# Patient Record
Sex: Female | Born: 1950 | Hispanic: No | Smoking: Former smoker
Health system: Southern US, Community
[De-identification: ages and names within clinical notes are randomized; demographics above are authoritative.]

## PROBLEM LIST (undated history)

## (undated) DIAGNOSIS — E669 Obesity, unspecified: Secondary | ICD-10-CM

## (undated) DIAGNOSIS — G250 Essential tremor: Secondary | ICD-10-CM

## (undated) DIAGNOSIS — L649 Androgenic alopecia, unspecified: Secondary | ICD-10-CM

## (undated) DIAGNOSIS — J309 Allergic rhinitis, unspecified: Secondary | ICD-10-CM

## (undated) HISTORY — DX: Androgenic alopecia, unspecified: L64.9

## (undated) HISTORY — DX: Obesity, unspecified: E66.9

## (undated) HISTORY — DX: Allergic rhinitis, unspecified: J30.9

---

## 1998-08-09 ENCOUNTER — Other Ambulatory Visit: Admission: RE | Admit: 1998-08-09 | Discharge: 1998-08-09 | Payer: Self-pay | Admitting: Obstetrics and Gynecology

## 2006-02-28 ENCOUNTER — Encounter: Payer: Self-pay | Admitting: Internal Medicine

## 2007-11-21 ENCOUNTER — Encounter: Payer: Self-pay | Admitting: Internal Medicine

## 2007-11-21 LAB — CONVERTED CEMR LAB
Albumin: 4.5 g/dL
BUN: 19 mg/dL
Eosinophils Relative: 2.2 %
Glucose, Bld: 98 mg/dL
HCT: 40.8 %
Hemoglobin: 13.7 g/dL
Monocytes Relative: 5 %
Potassium: 4.6 meq/L
RDW: 15.1 %
Sodium: 139 meq/L
Total Protein: 7.1 g/dL

## 2008-11-12 HISTORY — PX: OTHER SURGICAL HISTORY: SHX169

## 2008-12-03 ENCOUNTER — Encounter: Payer: Self-pay | Admitting: Internal Medicine

## 2008-12-10 ENCOUNTER — Ambulatory Visit (HOSPITAL_BASED_OUTPATIENT_CLINIC_OR_DEPARTMENT_OTHER): Admission: RE | Admit: 2008-12-10 | Discharge: 2008-12-10 | Payer: Self-pay | Admitting: Orthopedic Surgery

## 2008-12-10 ENCOUNTER — Encounter (INDEPENDENT_AMBULATORY_CARE_PROVIDER_SITE_OTHER): Payer: Self-pay | Admitting: Orthopedic Surgery

## 2009-12-28 ENCOUNTER — Encounter: Payer: Self-pay | Admitting: Internal Medicine

## 2009-12-28 LAB — CONVERTED CEMR LAB
BUN: 19 mg/dL
Chloride: 102 meq/L
Creatinine, Ser: 0.8 mg/dL
Potassium: 4.5 meq/L

## 2010-06-14 ENCOUNTER — Encounter: Payer: Self-pay | Admitting: Internal Medicine

## 2010-09-13 ENCOUNTER — Encounter: Payer: Self-pay | Admitting: Internal Medicine

## 2010-09-13 LAB — CONVERTED CEMR LAB
Cholesterol: 289 mg/dL
LDL Cholesterol: 149 mg/dL

## 2010-10-23 ENCOUNTER — Ambulatory Visit: Payer: Self-pay | Admitting: Internal Medicine

## 2010-10-23 DIAGNOSIS — J309 Allergic rhinitis, unspecified: Secondary | ICD-10-CM

## 2010-10-23 DIAGNOSIS — Z9189 Other specified personal risk factors, not elsewhere classified: Secondary | ICD-10-CM | POA: Insufficient documentation

## 2010-10-26 ENCOUNTER — Encounter: Payer: Self-pay | Admitting: Internal Medicine

## 2010-11-02 ENCOUNTER — Telehealth: Payer: Self-pay | Admitting: Internal Medicine

## 2010-12-14 NOTE — Assessment & Plan Note (Signed)
Summary: NEW [employer] PT  PKG  #  STC   Vital Signs:  Patient profile:   60 year old female Height:      67 inches (170.18 cm) Weight:      190.12 pounds (86.42 kg) BMI:     29.88 O2 Sat:      95 % on Room air Temp:     98.4 degrees F (36.89 degrees C) oral Pulse rate:   64 / minute BP sitting:   110 / 72  (left arm) Cuff size:   regular  Vitals Entered By: Orlan Leavens RMA (October 23, 2010 1:56 PM)  O2 Flow:  Room air CC: New patient Is Patient Diabetic? No Pain Assessment Patient in pain? no        Primary Care Provider:  Newt Lukes MD  CC:  New patient.  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- rx'd rogaine then propecia- continues to take propecia as rogaine too messy -   overweight - follows low fat diet for same and exercises 3-4x/wk (aerobic as well as weight training) - compliance complicated by travel for employment and "enjoyment" of food  Preventive Screening-Counseling & Management  Alcohol-Tobacco     Alcohol drinks/day: <1     Alcohol type: wine     Alcohol Counseling: not indicated; use of alcohol is not excessive or problematic     Smoking Status: quit     Year Quit: 1980s     Tobacco Counseling: to remain off tobacco products  Caffeine-Diet-Exercise     Does Patient Exercise: yes     Exercise Counseling: not indicated; exercise is adequate     Depression Counseling: not indicated; screening negative for depression  Safety-Violence-Falls     Seat Belt Counseling: not indicated; patient wears seat belts     Helmet Counseling: not indicated; patient wears helmet when riding bicycle/motocycle     Firearm  Counseling: not applicable     Violence Counseling: not applicable     Fall Risk Counseling: not indicated; no significant falls noted  Current Medications (verified): 1)  Omega-3 350 Mg Caps (Omega-3 Fatty Acids) .... Take 1 By Mouth Once Daily 2)  B Complex 100  Tabs (B Complex Vitamins) .... Take 1 By Mouth Once Daily 3)  Biotin 300 Mcg Tabs (Biotin) .... Take 1 By Mouth Once Daily 4)  Propecia 1 Mg Tabs (Finasteride) .... 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  I have reviewed all other systems and they were negative.   Physical Exam  General:  overweight-appearing.  alert, well-developed, well-nourished, and cooperative to examination.    Head:  Normocephalic and atraumatic without obvious abnormalities. No apparent alopecia or balding. Eyes:  vision grossly intact; pupils equal, round and reactive to light.  conjunctiva and lids normal.    Ears:  normal pinnae bilaterally, without erythema, swelling, or tenderness to palpation. TMs clear, without effusion, or cerumen impaction. Hearing grossly normal bilaterally  Mouth:  teeth and gums in good repair; mucous membranes moist, without lesions or ulcers. oropharynx clear without exudate, no erythema.  Neck:  supple, full ROM, no masses, no  thyromegaly; no thyroid nodules or tenderness. no JVD or carotid bruits.   Lungs:  normal respiratory effort, no intercostal retractions or use of accessory muscles; normal breath sounds bilaterally - no crackles and no wheezes.    Heart:  normal rate, regular rhythm, no murmur, and no rub. BLE without edema.  Neurologic:  alert & oriented X3 and cranial nerves II-XII symetrically intact.  strength normal in all extremities, sensation intact to light touch, and gait normal. speech fluent without dysarthria or aphasia; follows commands with good comprehension.  Skin:  no rashes, vesicles, ulcers, or erythema. No nodules or irregularity to palpation.  Psych:  Oriented X3, memory intact for recent and remote, normally interactive, good eye contact, not anxious appearing, not depressed appearing, and not agitated.      Impression & Recommendations:  Problem # 1:  ALLERGIC RHINITIS (ICD-477.9)  Her updated medication list for this problem includes:    Claritin 10 Mg Tabs (Loratadine) .Marland Kitchen... 1 by mouth once daily  Problem # 2:  SPECIAL SCREENING FOR MALIGNANT NEOPLASMS COLON (ICD-V76.51)  Orders: Gastroenterology Referral (GI)  Problem # 3:  UNSPECIFIED BREAST SCREENING (ICD-V76.10)  Orders: Misc. Referral (Misc. Ref)  Problem # 4:  SPECIAL SCREENING FOR OSTEOPOROSIS (ICD-V82.81)  Orders: Radiology Referral (Radiology)  Complete Medication List: 1)  Omega-3 350 Mg Caps (Omega-3 fatty acids) .... Take 1 by mouth once daily 2)  B Complex 100 Tabs (B complex vitamins) .... Take 1 by mouth once daily 3)  Biotin 300 Mcg Tabs (Biotin) .... Take 1 by mouth once daily 4)  Propecia 1 Mg Tabs (Finasteride) .... Take 1 by mouth once daily 5)  Claritin 10 Mg Tabs (Loratadine) .Marland Kitchen.. 1 by mouth once daily 6)  Ambien 10 Mg Tabs (Zolpidem tartrate) .Marland Kitchen.. 1 by mouth at bedtime as needed  Patient Instructions: 1)  it was good to see you today. 2)  past history and medications reviewed today - no changes  suggested at this time 3)  will send for prior [facility] as discussed - 4)  we'll make referral for mammo, dexa bone density and colonoscopy. Our office will contact you regarding these appointment once made.  5)  Please schedule a follow-up appointment annually for physcial and labs, call sooner if problems.    Orders Added: 1)  New Patient Level II [99202] 2)  Gastroenterology Referral [GI] 3)  Misc. Referral [Misc. Ref] 4)  Radiology Referral [Radiology]

## 2010-12-14 NOTE — Letter (Signed)
[facility]   Imported By: Sherian Rein 11/17/2010 14:52:16  _____________________________________________________________________  External Attachment:    Type:   Image     Comment:   External Document

## 2010-12-14 NOTE — Letter (Signed)
[facility]: [facility]   Imported By: Sherian Rein 11/17/2010 14:54:00  _____________________________________________________________________  External Attachment:    Type:   Image     Comment:   External Document

## 2010-12-14 NOTE — Progress Notes (Signed)
Summary: Diet pill?  Phone Note Call from Patient Call back at [facility] Phone [phone]   Caller: Patient [phone] Summary of Call: Pt called stating she discussed with VAL at OV the possibility of starting on a diet pill but has forgotten the name. Pt called to infrom MD that she is interested in using pill and is requesting Rx [facility] Initial call taken by: Margaret Pyle, CMA,  November 02, 2010 10:41 AM  Follow-up for Phone Call        we discussed phentermine - can pick up 30d rx (unable to call in or fax due to controlled subst status) - take as directed for appetitie suppression, stop med and call if problems or SE - if pt wishes to continue sameafter 30d supply gone, will need OV for weigh in and med review before any refills will be provided - thanks Follow-up by: Newt Lukes MD,  November 02, 2010 11:13 AM  Additional Follow-up for Phone Call Additional follow up Details #1::        pt advised Rx in cabinet for pt pick up Additional Follow-up by: Margaret Pyle, CMA,  November 02, 2010 11:23 AM    New/Updated Medications: PHENTERMINE HCL 37.5 MG CAPS (PHENTERMINE HCL) 1 by mouth once daily Prescriptions: PHENTERMINE HCL 37.5 MG CAPS (PHENTERMINE HCL) 1 by mouth once daily  #30 x 0   Entered and Authorized by:   Newt Lukes MD   Signed by:   Newt Lukes MD on 11/02/2010   Method used:   Print then Give to Patient   RxID:   [id number]

## 2010-12-14 NOTE — Progress Notes (Signed)
Summary: Rx refill req  Phone Note Refill Request Message from:  Patient on November 02, 2010 11:27 AM  Refills Requested: Medication #1:  AMBIEN 10 MG TABS 1 by mouth at bedtime as needed   Dosage confirmed as above?Dosage Confirmed   Supply Requested: 3 months   Notes: [facility] [address]  Method Requested: Electronic Initial call taken by: Margaret Pyle, CMA,  November 02, 2010 11:28 AM  Follow-up for Phone Call        ok to send #30 with 3 refills - thanks Follow-up by: Newt Lukes MD,  November 02, 2010 11:56 AM    Prescriptions: AMBIEN 10 MG TABS (ZOLPIDEM TARTRATE) 1 by mouth at bedtime as needed  #30 x 3   Entered and Authorized by:   Margaret Pyle, CMA   Signed by:   Margaret Pyle, CMA on 11/02/2010   Method used:   Telephoned to ...       [facility]  [phone]* (retail)       [address] [place], [place]  [address]       Ph: [phone] or [phone]       Fax: [phone]   RxID:   [id number] AMBIEN 10 MG TABS (ZOLPIDEM TARTRATE) 1 by mouth at bedtime as needed  #30 x 3   Entered by:   Margaret Pyle, CMA   Authorized by:   Newt Lukes MD   Signed by:   Margaret Pyle, CMA on 11/02/2010   Method used:   Printed then faxed to ...       [facility]  [phone]* (retail)       [address] [place], [place]  [address]       Ph: [phone] or [phone]       Fax: [phone]   RxID:   [id number]

## 2011-01-01 ENCOUNTER — Ambulatory Visit: Payer: Self-pay | Admitting: Internal Medicine

## 2011-01-05 ENCOUNTER — Encounter (INDEPENDENT_AMBULATORY_CARE_PROVIDER_SITE_OTHER): Payer: Self-pay | Admitting: *Deleted

## 2011-01-05 ENCOUNTER — Ambulatory Visit (INDEPENDENT_AMBULATORY_CARE_PROVIDER_SITE_OTHER): Payer: Managed Care, Other (non HMO) | Admitting: Internal Medicine

## 2011-01-05 ENCOUNTER — Encounter: Payer: Self-pay | Admitting: Internal Medicine

## 2011-01-05 DIAGNOSIS — E669 Obesity, unspecified: Secondary | ICD-10-CM

## 2011-01-05 DIAGNOSIS — E663 Overweight: Secondary | ICD-10-CM | POA: Insufficient documentation

## 2011-01-05 DIAGNOSIS — J309 Allergic rhinitis, unspecified: Secondary | ICD-10-CM

## 2011-01-09 ENCOUNTER — Encounter: Payer: Self-pay | Admitting: Internal Medicine

## 2011-01-09 NOTE — Assessment & Plan Note (Signed)
Summary: FU ON WEIGHT /NWS #   Vital Signs:  Patient profile:   60 year old female Weight:      181.0 pounds O2 Sat:      96 % on Room air Temp:     98.2 degrees F oral Pulse rate:   74 / minute BP sitting:   110 / 84  (left arm)  Vitals Entered By: Orlan Leavens RMA (January 05, 2011 9:33 AM)  O2 Flow:  Room air CC: follow-up visit Is Patient Diabetic? No Pain Assessment Patient in pain? no        Primary Care Provider:  Newt Lukes MD  CC:  follow-up visit.  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- rx'd rogaine then propecia- continues to take propecia as rogaine too messy -   overweight - follows low fat diet for same and exercises 3-4x/wk (aerobic as well as weight training) - compliance complicated by travel for employment and "enjoyment" of food  Clinical Review Panels:  Lipid Management   Cholesterol:  291 (11/21/2007)   LDL (bad choesterol):  140 (11/21/2007)   HDL (good cholesterol):  142 (11/21/2007)   Triglycerides:  43 (11/21/2007)  CBC   WBC:  5.3 (11/21/2007)   RBC:  4.59 (11/21/2007)   Hgb:  13.7 (11/21/2007)   Hct:  40.8 (11/21/2007)   Platelets:  343 (11/21/2007)   MCV  88.9 (11/21/2007)   RDW  15.1 (11/21/2007)   PMN:  64 (11/21/2007)   Monos:  5.0 (11/21/2007)   Eosinophils:  2.2 (11/21/2007)   Basophil:  0.4 (11/21/2007)  Complete Metabolic Panel   Glucose:  94 (12/28/2009)   Sodium:  138 (12/28/2009)   Potassium:  4.5 (12/28/2009)   Chloride:  102 (12/28/2009)  CO2:  29 (12/28/2009)   BUN:  19 (12/28/2009)   Creatinine:  0.8 (12/28/2009)   Albumin:  4.5 (11/21/2007)   Total Protein:  7.1 (11/21/2007)   Calcium:  10.1 (12/28/2009)   Total Bili:  0.9 (11/21/2007)   Alk Phos:  51 (11/21/2007)   SGPT (ALT):  26 (11/21/2007)   SGOT (AST):  20 (11/21/2007)   Current Medications (verified): 1)  Omega-3 350 Mg Caps (Omega-3 Fatty Acids) .... Take 1 By Mouth Once Daily 2)  B Complex 100  Tabs (B Complex Vitamins) .... Take 1 By Mouth Once Daily 3)  Biotin 300 Mcg Tabs (Biotin) .... Take 1 By Mouth Once Daily 4)  Claritin 10 Mg Tabs (Loratadine) .Marland Kitchen.. 1 By Mouth Once Daily 5)  Ambien 10 Mg Tabs (Zolpidem Tartrate) .Marland Kitchen.. 1 By Mouth At Bedtime As Needed 6)  Phentermine Hcl 37.5 Mg Caps (Phentermine Hcl) .Marland Kitchen.. 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    Physical Exam  General:  overweight-appearing.  alert, well-developed, well-nourished, and cooperative to examination.    Lungs:  normal respiratory effort, no intercostal retractions or use of accessory muscles; normal breath sounds bilaterally - no crackles and no wheezes.    Heart:  normal rate, regular rhythm, no murmur, and no rub. BLE without edema.  Psych:  Oriented X3, memory intact for recent and remote, normally interactive, good eye contact, not anxious appearing, not depressed appearing, and not agitated.      Impression & Recommendations:  Problem # 1:  OBESITY (ICD-278.00)  Ht: 67 (10/23/2010)   Wt: 181.0 (01/05/2011)   BMI: 29.88 (10/23/2010)  started phentermine 11/03/11 - down 9# from start (190#) tol well - cont same - refill reviewed need for continued diet/exercise lifestyle change to sustain change max 3 mo rx  reviewed - pt understands and agrees Time spent with patient 25 minutes, more than 50% of this time was spent counseling patient on weight loss, med tx and travel ideas to encourage compliance  Problem # 2:  ALLERGIC RHINITIS (ICD-477.9) rec nasal sailne as needed as well for dry nose symptoms with weather and heated air Her updated medication list for this problem includes:    Claritin 10 Mg Tabs (Loratadine) .Marland Kitchen... 1 by mouth once daily  Complete Medication List: 1)  Omega-3 350 Mg Caps (Omega-3 fatty acids) .... Take 1 by mouth once daily 2)  B Complex 100 Tabs (B complex vitamins) .... Take 1 by mouth once daily 3)  Biotin 300 Mcg Tabs (Biotin) .... Take 1 by mouth once daily 4)  Claritin 10 Mg Tabs (Loratadine) .Marland Kitchen.. 1 by mouth once daily 5)  Ambien 10 Mg Tabs (Zolpidem tartrate) .Marland Kitchen.. 1 by mouth at bedtime as needed 6)  Phentermine Hcl 37.5 Mg Caps (Phentermine hcl) .Marland Kitchen.. 1 by mouth once daily  Patient Instructions: 1)  it was good to see you today. 2)  continue medication as discussed and keep up the good work - refill provided today - call if problems 3)  use nasal saline spray as needed for dryness 4)  will send to [facility] for records 5)  Please schedule a follow-up appointment in 1-2 months to review weightloss and med treatment, sooner if problems.  Prescriptions: PHENTERMINE HCL 37.5 MG CAPS (PHENTERMINE HCL) 1 by mouth once daily  #30 x 0   Entered and Authorized by:   Newt Lukes MD   Signed by:   Newt Lukes MD on 01/05/2011   Method used:   Print then Give to Patient   RxID:   [id number]    Orders Added: 1)  Est. Patient Level IV [30865]

## 2011-01-09 NOTE — Letter (Signed)
Summary: Referral - not able to see patient  [facility]  [address] [place], [place] [address]   Phone: [phone]  Fax: [phone]    January 05, 2011    Vikki Ports A. Felicity Coyer, M.D. [address] [place], [place] [address]    Re:   Heather Rivera DOB:  [birth year] MRN:   [id number]    Dear Dr. Felicity Coyer:  Thank you for your kind referral of the above patient.  We have attempted to schedule the recommended procedure Screening Colonoscopy but have not been able to schedule because:   X  The patient was not available by phone and/or has not returned our calls.  ___ The patient declined to schedule the procedure at this time.  We appreciate the referral and hope that we will have the opportunity to treat this patient in the future.    Sincerely,    [facility] [phone]

## 2011-02-22 ENCOUNTER — Encounter: Payer: Self-pay | Admitting: Internal Medicine

## 2011-02-26 LAB — POCT HEMOGLOBIN-HEMACUE: Hemoglobin: 12.9 g/dL (ref 12.0–15.0)

## 2011-02-27 ENCOUNTER — Encounter: Payer: Self-pay | Admitting: Internal Medicine

## 2011-02-27 ENCOUNTER — Ambulatory Visit (INDEPENDENT_AMBULATORY_CARE_PROVIDER_SITE_OTHER): Payer: Managed Care, Other (non HMO) | Admitting: Internal Medicine

## 2011-02-27 VITALS — BP 112/82 | HR 82 | Temp 97.5°F | Ht 69.0 in | Wt 175.0 lb

## 2011-02-27 DIAGNOSIS — E669 Obesity, unspecified: Secondary | ICD-10-CM

## 2011-02-27 MED ORDER — PHENTERMINE HCL 37.5 MG PO TABS: 37.5000 mg | ORAL_TABLET | Freq: Every day | ORAL | Status: DC

## 2011-02-27 NOTE — Assessment & Plan Note (Signed)
started phentermine 11/03/11 - down 15# from start (190#) tol well - cont same - refill reviewed need for continued diet/exercise lifestyle change to sustain change max 3 mo rx reviewed - pt understands and agrees Time spent with patient 25 minutes, more than 50% of this time was spent counseling patient on weight loss, med tx and travel ideas to encourage compliance

## 2011-02-27 NOTE — Progress Notes (Signed)
  Subjective:    Patient ID: Heather Rivera, female    DOB: 01-02-1951, 60 y.o.   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 OBESITY   . Female pattern baldness   . ALLERGIC RHINITIS     Review of Systems  Respiratory: Negative for shortness of breath.   Cardiovascular: Negative for chest pain.  Neurological: Negative for numbness.       Objective:   Physical Exam  Constitutional: She appears well-developed and well-nourished. No distress.  Cardiovascular: Normal rate, regular rhythm and normal heart sounds.   No murmur heard. Pulmonary/Chest: Effort normal and breath sounds normal. No respiratory distress. She has no wheezes.   BP 112/82  Pulse 82  Temp(Src) 97.5 F (36.4 C) (Oral)  Ht 5\' 9"  (1.753 m)  Wt 175 lb (79.379 kg)  BMI 25.84 kg/m2  SpO2 99% Wt Readings from Last 3 Encounters:  02/27/11 175 lb (79.379 kg)  01/05/11 181 lb (82.101 kg)  10/23/10 190 lb 1.9 oz (86.237 kg)        Assessment & Plan:  See problem list. Medications and labs reviewed today.

## 2011-02-27 NOTE — Patient Instructions (Signed)
It was good to see you today. refill on medication today - call if problems - keep up the good work Please schedule followup in 6-12 months, call sooner if problems. Schedule your PAP/pelvic - let us know if you need referral

## 2011-03-01 ENCOUNTER — Encounter: Payer: Self-pay | Admitting: Internal Medicine

## 2011-03-27 NOTE — Op Note (Signed)
Heather Rivera, Heather Rivera               ACCOUNT NO.:  [id number]   MEDICAL RECORD NO.:  [id number]          PATIENT TYPE:  AMB   LOCATION:  [facility]                          FACILITY:  [facility]   PHYSICIAN:  Cindee Salt, M.D.       DATE OF BIRTH:  Mar 01, 1951   DATE OF PROCEDURE:  12/10/2008  DATE OF DISCHARGE:                               OPERATIVE REPORT   PREOPERATIVE DIAGNOSIS:  Foreign body granuloma right index finger.   POSTOPERATIVE DIAGNOSIS:  Foreign body granuloma right index finger.   OPERATION:  Excision foreign body granuloma right index finger.   SURGEON:  Cindee Salt, MD   ANESTHESIA:  Forearm based IV regional with supplementation.   ANESTHESIOLOGIST:  Burna Forts, MD   HISTORY:  The patient is a 60 year old female with a history of a  swollen area of her index finger following a laceration with breakage of  a light bulb which she was attempting to place at Christmas.  X-rays  reveal a retained foreign body piece of glass.  She is desirous of  having this excised.  Pre, peri and postoperative course have been  discussed along with risks and complications.  She is aware that there  is no guarantee with surgery, possibility of infection, recurrence  injury to arteries, nerves, tendons, incomplete relief of symptoms, and  dystrophy.  In the preoperative area, the patient is seen.  The  extremity marked by both the patient and surgeon.  Antibiotic given.   PROCEDURE:  The patient is brought to the operating room where forearm  based IV regional anesthesia was carried out without difficulty.  Under  the direction of Dr. Jacklynn Bue, she was prepped using DuraPrep, supine  position, right arm free.  A time-out was taken confirming the patient  and procedure.  Following this, an oblique incision was made directly  over the area of the mass and carried down through subcutaneous tissue.  A piece of glass was immediately encountered.  This had paint on  portions of it.  This was  removed without difficulty.  A foreign body  granuloma had formed around the base of it with blunt and sharp  dissection and this was dissected free  identifying the neurovascular  structure which was protected.  It was external to the flexor tendons.  This was removed in toto and sent to pathology.  The wound was copiously  irrigated with saline.  The lateral portions of the wound were closed  with interrupted 4-0 Vicryl sutures.  A metacarpal block was given with  0.25% Marcaine without epinephrine.  A sterile compressive dressing and  splint to the finger applied.  The patient tolerated the procedure well.  On deflation of the tourniquet, the finger immediately pinked.  She was  taken to the recovery room for observation in satisfactory condition.  She will be discharged home to return to the [facility]  in 1 week on Vicodin.           ______________________________  Cindee Salt, M.D.    GK/MEDQ  D:  12/10/2008  T:  12/11/2008  Job:  [id number] 

## 2011-09-24 ENCOUNTER — Telehealth: Payer: Self-pay | Admitting: *Deleted

## 2011-09-24 MED ORDER — ZOLPIDEM TARTRATE 10 MG PO TABS: 10.0000 mg | ORAL_TABLET | Freq: Every evening | ORAL | Status: DC | PRN

## 2011-09-24 NOTE — Telephone Encounter (Signed)
Ok refill done 

## 2011-09-24 NOTE — Telephone Encounter (Signed)
Zolpidem request [last refill 12.22.11 #30x3 last OV 04.17.12]

## 2011-12-10 ENCOUNTER — Other Ambulatory Visit: Payer: Self-pay | Admitting: Endocrinology

## 2011-12-10 ENCOUNTER — Encounter: Payer: Self-pay | Admitting: Internal Medicine

## 2011-12-10 ENCOUNTER — Other Ambulatory Visit: Payer: Self-pay | Admitting: Internal Medicine

## 2011-12-10 ENCOUNTER — Ambulatory Visit (INDEPENDENT_AMBULATORY_CARE_PROVIDER_SITE_OTHER): Payer: Managed Care, Other (non HMO) | Admitting: Internal Medicine

## 2011-12-10 VITALS — BP 104/72 | HR 80 | Temp 97.8°F | Resp 20 | Wt 180.5 lb

## 2011-12-10 DIAGNOSIS — J209 Acute bronchitis, unspecified: Secondary | ICD-10-CM

## 2011-12-10 MED ORDER — MOXIFLOXACIN HCL 400 MG PO TABS: 400.0000 mg | ORAL_TABLET | Freq: Every day | ORAL | Status: DC

## 2011-12-10 MED ORDER — PSEUDOEPHEDRINE-CODEINE-GG 30-10-100 MG/5ML PO SOLN: 10.0000 mL | Freq: Four times a day (QID) | ORAL | Status: AC | PRN

## 2011-12-10 MED ORDER — BENZONATATE 200 MG PO CAPS
200.0000 mg | ORAL_CAPSULE | Freq: Three times a day (TID) | ORAL | Status: DC | PRN
Start: 2011-12-10 — End: 2011-12-10

## 2011-12-10 MED ORDER — LEVOFLOXACIN 500 MG PO TABS: 500.0000 mg | ORAL_TABLET | Freq: Every day | ORAL | Status: AC

## 2011-12-10 NOTE — Patient Instructions (Signed)

## 2011-12-10 NOTE — Telephone Encounter (Signed)
Tessalon as needed - erx done - if fever or worsening symptoms - needs OV to determine if antibiotics necessary - thanks

## 2011-12-10 NOTE — Progress Notes (Signed)
Subjective:    Patient ID: Heather Rivera, female    DOB: Jun 02, 1951, 61 y.o.   MRN: [id number]  URI  This is a new problem. Episode onset: for 2 weeks. The problem has been gradually worsening. There has been no fever. Associated symptoms include congestion, coughing, rhinorrhea and sinus pain. Pertinent negatives include no abdominal pain, chest pain, diarrhea, dysuria, ear pain, headaches, joint pain, joint swelling, nausea, neck pain, plugged ear sensation, rash, sneezing, sore throat, swollen glands, vomiting or wheezing. She has tried nothing for the symptoms.      Review of Systems  Constitutional: Negative for fever, chills, diaphoresis, activity change, appetite change, fatigue and unexpected weight change.  HENT: Positive for nosebleeds, congestion, rhinorrhea, postnasal drip and sinus pressure. Negative for ear pain, sore throat, facial swelling, sneezing, trouble swallowing, neck pain and voice change.   Eyes: Negative.   Respiratory: Positive for cough. Negative for chest tightness, shortness of breath, wheezing and stridor.   Cardiovascular: Negative for chest pain, palpitations and leg swelling.  Gastrointestinal: Negative for nausea, vomiting, abdominal pain, diarrhea, constipation and anal bleeding.  Genitourinary: Negative for dysuria, urgency, frequency, hematuria, flank pain, decreased urine volume, enuresis, difficulty urinating and dyspareunia.  Musculoskeletal: Negative for myalgias, back pain, joint pain, joint swelling, arthralgias and gait problem.  Skin: Negative for color change, pallor, rash and wound.  Neurological: Negative for dizziness, tremors, seizures, syncope, facial asymmetry, speech difficulty, weakness, light-headedness, numbness and headaches.  Hematological: Negative for adenopathy. Does not bruise/bleed easily.  Psychiatric/Behavioral: Negative.        Objective:   Physical Exam  Vitals reviewed. Constitutional: She is oriented to person, place,  and time. She appears well-developed and well-nourished. No distress.  HENT:  Head: Normocephalic and atraumatic. No trismus in the jaw.  Right Ear: Hearing, tympanic membrane, external ear and ear canal normal.  Left Ear: Hearing, tympanic membrane, external ear and ear canal normal.  Nose: Mucosal edema and rhinorrhea present. No nose lacerations, sinus tenderness, nasal deformity, septal deviation or nasal septal hematoma. No epistaxis.  No foreign bodies. Right sinus exhibits maxillary sinus tenderness. Right sinus exhibits no frontal sinus tenderness. Left sinus exhibits maxillary sinus tenderness. Left sinus exhibits no frontal sinus tenderness.  Mouth/Throat: Uvula is midline, oropharynx is clear and moist and mucous membranes are normal. Mucous membranes are not pale, not dry and not cyanotic. No uvula swelling. No oropharyngeal exudate, posterior oropharyngeal edema, posterior oropharyngeal erythema or tonsillar abscesses.  Eyes: Conjunctivae are normal. Right eye exhibits no discharge. Left eye exhibits no discharge. No scleral icterus.  Neck: Normal range of motion. Neck supple. No JVD present. No tracheal deviation present. No thyromegaly present.  Cardiovascular: Normal rate, regular rhythm, normal heart sounds and intact distal pulses.  Exam reveals no gallop and no friction rub.   No murmur heard. Pulmonary/Chest: Effort normal and breath sounds normal. No stridor. No respiratory distress. She has no wheezes. She has no rales. She exhibits no tenderness.  Abdominal: Soft. Bowel sounds are normal. She exhibits no distension and no mass. There is no tenderness. There is no rebound and no guarding.  Musculoskeletal: Normal range of motion. She exhibits no edema and no tenderness.  Lymphadenopathy:    She has no cervical adenopathy.  Neurological: She is oriented to person, place, and time.  Skin: Skin is warm and dry. No rash noted. She is not diaphoretic. No erythema. No pallor.    Psychiatric: She has a normal mood and affect. Her behavior is normal. Judgment and  thought content normal.          Assessment & Plan:

## 2011-12-10 NOTE — Telephone Encounter (Signed)
Notified Heather Rivera with md response. Heather Rivera states she has made appt to see Dr. Yetta Barre this pm since she is leaving going out of town tomorrow...12/10/11@3 :23pm/LMB

## 2011-12-10 NOTE — Telephone Encounter (Signed)
The pt called and stated she has had a cough for two weeks.  She is hoping something can be called into the pharmacy for cough symptoms.    Thanks!

## 2011-12-10 NOTE — Assessment & Plan Note (Signed)
Start avelox for the infection and a symptom reliever

## 2012-01-04 ENCOUNTER — Ambulatory Visit: Payer: Managed Care, Other (non HMO) | Admitting: Internal Medicine

## 2012-01-04 DIAGNOSIS — Z0289 Encounter for other administrative examinations: Secondary | ICD-10-CM

## 2012-07-28 ENCOUNTER — Other Ambulatory Visit: Payer: Self-pay | Admitting: General Practice

## 2012-07-28 MED ORDER — ZOLPIDEM TARTRATE 10 MG PO TABS: 10.0000 mg | ORAL_TABLET | Freq: Every evening | ORAL | Status: DC | PRN

## 2012-07-28 NOTE — Telephone Encounter (Signed)
Received refill request from [facility]  request refill for ZOLPIDEM TARTRATE 10mg  . Rx last written 09-24-11 and pt last seen 12/10/11  . Please advise. Thanks

## 2012-07-28 NOTE — Telephone Encounter (Signed)
ok 

## 2012-07-29 NOTE — Telephone Encounter (Signed)
Rx faxed to pharmacy  

## 2012-09-23 LAB — LIPID PANEL: Cholesterol: 277 mg/dL — AB (ref 0–200)

## 2013-04-14 ENCOUNTER — Telehealth: Payer: Self-pay | Admitting: *Deleted

## 2013-04-14 DIAGNOSIS — Z0000 Encounter for general adult medical examination without abnormal findings: Secondary | ICD-10-CM

## 2013-04-14 NOTE — Telephone Encounter (Signed)
Received staff msg pt made cpx entering cpx labs...lmb 

## 2013-04-14 NOTE — Telephone Encounter (Signed)
Message copied by Deatra James on Tue Apr 14, 2013  1:28 PM ------      Message from: Livingston Diones      Created: Tue Apr 14, 2013  9:25 AM       Pt has a cpe 06/08/13. Please put in lab work a week prior.  ------

## 2013-06-08 ENCOUNTER — Encounter: Payer: Self-pay | Admitting: Internal Medicine

## 2013-06-08 ENCOUNTER — Ambulatory Visit (INDEPENDENT_AMBULATORY_CARE_PROVIDER_SITE_OTHER): Payer: Managed Care, Other (non HMO) | Admitting: Internal Medicine

## 2013-06-08 ENCOUNTER — Other Ambulatory Visit (INDEPENDENT_AMBULATORY_CARE_PROVIDER_SITE_OTHER): Payer: Managed Care, Other (non HMO)

## 2013-06-08 VITALS — BP 140/90 | HR 62 | Temp 97.2°F | Ht 69.0 in | Wt 182.0 lb

## 2013-06-08 DIAGNOSIS — E669 Obesity, unspecified: Secondary | ICD-10-CM

## 2013-06-08 DIAGNOSIS — F418 Other specified anxiety disorders: Secondary | ICD-10-CM

## 2013-06-08 DIAGNOSIS — Z1211 Encounter for screening for malignant neoplasm of colon: Secondary | ICD-10-CM

## 2013-06-08 DIAGNOSIS — Z1239 Encounter for other screening for malignant neoplasm of breast: Secondary | ICD-10-CM

## 2013-06-08 DIAGNOSIS — Z0000 Encounter for general adult medical examination without abnormal findings: Secondary | ICD-10-CM

## 2013-06-08 DIAGNOSIS — F411 Generalized anxiety disorder: Secondary | ICD-10-CM

## 2013-06-08 LAB — CBC WITH DIFFERENTIAL/PLATELET
Basophils Absolute: 0 10*3/uL (ref 0.0–0.1)
Eosinophils Absolute: 0.1 10*3/uL (ref 0.0–0.7)
Lymphocytes Relative: 27.4 % (ref 12.0–46.0)
MCHC: 33.8 g/dL (ref 30.0–36.0)
MCV: 87.9 fl (ref 78.0–100.0)
Monocytes Absolute: 0.5 10*3/uL (ref 0.1–1.0)
Neutro Abs: 4.1 10*3/uL (ref 1.4–7.7)
Neutrophils Relative %: 63 % (ref 43.0–77.0)
RDW: 13.5 % (ref 11.5–14.6)

## 2013-06-08 LAB — BASIC METABOLIC PANEL
CO2: 30 mEq/L (ref 19–32)
Calcium: 9.9 mg/dL (ref 8.4–10.5)
Chloride: 100 mEq/L (ref 96–112)
Creatinine, Ser: 0.8 mg/dL (ref 0.4–1.2)
Glucose, Bld: 95 mg/dL (ref 70–99)

## 2013-06-08 LAB — TSH: TSH: 2.74 u[IU]/mL (ref 0.35–5.50)

## 2013-06-08 LAB — HEPATIC FUNCTION PANEL
AST: 21 U/L (ref 0–37)
Alkaline Phosphatase: 46 U/L (ref 39–117)
Bilirubin, Direct: 0.1 mg/dL (ref 0.0–0.3)
Total Bilirubin: 0.9 mg/dL (ref 0.3–1.2)

## 2013-06-08 MED ORDER — PHENTERMINE HCL 37.5 MG PO TBDP: 37.5000 mg | ORAL_TABLET | Freq: Every day | ORAL | Status: DC

## 2013-06-08 NOTE — Progress Notes (Signed)
Subjective:    Patient ID: Heather Rivera, female    DOB: 02-21-1951, 62 y.o.   MRN: [id number]  HPI patient is here today for annual physical. Patient feels well overall.  Concerned about weight gain and ?resume med for appetite suppressant? Working on diet/exercise  Also increase anxiety related to employment uncertainty, discord with relationship with eldest dtr and death of mom in past 12months. infreq use of meds for sleep - declines need for other meds or counseling at this time  Past Medical History  Diagnosis Date  . OBESITY   . Female pattern baldness   . ALLERGIC RHINITIS    Family History  Problem Relation Age of Onset  . Alcohol abuse Mother   . Arthritis Other     grandparent   History  Substance Use Topics  . Smoking status: Former Smoker    Quit date: 11/12/1978  . Smokeless tobacco: Not on file     Comment: Divorced, single and lives alone. 2 grown kids. 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 Alcohol Use: No     Review of Systems Constitutional: Negative for fever or weight change.  Respiratory: Negative for cough and shortness of breath.   Cardiovascular: Negative for chest pain or palpitations.  Gastrointestinal: Negative for abdominal pain, no bowel changes.  Musculoskeletal: Negative for gait problem or joint swelling.  Skin: Negative for rash.  Neurological: Negative for dizziness or headache.  No other specific complaints in a complete review of systems (except as listed in HPI above).     Objective:   Physical Exam BP 140/90  Pulse 62  Temp(Src) 97.2 F (36.2 C) (Oral)  Ht 5\' 9"  (1.753 m)  Wt 182 lb (82.555 kg)  BMI 26.86 kg/m2  SpO2 97% Wt Readings from Last 3 Encounters:  06/08/13 182 lb (82.555 kg)  12/10/11 180 lb 8 oz (81.874 kg)  02/27/11 175 lb (79.379 kg)   Constitutional: She is overweight, but appears well-developed and well-nourished. No distress.  HENT: Head: Normocephalic and atraumatic. Ears: B  TMs Heather, no erythema or effusion; Nose: Nose normal. Mouth/Throat: Oropharynx is clear and moist. No oropharyngeal exudate.  Eyes: Conjunctivae and EOM are normal. Pupils are equal, round, and reactive to light. No scleral icterus.  Neck: Normal range of motion. Neck supple. No JVD present. No thyromegaly present.  Cardiovascular: Normal rate, regular rhythm and normal heart sounds.  No murmur heard. No BLE edema. Pulmonary/Chest: Effort normal and breath sounds normal. No respiratory distress. She has no wheezes.  Abdominal: Soft. Bowel sounds are normal. She exhibits no distension. There is no tenderness. no masses Musculoskeletal: Normal range of motion, no joint effusions. No gross deformities Neurological: She is alert and oriented to person, place, and time. No cranial nerve deficit. Coordination, balance, strength, speech and gait are normal.  Skin: Skin is warm and dry. No rash noted. No erythema.  Psychiatric: She has an anxious/dysphoric and emotional mood/affect. Her behavior is normal. Judgment and thought content normal.   Lab Results  Component Value Date   WBC 5.3 11/21/2007   HGB 12.9 12/10/2008   HCT 40.8 11/21/2007   PLT 343 11/21/2007   GLUCOSE 100 09/13/2010   CHOL 277* 09/23/2012   HDL 130* 09/23/2012   LDLCALC 149 09/13/2010   ALT 26 11/21/2007   AST 20 11/21/2007   NA 138 12/28/2009   K 4.5 12/28/2009   CL 102 12/28/2009   CREATININE 0.8 12/28/2009   BUN 19 12/28/2009   CO2  29 12/28/2009   ECG: normal sinus at 66 beats per minutes. Low voltage in precordial leads but no acute ischemic change, no arrhythmia    Assessment & Plan:   CPX/v70.0 0- Patient has been counseled on age-appropriate routine health concerns for screening and prevention. These are reviewed and up-to-date. Immunizations are up-to-date or declined. Labs and ECG reviewed.  Situational depression/anxiety - exacerbated by social and family/employment stressors and death of mom in past year. Support offered at  length. Previously in counseling, ongoing "talk therapy" and biofeedback, declines need for meds to control symptoms at this time. Pt agrees to call if worse or unimproved, and/or stop appetite suppressant if symptoms worse on stimulant therapy   Also See problem list. Medications and labs reviewed today.

## 2013-06-08 NOTE — Patient Instructions (Addendum)
It was good to see you today. We have reviewed your prior records including labs and tests today Health Maintenance reviewed - refer for screening mammography and for colonoscopy with Dr. Loreta Ave -all other recommended immunizations and age-appropriate screenings are up-to-date. Test(s) ordered today. Your results will be released to MyChart (or called to you) after review, usually within 72hours after test completion. If any changes need to be made, you will be notified at that same time. Medications reviewed and updated, okay to resume phentermine as needed to help with weight loss goals -no other changes recommended at this time.  Monitor your blood pressure and call if remains greater than 140/90 If referral needed for counseling or increasing anxiety symptoms, stop phentermine and call for further direction as needed followup in 3 months for review and blood pressure recheck, call sooner if problemsHealth Maintenance, Females A healthy lifestyle and preventative care can promote health and wellness.  Maintain regular health, dental, and eye exams.  Eat a healthy diet. Foods like vegetables, fruits, whole grains, low-fat dairy products, and lean protein foods contain the nutrients you need without too many calories. Decrease your intake of foods high in solid fats, added sugars, and salt. Get information about a proper diet from your caregiver, if necessary.  Regular physical exercise is one of the most important things you can do for your health. Most adults should get at least 150 minutes of moderate-intensity exercise (any activity that increases your heart rate and causes you to sweat) each week. In addition, most adults need muscle-strengthening exercises on 2 or more days a week.   Maintain a healthy weight. The body mass index (BMI) is a screening tool to identify possible weight problems. It provides an estimate of body fat based on height and weight. Your caregiver can help determine your  BMI, and can help you achieve or maintain a healthy weight. For adults 20 years and older:  A BMI below 18.5 is considered underweight.  A BMI of 18.5 to 24.9 is normal.  A BMI of 25 to 29.9 is considered overweight.  A BMI of 30 and above is considered obese.  Maintain normal blood lipids and cholesterol by exercising and minimizing your intake of saturated fat. Eat a balanced diet with plenty of fruits and vegetables. Blood tests for lipids and cholesterol should begin at age 57 and be repeated every 5 years. If your lipid or cholesterol levels are high, you are over 50, or you are a high risk for heart disease, you may need your cholesterol levels checked more frequently.Ongoing high lipid and cholesterol levels should be treated with medicines if diet and exercise are not effective.  If you smoke, find out from your caregiver how to quit. If you do not use tobacco, do not start.  If you are pregnant, do not drink alcohol. If you are breastfeeding, be very cautious about drinking alcohol. If you are not pregnant and choose to drink alcohol, do not exceed 1 drink per day. One drink is considered to be 12 ounces (355 mL) of beer, 5 ounces (148 mL) of wine, or 1.5 ounces (44 mL) of liquor.  Avoid use of street drugs. Do not share needles with anyone. Ask for help if you need support or instructions about stopping the use of drugs.  High blood pressure causes heart disease and increases the risk of stroke. Blood pressure should be checked at least every 1 to 2 years. Ongoing high blood pressure should be treated with medicines, if  weight loss and exercise are not effective.  If you are 73 to 64 years old, ask your caregiver if you should take aspirin to prevent strokes.  Diabetes screening involves taking a blood sample to check your fasting blood sugar level. This should be done once every 3 years, after age 75, if you are within normal weight and without risk factors for diabetes. Testing  should be considered at a younger age or be carried out more frequently if you are overweight and have at least 1 risk factor for diabetes.  Breast cancer screening is essential preventative care for women. You should practice "breast self-awareness." This means understanding the normal appearance and feel of your breasts and may include breast self-examination. Any changes detected, no matter how small, should be reported to a caregiver. Women in their 18s and 30s should have a clinical breast exam (CBE) by a caregiver as part of a regular health exam every 1 to 3 years. After age 44, women should have a CBE every year. Starting at age 22, women should consider having a mammogram (breast X-ray) every year. Women who have a family history of breast cancer should talk to their caregiver about genetic screening. Women at a high risk of breast cancer should talk to their caregiver about having an MRI and a mammogram every year.  The Pap test is a screening test for cervical cancer. Women should have a Pap test starting at age 106. Between ages 33 and 62, Pap tests should be repeated every 2 years. Beginning at age 38, you should have a Pap test every 3 years as long as the past 3 Pap tests have been normal. If you had a hysterectomy for a problem that was not cancer or a condition that could lead to cancer, then you no longer need Pap tests. If you are between ages 29 and 61, and you have had normal Pap tests going back 10 years, you no longer need Pap tests. If you have had past treatment for cervical cancer or a condition that could lead to cancer, you need Pap tests and screening for cancer for at least 20 years after your treatment. If Pap tests have been discontinued, risk factors (such as a new sexual partner) need to be reassessed to determine if screening should be resumed. Some women have medical problems that increase the chance of getting cervical cancer. In these cases, your caregiver may recommend more  frequent screening and Pap tests.  The human papillomavirus (HPV) test is an additional test that may be used for cervical cancer screening. The HPV test looks for the virus that can cause the cell changes on the cervix. The cells collected during the Pap test can be tested for HPV. The HPV test could be used to screen women aged 59 years and older, and should be used in women of any age who have unclear Pap test results. After the age of 27, women should have HPV testing at the same frequency as a Pap test.  Colorectal cancer can be detected and often prevented. Most routine colorectal cancer screening begins at the age of 49 and continues through age 46. However, your caregiver may recommend screening at an earlier age if you have risk factors for colon cancer. On a yearly basis, your caregiver may provide home test kits to check for hidden blood in the stool. Use of a small camera at the end of a tube, to directly examine the colon (sigmoidoscopy or colonoscopy), can detect the  earliest forms of colorectal cancer. Talk to your caregiver about this at age 18, when routine screening begins. Direct examination of the colon should be repeated every 5 to 10 years through age 6, unless early forms of pre-cancerous polyps or small growths are found.  Hepatitis C blood testing is recommended for all people born from [birth year] through 1967 and any individual with known risks for hepatitis C.  Practice safe sex. Use condoms and avoid high-risk sexual practices to reduce the spread of sexually transmitted infections (STIs). Sexually active women aged 16 and younger should be checked for Chlamydia, which is a common sexually transmitted infection. Older women with new or multiple partners should also be tested for Chlamydia. Testing for other STIs is recommended if you are sexually active and at increased risk.  Osteoporosis is a disease in which the bones lose minerals and strength with aging. This can result in  serious bone fractures. The risk of osteoporosis can be identified using a bone density scan. Women ages 48 and over and women at risk for fractures or osteoporosis should discuss screening with their caregivers. Ask your caregiver whether you should be taking a calcium supplement or vitamin D to reduce the rate of osteoporosis.  Menopause can be associated with physical symptoms and risks. Hormone replacement therapy is available to decrease symptoms and risks. You should talk to your caregiver about whether hormone replacement therapy is right for you.  Use sunscreen with a sun protection factor (SPF) of 30 or greater. Apply sunscreen liberally and repeatedly throughout the day. You should seek shade when your shadow is shorter than you. Protect yourself by wearing long sleeves, pants, a wide-brimmed hat, and sunglasses year round, whenever you are outdoors.  Notify your caregiver of new moles or changes in moles, especially if there is a change in shape or color. Also notify your caregiver if a mole is larger than the size of a pencil eraser.  Stay current with your immunizations. Document Released: 05/14/2011 Document Revised: 01/21/2012 Document Reviewed: 05/14/2011 [facility] Patient Information 2014 [place], [place]. Exercise to Lose Weight Exercise and a healthy diet may help you lose weight. Your doctor may suggest specific exercises. EXERCISE IDEAS AND TIPS  Choose low-cost things you enjoy doing, such as walking, bicycling, or exercising to workout videos.  Take stairs instead of the elevator.  Walk during your lunch break.  Park your car further away from work or school.  Go to a gym or an exercise class.  Start with 5 to 10 minutes of exercise each day. Build up to 30 minutes of exercise 4 to 6 days a week.  Wear shoes with good support and comfortable clothes.  Stretch before and after working out.  Work out until you breathe harder and your heart beats faster.  Drink  extra water when you exercise.  Do not do so much that you hurt yourself, feel dizzy, or get very short of breath. Exercises that burn about 150 calories:  Running 1  miles in 15 minutes.  Playing volleyball for 45 to 60 minutes.  Washing and waxing a car for 45 to 60 minutes.  Playing touch football for 45 minutes.  Walking 1  miles in 35 minutes.  Pushing a stroller 1  miles in 30 minutes.  Playing basketball for 30 minutes.  Raking leaves for 30 minutes.  Bicycling 5 miles in 30 minutes.  Walking 2 miles in 30 minutes.  Dancing for 30 minutes.  Shoveling snow for 15 minutes.  Swimming  laps for 20 minutes.  Walking up stairs for 15 minutes.  Bicycling 4 miles in 15 minutes.  Gardening for 30 to 45 minutes.  Jumping rope for 15 minutes.  Washing windows or floors for 45 to 60 minutes. Document Released: 12/01/2010 Document Revised: 01/21/2012 Document Reviewed: 12/01/2010 [facility] Patient Information 2014 [place], [place].

## 2013-06-08 NOTE — Assessment & Plan Note (Signed)
started phentermine 11/03/11 - used intermittently for 3 months - down 15# from start (190#) reviewed need for continued diet/exercise lifestyle change to sustain change Ok to resume same, max 3 mo rx reviewed - pt understands and agrees

## 2013-07-20 ENCOUNTER — Other Ambulatory Visit: Payer: Self-pay | Admitting: Internal Medicine

## 2013-07-20 NOTE — Telephone Encounter (Signed)
Notified pt rx ready for pick-up.../lmb 

## 2013-09-28 ENCOUNTER — Other Ambulatory Visit: Payer: Self-pay | Admitting: Internal Medicine

## 2013-09-28 NOTE — Telephone Encounter (Signed)
Ok to refill? Last OV 7.28.14 Last filled 7.28.14

## 2013-09-28 NOTE — Telephone Encounter (Signed)
Done hardcopy to robin  

## 2013-09-29 ENCOUNTER — Ambulatory Visit (INDEPENDENT_AMBULATORY_CARE_PROVIDER_SITE_OTHER): Payer: Managed Care, Other (non HMO) | Admitting: Family

## 2013-09-29 ENCOUNTER — Encounter: Payer: Self-pay | Admitting: Family

## 2013-09-29 VITALS — BP 120/80 | HR 81 | Temp 98.3°F | Wt 174.0 lb

## 2013-09-29 DIAGNOSIS — J069 Acute upper respiratory infection, unspecified: Secondary | ICD-10-CM

## 2013-09-29 DIAGNOSIS — J029 Acute pharyngitis, unspecified: Secondary | ICD-10-CM

## 2013-09-29 MED ORDER — METHYLPREDNISOLONE 4 MG PO KIT: PACK | ORAL | Status: AC

## 2013-09-29 NOTE — Progress Notes (Signed)
Subjective:    Patient ID: Heather Rivera, female    DOB: 07/27/1951, 62 y.o.   MRN: [id number]  HPI 62 year old white female, patient of Dr. Felicity Coyer is in today with complaints of sore throat x1 day. She has taken and he'll do help some. Accompanying symptoms include sneezing, congestion, fatigue. Reports illness and her daughter and grandchild.   Review of Systems  Constitutional: Negative.   HENT: Positive for postnasal drip, rhinorrhea, sneezing and sore throat.   Respiratory: Negative.   Cardiovascular: Negative.   Gastrointestinal: Negative.   Skin: Negative.   Allergic/Immunologic: Negative.   Hematological: Negative.  Negative for adenopathy.  Psychiatric/Behavioral: Negative.    Past Medical History  Diagnosis Date  . OBESITY   . Female pattern baldness   . ALLERGIC RHINITIS     History   Social History  . Marital Status: Divorced    Spouse Name: N/A    Number of Children: N/A  . Years of Education: N/A   Occupational History  . Not on file.   Social History Main Topics  . Smoking status: Former Smoker    Quit date: 11/12/1978  . Smokeless tobacco: Not on file     Comment: Divorced, single and lives alone. 2 grown kids. 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 Alcohol Use: No  . Drug Use: No  . Sexual Activity:    Other Topics Concern  . Not on file   Social History Narrative  . No narrative on file    Past Surgical History  Procedure Laterality Date  . Cesarean section  1982  . Glass removed from finger  2010    Family History  Problem Relation Age of Onset  . Alcohol abuse Mother   . Arthritis Mother     grandparent    No Known Allergies  Current Outpatient Prescriptions on File Prior to Visit  Medication Sig Dispense Refill  . b complex vitamins capsule Take 1 capsule by mouth daily.        . Biotin 300 MCG TABS Take by mouth daily.        Marland Kitchen BIOTIN PO Take by mouth. 10,000 daily      . Omega-3 350 MG CAPS  Take by mouth daily.        . phentermine (ADIPEX-P) 37.5 MG tablet TAKE 1 TABLET BY MOUTH EVERY DAY  30 tablet  0  . Phentermine HCl 37.5 MG TBDP Take 37.5 mg by mouth daily.  30 tablet  0  . zolpidem (AMBIEN) 10 MG tablet Take 1 tablet (10 mg total) by mouth at bedtime as needed.  30 tablet  3   No current facility-administered medications on file prior to visit.    BP 120/80  Pulse 81  Temp(Src) 98.3 F (36.8 C) (Oral)  Wt 174 lb (78.926 kg)chart    Objective:   Physical Exam  Constitutional: She is oriented to person, place, and time. She appears well-developed and well-nourished.  HENT:  Right Ear: External ear normal.  Left Ear: External ear normal.  Moderate erythema noted to the pharynx. No exudate.  Neck: Normal range of motion. Neck supple.  Cardiovascular: Normal rate, regular rhythm and normal heart sounds.   Pulmonary/Chest: Effort normal and breath sounds normal.  Lymphadenopathy:    She has no cervical adenopathy.  Neurological: She is alert and oriented to person, place, and time.  Skin: Skin is warm and dry.  Psychiatric: She has a normal mood and affect.  Assessment & Plan:  Assessment: 1. Pharyngitis 2. Upper respiratory infection  Plan: For symptom relief we'll prescribe a Medrol pack as directed. Rest. Drink plenty of fluids. Patient going off the symptoms worsen or persist. Recheck as scheduled, and as needed.

## 2013-09-29 NOTE — Patient Instructions (Signed)

## 2013-09-29 NOTE — Telephone Encounter (Signed)
Faxed hardcopy to [facility] [place] 

## 2013-10-06 ENCOUNTER — Other Ambulatory Visit: Payer: Self-pay | Admitting: Internal Medicine

## 2013-10-07 ENCOUNTER — Telehealth: Payer: Self-pay

## 2013-10-07 NOTE — Telephone Encounter (Signed)
Ambien script was sent to Pharmacy on 10/07/2013 at 11:00 am /met  To # [phone]

## 2014-03-08 ENCOUNTER — Other Ambulatory Visit: Payer: Self-pay | Admitting: Internal Medicine

## 2014-06-11 ENCOUNTER — Other Ambulatory Visit: Payer: Self-pay | Admitting: Physician Assistant

## 2015-01-31 ENCOUNTER — Encounter: Payer: Managed Care, Other (non HMO) | Admitting: Internal Medicine

## 2015-02-09 ENCOUNTER — Other Ambulatory Visit (INDEPENDENT_AMBULATORY_CARE_PROVIDER_SITE_OTHER): Payer: Managed Care, Other (non HMO)

## 2015-02-09 ENCOUNTER — Encounter: Payer: Self-pay | Admitting: Internal Medicine

## 2015-02-09 ENCOUNTER — Ambulatory Visit (INDEPENDENT_AMBULATORY_CARE_PROVIDER_SITE_OTHER): Payer: Managed Care, Other (non HMO) | Admitting: Internal Medicine

## 2015-02-09 VITALS — BP 110/76 | HR 64 | Temp 98.1°F | Resp 12 | Ht 66.0 in | Wt 180.4 lb

## 2015-02-09 DIAGNOSIS — Z0000 Encounter for general adult medical examination without abnormal findings: Secondary | ICD-10-CM

## 2015-02-09 DIAGNOSIS — Z23 Encounter for immunization: Secondary | ICD-10-CM | POA: Diagnosis not present

## 2015-02-09 DIAGNOSIS — J302 Other seasonal allergic rhinitis: Secondary | ICD-10-CM

## 2015-02-09 DIAGNOSIS — E663 Overweight: Secondary | ICD-10-CM

## 2015-02-09 LAB — TSH: TSH: 2.12 u[IU]/mL (ref 0.35–4.50)

## 2015-02-09 MED ORDER — ZOLPIDEM TARTRATE 5 MG PO TABS: 5.0000 mg | ORAL_TABLET | Freq: Every evening | ORAL | Status: DC | PRN

## 2015-02-09 MED ORDER — FLUTICASONE PROPIONATE 50 MCG/ACT NA SUSP: 2.0000 | Freq: Every day | NASAL | Status: DC

## 2015-02-09 MED ORDER — PHENTERMINE HCL 37.5 MG PO TABS: 37.5000 mg | ORAL_TABLET | Freq: Every day | ORAL | Status: DC

## 2015-02-09 NOTE — Patient Instructions (Signed)
We have sent in the flonase for the cough which is a nose spray. Use 2 puffs in each nostril once a day. It may take about 3-5 days to work to stop the drainage. You can use it while the allergies are the worst. It is also safe to use year round if you have multiple allergy seasons.   We have given you the sleeping ambien as well as the weight loss medicine.   We will check on the thyroid test today and call you back about the results.   Think about getting the colon cancer screening this year now that you are traveling less.   Health Maintenance Adopting a healthy lifestyle and getting preventive care can go a long way to promote health and wellness. Talk with your health care provider about what schedule of regular examinations is right for you. This is a good chance for you to check in with your provider about disease prevention and staying healthy. In between checkups, there are plenty of things you can do on your own. Experts have done a lot of research about which lifestyle changes and preventive measures are most likely to keep you healthy. Ask your health care provider for more information. WEIGHT AND DIET  Eat a healthy diet  Be sure to include plenty of vegetables, fruits, low-fat dairy products, and lean protein.  Do not eat a lot of foods high in solid fats, added sugars, or salt.  Get regular exercise. This is one of the most important things you can do for your health.  Most adults should exercise for at least 150 minutes each week. The exercise should increase your heart rate and make you sweat (moderate-intensity exercise).  Most adults should also do strengthening exercises at least twice a week. This is in addition to the moderate-intensity exercise.  Maintain a healthy weight  Body mass index (BMI) is a measurement that can be used to identify possible weight problems. It estimates body fat based on height and weight. Your health care provider can help determine your BMI  and help you achieve or maintain a healthy weight.  For females 74 years of age and older:   A BMI below 18.5 is considered underweight.  A BMI of 18.5 to 24.9 is normal.  A BMI of 25 to 29.9 is considered overweight.  A BMI of 30 and above is considered obese.  Watch levels of cholesterol and blood lipids  You should start having your blood tested for lipids and cholesterol at 66 years of age, then have this test every 5 years.  You may need to have your cholesterol levels checked more often if:  Your lipid or cholesterol levels are high.  You are older than 66 years of age.  You are at high risk for heart disease.  CANCER SCREENING   Lung Cancer  Lung cancer screening is recommended for adults 70-2 years old who are at high risk for lung cancer because of a history of smoking.  A yearly low-dose CT scan of the lungs is recommended for people who:  Currently smoke.  Have quit within the past 15 years.  Have at least a 30-pack-year history of smoking. A pack year is smoking an average of one pack of cigarettes a day for 1 year.  Yearly screening should continue until it has been 15 years since you quit.  Yearly screening should stop if you develop a health problem that would prevent you from having lung cancer treatment.  Breast Cancer  Practice breast self-awareness. This means understanding how your breasts normally appear and feel.  It also means doing regular breast self-exams. Let your health care provider know about any changes, no matter how small.  If you are in your 20s or 30s, you should have a clinical breast exam (CBE) by a health care provider every 1-3 years as part of a regular health exam.  If you are 48 or older, have a CBE every year. Also consider having a breast X-ray (mammogram) every year.  If you have a family history of breast cancer, talk to your health care provider about genetic screening.  If you are at high risk for breast cancer,  talk to your health care provider about having an MRI and a mammogram every year.  Breast cancer gene (BRCA) assessment is recommended for women who have family members with BRCA-related cancers. BRCA-related cancers include:  Breast.  Ovarian.  Tubal.  Peritoneal cancers.  Results of the assessment will determine the need for genetic counseling and BRCA1 and BRCA2 testing. Cervical Cancer Routine pelvic examinations to screen for cervical cancer are no longer recommended for nonpregnant women who are considered low risk for cancer of the pelvic organs (ovaries, uterus, and vagina) and who do not have symptoms. A pelvic examination may be necessary if you have symptoms including those associated with pelvic infections. Ask your health care provider if a screening pelvic exam is right for you.   The Pap test is the screening test for cervical cancer for women who are considered at risk.  If you had a hysterectomy for a problem that was not cancer or a condition that could lead to cancer, then you no longer need Pap tests.  If you are older than 65 years, and you have had normal Pap tests for the past 10 years, you no longer need to have Pap tests.  If you have had past treatment for cervical cancer or a condition that could lead to cancer, you need Pap tests and screening for cancer for at least 20 years after your treatment.  If you no longer get a Pap test, assess your risk factors if they change (such as having a new sexual partner). This can affect whether you should start being screened again.  Some women have medical problems that increase their chance of getting cervical cancer. If this is the case for you, your health care provider may recommend more frequent screening and Pap tests.  The human papillomavirus (HPV) test is another test that may be used for cervical cancer screening. The HPV test looks for the virus that can cause cell changes in the cervix. The cells collected  during the Pap test can be tested for HPV.  The HPV test can be used to screen women 22 years of age and older. Getting tested for HPV can extend the interval between normal Pap tests from three to five years.  An HPV test also should be used to screen women of any age who have unclear Pap test results.  After 66 years of age, women should have HPV testing as often as Pap tests.  Colorectal Cancer  This type of cancer can be detected and often prevented.  Routine colorectal cancer screening usually begins at 66 years of age and continues through 66 years of age.  Your health care provider may recommend screening at an earlier age if you have risk factors for colon cancer.  Your health care provider may also recommend using home test kits  to check for hidden blood in the stool.  A small camera at the end of a tube can be used to examine your colon directly (sigmoidoscopy or colonoscopy). This is done to check for the earliest forms of colorectal cancer.  Routine screening usually begins at age 40.  Direct examination of the colon should be repeated every 5-10 years through 66 years of age. However, you may need to be screened more often if early forms of precancerous polyps or small growths are found. Skin Cancer  Check your skin from head to toe regularly.  Tell your health care provider about any new moles or changes in moles, especially if there is a change in a mole's shape or color.  Also tell your health care provider if you have a mole that is larger than the size of a pencil eraser.  Always use sunscreen. Apply sunscreen liberally and repeatedly throughout the day.  Protect yourself by wearing long sleeves, pants, a wide-brimmed hat, and sunglasses whenever you are outside. HEART DISEASE, DIABETES, AND HIGH BLOOD PRESSURE   Have your blood pressure checked at least every 1-2 years. High blood pressure causes heart disease and increases the risk of stroke.  If you are  between 73 years and 43 years old, ask your health care provider if you should take aspirin to prevent strokes.  Have regular diabetes screenings. This involves taking a blood sample to check your fasting blood sugar level.  If you are at a normal weight and have a low risk for diabetes, have this test once every three years after 66 years of age.  If you are overweight and have a high risk for diabetes, consider being tested at a younger age or more often. PREVENTING INFECTION  Hepatitis B  If you have a higher risk for hepatitis B, you should be screened for this virus. You are considered at high risk for hepatitis B if:  You were born in a country where hepatitis B is common. Ask your health care provider which countries are considered high risk.  Your parents were born in a high-risk country, and you have not been immunized against hepatitis B (hepatitis B vaccine).  You have HIV or AIDS.  You use needles to inject street drugs.  You live with someone who has hepatitis B.  You have had sex with someone who has hepatitis B.  You get hemodialysis treatment.  You take certain medicines for conditions, including cancer, organ transplantation, and autoimmune conditions. Hepatitis C  Blood testing is recommended for:  Everyone born from [birth year] through 1967.  Anyone with known risk factors for hepatitis C. Sexually transmitted infections (STIs)  You should be screened for sexually transmitted infections (STIs) including gonorrhea and chlamydia if:  You are sexually active and are younger than 66 years of age.  You are older than 66 years of age and your health care provider tells you that you are at risk for this type of infection.  Your sexual activity has changed since you were last screened and you are at an increased risk for chlamydia or gonorrhea. Ask your health care provider if you are at risk.  If you do not have HIV, but are at risk, it may be recommended that you  take a prescription medicine daily to prevent HIV infection. This is called pre-exposure prophylaxis (PrEP). You are considered at risk if:  You are sexually active and do not regularly use condoms or know the HIV status of your partner(s).  You  take drugs by injection.  You are sexually active with a partner who has HIV. Talk with your health care provider about whether you are at high risk of being infected with HIV. If you choose to begin PrEP, you should first be tested for HIV. You should then be tested every 3 months for as long as you are taking PrEP.  PREGNANCY   If you are premenopausal and you may become pregnant, ask your health care provider about preconception counseling.  If you may become pregnant, take 400 to 800 micrograms (mcg) of folic acid every day.  If you want to prevent pregnancy, talk to your health care provider about birth control (contraception). OSTEOPOROSIS AND MENOPAUSE   Osteoporosis is a disease in which the bones lose minerals and strength with aging. This can result in serious bone fractures. Your risk for osteoporosis can be identified using a bone density scan.  If you are 74 years of age or older, or if you are at risk for osteoporosis and fractures, ask your health care provider if you should be screened.  Ask your health care provider whether you should take a calcium or vitamin D supplement to lower your risk for osteoporosis.  Menopause may have certain physical symptoms and risks.  Hormone replacement therapy may reduce some of these symptoms and risks. Talk to your health care provider about whether hormone replacement therapy is right for you.  HOME CARE INSTRUCTIONS   Schedule regular health, dental, and eye exams.  Stay current with your immunizations.   Do not use any tobacco products including cigarettes, chewing tobacco, or electronic cigarettes.  If you are pregnant, do not drink alcohol.  If you are breastfeeding, limit how  much and how often you drink alcohol.  Limit alcohol intake to no more than 1 drink per day for nonpregnant women. One drink equals 12 ounces of beer, 5 ounces of wine, or 1 ounces of hard liquor.  Do not use street drugs.  Do not share needles.  Ask your health care provider for help if you need support or information about quitting drugs.  Tell your health care provider if you often feel depressed.  Tell your health care provider if you have ever been abused or do not feel safe at home. Document Released: 05/14/2011 Document Revised: 03/15/2014 Document Reviewed: 09/30/2013 [facility] Patient Information 2015 [place], [place]. This information is not intended to replace advice given to you by your health care provider. Make sure you discuss any questions you have with your health care provider.

## 2015-02-09 NOTE — Progress Notes (Signed)
   Subjective:    Patient ID: Heather Rivera, female    DOB: [birth year], 64 y.o.   MRN: [id number]  HPI The patient is a 65 YO female who is coming in for a physical. She denies new problems since last visit. She is still having some post nasal drip at this time which is somewhat bothersome. She would also like refills on her weight medication. She will use it for about 1 month to jumpstart her weight loss as she is starting a diet now. She would like a refill of ambien as well to take with her when she travels.   PMH, [facility], social history reviewed and updated during today's visit.   Review of Systems  Constitutional: Negative.   HENT: Negative.   Eyes: Negative.   Respiratory: Negative.   Cardiovascular: Negative.   Gastrointestinal: Negative.   Musculoskeletal: Negative.   Skin: Negative.   Neurological: Negative.   Psychiatric/Behavioral: Negative.       Objective:   Physical Exam  Constitutional: She is oriented to person, place, and time. She appears well-developed and well-nourished.  Overweight  HENT:  Head: Normocephalic and atraumatic.  Eyes: EOM are normal.  Neck: Normal range of motion.  Cardiovascular: Normal rate and regular rhythm.   Pulmonary/Chest: Effort normal and breath sounds normal. No respiratory distress. She has no wheezes.  Abdominal: Soft. Bowel sounds are normal. She exhibits no distension. There is no tenderness.  Neurological: She is alert and oriented to person, place, and time.  Skin: Skin is warm and dry.   Filed Vitals:   02/09/15 1005  BP: 110/76  Pulse: 64  Temp: 98.1 F (36.7 C)  TempSrc: Oral  Resp: 12  Height: 5\' 6"  (1.676 m)  Weight: 180 lb 6.4 oz (81.829 kg)  SpO2: 98%      Assessment & Plan:  Given prevnar 13 at visit

## 2015-02-09 NOTE — Progress Notes (Signed)
Pre visit review using our clinic review tool, if applicable. No additional management support is needed unless otherwise documented below in the visit note. 

## 2015-02-10 ENCOUNTER — Encounter: Payer: Self-pay | Admitting: Internal Medicine

## 2015-02-10 DIAGNOSIS — Z0000 Encounter for general adult medical examination without abnormal findings: Secondary | ICD-10-CM | POA: Insufficient documentation

## 2015-02-10 NOTE — Assessment & Plan Note (Signed)
Will add flonase for post nasal drip. She has tried OTC medications with only partial relief in the past.

## 2015-02-10 NOTE — Assessment & Plan Note (Addendum)
Patient has recently started a diet and wishes to jump start with phentermine to encourage her with some weight loss. Will fill for 3 months. No cardiac risk. Reviewed and signed labs done at her work from a couple months ago.

## 2015-02-10 NOTE — Assessment & Plan Note (Addendum)
Now that patient is traveling less for her job she will work on getting her colon cancer screening. She took the prevnar today. Shingles series completed, tetanus up to date. Has had recent mammogram and bone density (will get records). Recent labs done at her work and reviewed. BP okay. She is working to increase her exercise.

## 2015-02-11 ENCOUNTER — Telehealth: Payer: Self-pay

## 2015-02-11 NOTE — Telephone Encounter (Signed)
-----   Message from Olga Millers, MD sent at 02/11/2015 11:37 AM EDT ----- Please call and let her know that the thyroid is doing well.

## 2015-02-11 NOTE — Telephone Encounter (Signed)
Advised pt of dr Jeraldine Loots findings

## 2015-05-27 ENCOUNTER — Other Ambulatory Visit: Payer: Self-pay | Admitting: Internal Medicine

## 2015-05-31 ENCOUNTER — Other Ambulatory Visit: Payer: Self-pay | Admitting: Internal Medicine

## 2015-06-06 ENCOUNTER — Telehealth: Payer: Self-pay

## 2015-06-06 MED ORDER — PHENTERMINE HCL 37.5 MG PO TABS: 37.5000 mg | ORAL_TABLET | Freq: Every day | ORAL | Status: DC

## 2015-06-06 NOTE — Telephone Encounter (Signed)
rx request for phentermine 37.5 mg tab.

## 2015-06-06 NOTE — Telephone Encounter (Signed)
Needs visit   Denied

## 2015-06-09 NOTE — Telephone Encounter (Signed)
Shana is calling patient to schedule an office visit. 

## 2015-06-09 NOTE — Telephone Encounter (Signed)
Will schedule follow up visit for medication review

## 2015-08-03 ENCOUNTER — Other Ambulatory Visit: Payer: Self-pay | Admitting: Internal Medicine

## 2015-08-03 NOTE — Telephone Encounter (Signed)
Will deny, needs visit. I only agreed to fill for 3 months then she needs visit. Has not been back since March.

## 2015-08-03 NOTE — Telephone Encounter (Signed)
Spoke with patient and explained to her that she needs to schedule an office visit.

## 2015-08-09 ENCOUNTER — Encounter: Payer: Self-pay | Admitting: Internal Medicine

## 2015-08-09 ENCOUNTER — Ambulatory Visit (INDEPENDENT_AMBULATORY_CARE_PROVIDER_SITE_OTHER): Payer: Managed Care, Other (non HMO) | Admitting: Internal Medicine

## 2015-08-09 VITALS — BP 122/82 | HR 72 | Temp 98.4°F | Resp 12 | Ht 66.5 in | Wt 173.0 lb

## 2015-08-09 DIAGNOSIS — E663 Overweight: Secondary | ICD-10-CM | POA: Diagnosis not present

## 2015-08-09 MED ORDER — PHENTERMINE HCL 37.5 MG PO TABS: 37.5000 mg | ORAL_TABLET | Freq: Every day | ORAL | Status: DC

## 2015-08-09 NOTE — Progress Notes (Signed)
Pre visit review using our clinic review tool, if applicable. No additional management support is needed unless otherwise documented below in the visit note. 

## 2015-08-09 NOTE — Progress Notes (Signed)
   Subjective:    Patient ID: Heather Rivera, female    DOB: 07/06/1951, 64 y.o.   MRN: [id number]  HPI The patient is a 64 YO female coming in requesting refill of the phentermine she has been taking. She started taking it around April and has done fairly well without symptoms. She has not really increased her exercise (4-5 times a week when home) or changed her diet other than having less appetite. She has been laid off at her job the end of the year and is under a lot of stress trying to find another job.   Review of Systems  Constitutional: Negative.   HENT: Negative.   Eyes: Negative.   Respiratory: Negative.   Cardiovascular: Negative.   Gastrointestinal: Negative.   Musculoskeletal: Negative.   Skin: Negative.   Neurological: Negative.   Psychiatric/Behavioral: Negative.       Objective:   Physical Exam  Constitutional: She is oriented to person, place, and time. She appears well-developed and well-nourished.  Overweight  HENT:  Head: Normocephalic and atraumatic.  Eyes: EOM are normal.  Neck: Normal range of motion.  Cardiovascular: Normal rate and regular rhythm.   Pulmonary/Chest: Effort normal and breath sounds normal. No respiratory distress. She has no wheezes.  Abdominal: Soft. Bowel sounds are normal. She exhibits no distension. There is no tenderness.  Neurological: She is alert and oriented to person, place, and time.  Skin: Skin is warm and dry.   Filed Vitals:   08/09/15 0816  BP: 122/82  Pulse: 72  Temp: 98.4 F (36.9 C)  TempSrc: Oral  Resp: 12  Height: 5' 6.5" (1.689 m)  Weight: 173 lb (78.472 kg)  SpO2: 96%      Assessment & Plan:  Flu shot given at visit.

## 2015-08-09 NOTE — Assessment & Plan Note (Signed)
One month of phentermine given to her at visit and no more. Will remove from her medication list so it is not refilled accidentally. She is exercising well, dealing with some new stressors in her life.

## 2015-08-09 NOTE — Patient Instructions (Signed)
We have refilled the phentermine and this will be the last prescription of it since you have now been on it for about 6 months.   Probiotics are fine to take.   Benadryl gel is one of the better forms for mosquito bites, use a repellant with DEET in it. Be sure to wear protective clothing and if applicable netting around your bed at nighttime.

## 2015-09-14 ENCOUNTER — Encounter: Payer: Self-pay | Admitting: Podiatry

## 2015-09-14 ENCOUNTER — Ambulatory Visit (INDEPENDENT_AMBULATORY_CARE_PROVIDER_SITE_OTHER): Payer: Managed Care, Other (non HMO) | Admitting: Podiatry

## 2015-09-14 DIAGNOSIS — M2040 Other hammer toe(s) (acquired), unspecified foot: Secondary | ICD-10-CM

## 2015-09-14 NOTE — Progress Notes (Signed)
   Subjective:    Patient ID: Heather Rivera, female    DOB: Apr 11, 1951, 64 y.o.   MRN: [id number]  HPIThis patient presents to the office with two complaints on her left foot.  She has painful corn fourth toe left foot due to her hammer toes.  She has developed painful corn.  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  The patient is here with left foot 4th toe pain, possibly a corn on the top of the toe.  Review of Systems  All other systems reviewed and are negative.      Objective:   Physical Exam GENERAL APPEARANCE: Alert, conversant. Appropriately groomed. No acute distress.  VASCULAR: Pedal pulses palpable at  [facility] and PT bilateral.  Capillary refill time is immediate to all digits,  Normal temperature gradient.  Digital hair growth is present bilateral  NEUROLOGIC: sensation is normal to 5.07 monofilament at 5/5 sites bilateral.  Light touch is intact bilateral, Muscle strength normal.  MUSCULOSKELETAL: acceptable muscle strength, tone and stability bilateral.  Intrinsic muscluature intact bilateral.  Rectus appearance of foot and digits noted bilateral. Corn at DIPJ fourth toe left foot.  PLantar fibroma left plantar fascia.  DERMATOLOGIC: skin color, texture, and turgor are within normal limits.  No preulcerative lesions or ulcers  are seen, no interdigital maceration noted.  No open lesions present.  Digital nails are asymptomatic. No drainage noted.         Assessment & Plan:  Hammer toe left foot  Plantar fibroma left foot.  ROV>  Debrided her corn.  Discussed future treatment of plantar fibroma.

## 2015-09-19 ENCOUNTER — Ambulatory Visit (INDEPENDENT_AMBULATORY_CARE_PROVIDER_SITE_OTHER): Payer: Managed Care, Other (non HMO) | Admitting: Internal Medicine

## 2015-09-19 ENCOUNTER — Encounter: Payer: Self-pay | Admitting: Internal Medicine

## 2015-09-19 VITALS — BP 128/80 | HR 68 | Temp 98.2°F | Resp 20 | Ht 66.0 in | Wt 170.0 lb

## 2015-09-19 DIAGNOSIS — R21 Rash and other nonspecific skin eruption: Secondary | ICD-10-CM

## 2015-09-19 MED ORDER — TRIAMCINOLONE ACETONIDE 0.1 % EX CREA: 1.0000 "application " | TOPICAL_CREAM | Freq: Two times a day (BID) | CUTANEOUS | Status: DC

## 2015-09-19 MED ORDER — VALACYCLOVIR HCL 1 G PO TABS: 1000.0000 mg | ORAL_TABLET | Freq: Two times a day (BID) | ORAL | Status: DC

## 2015-09-19 NOTE — Progress Notes (Signed)
Pre visit review using our clinic review tool, if applicable. No additional management support is needed unless otherwise documented below in the visit note. 

## 2015-09-19 NOTE — Patient Instructions (Signed)
We have sent in valtrex which is the anti-viral medicine. Take 1 pill twice a day for 10 days.  We have also sent in a cream called triamcinolone that you can use twice a day for 1 week.

## 2015-09-20 DIAGNOSIS — R21 Rash and other nonspecific skin eruption: Secondary | ICD-10-CM | POA: Insufficient documentation

## 2015-09-20 NOTE — Progress Notes (Signed)
   Subjective:    Patient ID: Heather Rivera, female    DOB: 06-Sep-1951, 64 y.o.   MRN: [id number]  HPI The patient is a 64 YO female coming in for rash behind her ear. It started hurting around the time the rash broke out. Hurting a little and mildly itchy. No new exposure of change of shampoo or hair product. Has had shingles in the past and this feels vaguely familiar. No fevers or chills. Not otherwise sick and no new symptoms. No change in hearing or vision. No numbness on her face.   Review of Systems  Constitutional: Negative for fever, chills, activity change, appetite change, fatigue and unexpected weight change.  HENT: Positive for ear pain. Negative for ear discharge, rhinorrhea, sinus pressure and sore throat.   Eyes: Negative.   Respiratory: Negative.   Cardiovascular: Negative.   Gastrointestinal: Negative.   Skin: Positive for rash.      Objective:   Physical Exam  Constitutional: She appears well-developed and well-nourished.  HENT:  Head: Normocephalic and atraumatic.  Right Ear: External ear normal.  Left Ear: External ear normal.  Nose: Nose normal.  Mouth/Throat: Oropharynx is clear and moist.  Rash behind the right ear, sensitive to the touch, small blistering within the rash  Eyes: Conjunctivae and EOM are normal. Pupils are equal, round, and reactive to light.  Neck: Normal range of motion.  Cardiovascular: Normal rate and regular rhythm.   Pulmonary/Chest: Effort normal and breath sounds normal.  Abdominal: Soft.  Skin: Rash noted.   Filed Vitals:   09/19/15 1407  BP: 128/80  Pulse: 68  Temp: 98.2 F (36.8 C)  TempSrc: Oral  Resp: 20  Height: 5\' 6"  (1.676 m)  Weight: 170 lb (77.111 kg)  SpO2: 96%      Assessment & Plan:

## 2015-09-20 NOTE — Assessment & Plan Note (Signed)
Concern that this represents an outbreak of shingles. Rx for valtrex as well as triamcinolone cream. If no resolution in 1-2 weeks she will call her dermatologist.

## 2016-01-13 ENCOUNTER — Telehealth: Payer: Self-pay

## 2016-01-13 MED ORDER — ZOLPIDEM TARTRATE 5 MG PO TABS: 5.0000 mg | ORAL_TABLET | Freq: Every evening | ORAL | Status: DC | PRN

## 2016-01-13 NOTE — Addendum Note (Signed)
Addended by: Pricilla Holm A on: 01/13/2016 12:58 PM   Modules accepted: Orders

## 2016-01-13 NOTE — Telephone Encounter (Signed)
Printed and signed.  

## 2016-01-13 NOTE — Telephone Encounter (Signed)
Rx faxed back to [facility].../lmb 

## 2016-01-13 NOTE — Telephone Encounter (Signed)
Recd faxed rx refill request for ambien from [facility]----please advise, thanks

## 2016-09-06 ENCOUNTER — Encounter: Payer: Self-pay | Admitting: Geriatric Medicine

## 2016-11-27 ENCOUNTER — Other Ambulatory Visit (INDEPENDENT_AMBULATORY_CARE_PROVIDER_SITE_OTHER): Payer: Medicare Other

## 2016-11-27 ENCOUNTER — Encounter: Payer: Self-pay | Admitting: Internal Medicine

## 2016-11-27 ENCOUNTER — Ambulatory Visit (INDEPENDENT_AMBULATORY_CARE_PROVIDER_SITE_OTHER): Payer: Medicare Other | Admitting: Internal Medicine

## 2016-11-27 VITALS — BP 120/70 | HR 67 | Temp 97.5°F | Resp 14 | Ht 66.0 in | Wt 182.0 lb

## 2016-11-27 DIAGNOSIS — Z0000 Encounter for general adult medical examination without abnormal findings: Secondary | ICD-10-CM

## 2016-11-27 DIAGNOSIS — J3089 Other allergic rhinitis: Secondary | ICD-10-CM

## 2016-11-27 LAB — COMPREHENSIVE METABOLIC PANEL
ALBUMIN: 4.1 g/dL (ref 3.5–5.2)
ALK PHOS: 39 U/L (ref 39–117)
ALT: 25 U/L (ref 0–35)
AST: 18 U/L (ref 0–37)
BILIRUBIN TOTAL: 0.7 mg/dL (ref 0.2–1.2)
BUN: 19 mg/dL (ref 6–23)
CO2: 29 mEq/L (ref 19–32)
Calcium: 9.6 mg/dL (ref 8.4–10.5)
Chloride: 101 mEq/L (ref 96–112)
Creatinine, Ser: 0.81 mg/dL (ref 0.40–1.20)
GFR: 74.82 mL/min (ref >60.00)
GLUCOSE: 117 mg/dL — AB (ref 70–99)
POTASSIUM: 4.1 meq/L (ref 3.5–5.1)
Sodium: 136 mEq/L (ref 135–145)
TOTAL PROTEIN: 6.8 g/dL (ref 6.0–8.3)

## 2016-11-27 LAB — CBC
HEMATOCRIT: 40.1 % (ref 36.0–46.0)
Hemoglobin: 13.7 g/dL (ref 12.0–15.0)
MCHC: 34.3 g/dL (ref 30.0–36.0)
MCV: 86.9 fl (ref 78.0–100.0)
Platelets: 345 10*3/uL (ref 150.0–400.0)
RBC: 4.61 Mil/uL (ref 3.87–5.11)
RDW: 14.4 % (ref 11.5–15.5)
WBC: 6.8 10*3/uL (ref 4.0–10.5)

## 2016-11-27 LAB — HEMOGLOBIN A1C: HEMOGLOBIN A1C: 5.8 % (ref 4.6–6.5)

## 2016-11-27 LAB — LIPID PANEL
CHOLESTEROL: 295 mg/dL — AB (ref 0–200)
HDL: 122.2 mg/dL (ref >39.00)
LDL Cholesterol: 163 mg/dL — ABNORMAL HIGH (ref 0–99)
NONHDL: 172.41
Total CHOL/HDL Ratio: 2
Triglycerides: 49 mg/dL (ref 0.0–149.0)
VLDL: 9.8 mg/dL (ref 0.0–40.0)

## 2016-11-27 MED ORDER — PHENTERMINE HCL 37.5 MG PO TABS: 37.5000 mg | ORAL_TABLET | Freq: Every day | ORAL | 2 refills | Status: DC

## 2016-11-27 NOTE — Assessment & Plan Note (Signed)
Using flonase with good success most of the time.

## 2016-11-27 NOTE — Progress Notes (Signed)
Pre visit review using our clinic review tool, if applicable. No additional management support is needed unless otherwise documented below in the visit note. 

## 2016-11-27 NOTE — Progress Notes (Signed)
   Subjective:    Patient ID: Heather Rivera, female    DOB: 1951-10-27, 66 y.o.   MRN: [id number]  HPI Here for welcome to medicare wellness, no new complaints. Please see A/P for status and treatment of chronic medical problems. Lost her job about 1 year ago and has been struggling to find work since that time.   Diet: average, more snacking Physical activity: active Depression/mood screen: negative Hearing: intact to whispered voice Visual acuity: grossly normal, performs annual eye exam  ADLs: capable Fall risk: none Home safety: good Cognitive evaluation: intact to orientation, naming, recall and repetition EOL planning: adv directives discussed, in place  I have personally reviewed and have noted 1. The patient's medical and social history - reviewed today no changes 2. Their use of alcohol, tobacco or illicit drugs 3. Their current medications and supplements 4. The patient's functional ability including ADL's, fall risks, home safety risks and hearing or visual impairment. 5. Diet and physical activities 6. Evidence for depression or mood disorders 7. Care team reviewed and updated (available in snapshot)  Review of Systems  Constitutional: Negative.   HENT: Negative.   Eyes: Negative.   Respiratory: Negative for cough, chest tightness and shortness of breath.   Cardiovascular: Negative for chest pain, palpitations and leg swelling.  Gastrointestinal: Negative for abdominal distention, abdominal pain, constipation, diarrhea, nausea and vomiting.  Musculoskeletal: Negative.   Skin: Negative.   Neurological: Negative.   Psychiatric/Behavioral: Positive for dysphoric mood. Negative for agitation, behavioral problems, decreased concentration, self-injury and suicidal ideas. The patient is not nervous/anxious.       Objective:   Physical Exam  Constitutional: She is oriented to person, place, and time. She appears well-developed and well-nourished.  HENT:  Head:  Normocephalic and atraumatic.  Eyes: EOM are normal.  Neck: Normal range of motion.  Cardiovascular: Normal rate and regular rhythm.   Pulmonary/Chest: Effort normal and breath sounds normal. No respiratory distress. She has no wheezes. She has no rales.  Abdominal: Soft. Bowel sounds are normal. She exhibits no distension. There is no tenderness. There is no rebound.  Musculoskeletal: She exhibits no edema.  Neurological: She is alert and oriented to person, place, and time. Coordination normal.  Skin: Skin is warm and dry.  Psychiatric: She has a normal mood and affect.   Vitals:   11/27/16 0902  BP: 120/70  Pulse: 67  Resp: 14  Temp: 97.5 F (36.4 C)  TempSrc: Oral  SpO2: 99%  Weight: 182 lb (82.6 kg)  Height: 5\' 6"  (1.676 m)      Assessment & Plan:

## 2016-11-27 NOTE — Assessment & Plan Note (Signed)
Checking labs, EKG up to date. Declines colonoscopy or mammogram or DEXA. She is up to date on pneumonia series, flu shot, tetanus. Counseled on sun safety and mole surveillance. Counseled on dangers of distracted driving. Given 10 year screening recommendations.

## 2016-11-27 NOTE — Patient Instructions (Signed)
We have given you the phentermine and will check the labs today.   Health Maintenance, Female Introduction Adopting a healthy lifestyle and getting preventive care can go a long way to promote health and wellness. Talk with your health care provider about what schedule of regular examinations is right for you. This is a good chance for you to check in with your provider about disease prevention and staying healthy. In between checkups, there are plenty of things you can do on your own. Experts have done a lot of research about which lifestyle changes and preventive measures are most likely to keep you healthy. Ask your health care provider for more information. Weight and diet Eat a healthy diet  Be sure to include plenty of vegetables, fruits, low-fat dairy products, and lean protein.  Do not eat a lot of foods high in solid fats, added sugars, or salt.  Get regular exercise. This is one of the most important things you can do for your health.  Most adults should exercise for at least 150 minutes each week. The exercise should increase your heart rate and make you sweat (moderate-intensity exercise).  Most adults should also do strengthening exercises at least twice a week. This is in addition to the moderate-intensity exercise. Maintain a healthy weight  Body mass index (BMI) is a measurement that can be used to identify possible weight problems. It estimates body fat based on height and weight. Your health care provider can help determine your BMI and help you achieve or maintain a healthy weight.  For females 64 years of age and older:  A BMI below 18.5 is considered underweight.  A BMI of 18.5 to 24.9 is normal.  A BMI of 25 to 29.9 is considered overweight.  A BMI of 30 and above is considered obese. Watch levels of cholesterol and blood lipids  You should start having your blood tested for lipids and cholesterol at 68 years of age, then have this test every 5 years.  You may  need to have your cholesterol levels checked more often if:  Your lipid or cholesterol levels are high.  You are older than 68 years of age.  You are at high risk for heart disease. Cancer screening Lung Cancer  Lung cancer screening is recommended for adults 56-15 years old who are at high risk for lung cancer because of a history of smoking.  A yearly low-dose CT scan of the lungs is recommended for people who:  Currently smoke.  Have quit within the past 15 years.  Have at least a 30-pack-year history of smoking. A pack year is smoking an average of one pack of cigarettes a day for 1 year.  Yearly screening should continue until it has been 15 years since you quit.  Yearly screening should stop if you develop a health problem that would prevent you from having lung cancer treatment. Breast Cancer  Practice breast self-awareness. This means understanding how your breasts normally appear and feel.  It also means doing regular breast self-exams. Let your health care provider know about any changes, no matter how small.  If you are in your 20s or 30s, you should have a clinical breast exam (CBE) by a health care provider every 1-3 years as part of a regular health exam.  If you are 49 or older, have a CBE every year. Also consider having a breast X-ray (mammogram) every year.  If you have a family history of breast cancer, talk to your health care  provider about genetic screening.  If you are at high risk for breast cancer, talk to your health care provider about having an MRI and a mammogram every year.  Breast cancer gene (BRCA) assessment is recommended for women who have family members with BRCA-related cancers. BRCA-related cancers include:  Breast.  Ovarian.  Tubal.  Peritoneal cancers.  Results of the assessment will determine the need for genetic counseling and BRCA1 and BRCA2 testing. Cervical Cancer  Your health care provider may recommend that you be  screened regularly for cancer of the pelvic organs (ovaries, uterus, and vagina). This screening involves a pelvic examination, including checking for microscopic changes to the surface of your cervix (Pap test). You may be encouraged to have this screening done every 3 years, beginning at age 37.  For women ages 68-65, health care providers may recommend pelvic exams and Pap testing every 3 years, or they may recommend the Pap and pelvic exam, combined with testing for human papilloma virus (HPV), every 5 years. Some types of HPV increase your risk of cervical cancer. Testing for HPV may also be done on women of any age with unclear Pap test results.  Other health care providers may not recommend any screening for nonpregnant women who are considered low risk for pelvic cancer and who do not have symptoms. Ask your health care provider if a screening pelvic exam is right for you.  If you have had past treatment for cervical cancer or a condition that could lead to cancer, you need Pap tests and screening for cancer for at least 20 years after your treatment. If Pap tests have been discontinued, your risk factors (such as having a new sexual partner) need to be reassessed to determine if screening should resume. Some women have medical problems that increase the chance of getting cervical cancer. In these cases, your health care provider may recommend more frequent screening and Pap tests. Colorectal Cancer  This type of cancer can be detected and often prevented.  Routine colorectal cancer screening usually begins at 68 years of age and continues through 68 years of age.  Your health care provider may recommend screening at an earlier age if you have risk factors for colon cancer.  Your health care provider may also recommend using home test kits to check for hidden blood in the stool.  A small camera at the end of a tube can be used to examine your colon directly (sigmoidoscopy or colonoscopy).  This is done to check for the earliest forms of colorectal cancer.  Routine screening usually begins at age 32.  Direct examination of the colon should be repeated every 5-10 years through 68 years of age. However, you may need to be screened more often if early forms of precancerous polyps or small growths are found. Skin Cancer  Check your skin from head to toe regularly.  Tell your health care provider about any new moles or changes in moles, especially if there is a change in a mole's shape or color.  Also tell your health care provider if you have a mole that is larger than the size of a pencil eraser.  Always use sunscreen. Apply sunscreen liberally and repeatedly throughout the day.  Protect yourself by wearing long sleeves, pants, a wide-brimmed hat, and sunglasses whenever you are outside. Heart disease, diabetes, and high blood pressure  High blood pressure causes heart disease and increases the risk of stroke. High blood pressure is more likely to develop in:  People who  have blood pressure in the high end of the normal range (130-139/85-89 mm Hg).  People who are overweight or obese.  People who are African American.  If you are 70-58 years of age, have your blood pressure checked every 3-5 years. If you are 37 years of age or older, have your blood pressure checked every year. You should have your blood pressure measured twice-once when you are at a hospital or clinic, and once when you are not at a hospital or clinic. Record the average of the two measurements. To check your blood pressure when you are not at a hospital or clinic, you can use:  An automated blood pressure machine at a pharmacy.  A home blood pressure monitor.  If you are between 22 years and 62 years old, ask your health care provider if you should take aspirin to prevent strokes.  Have regular diabetes screenings. This involves taking a blood sample to check your fasting blood sugar level.  If you  are at a normal weight and have a low risk for diabetes, have this test once every three years after 68 years of age.  If you are overweight and have a high risk for diabetes, consider being tested at a younger age or more often. Preventing infection Hepatitis B  If you have a higher risk for hepatitis B, you should be screened for this virus. You are considered at high risk for hepatitis B if:  You were born in a country where hepatitis B is common. Ask your health care provider which countries are considered high risk.  Your parents were born in a high-risk country, and you have not been immunized against hepatitis B (hepatitis B vaccine).  You have HIV or AIDS.  You use needles to inject street drugs.  You live with someone who has hepatitis B.  You have had sex with someone who has hepatitis B.  You get hemodialysis treatment.  You take certain medicines for conditions, including cancer, organ transplantation, and autoimmune conditions. Hepatitis C  Blood testing is recommended for:  Everyone born from [birth year] through 1967.  Anyone with known risk factors for hepatitis C. Sexually transmitted infections (STIs)  You should be screened for sexually transmitted infections (STIs) including gonorrhea and chlamydia if:  You are sexually active and are younger than 68 years of age.  You are older than 68 years of age and your health care provider tells you that you are at risk for this type of infection.  Your sexual activity has changed since you were last screened and you are at an increased risk for chlamydia or gonorrhea. Ask your health care provider if you are at risk.  If you do not have HIV, but are at risk, it may be recommended that you take a prescription medicine daily to prevent HIV infection. This is called pre-exposure prophylaxis (PrEP). You are considered at risk if:  You are sexually active and do not regularly use condoms or know the HIV status of your  partner(s).  You take drugs by injection.  You are sexually active with a partner who has HIV. Talk with your health care provider about whether you are at high risk of being infected with HIV. If you choose to begin PrEP, you should first be tested for HIV. You should then be tested every 3 months for as long as you are taking PrEP. Pregnancy  If you are premenopausal and you may become pregnant, ask your health care provider about preconception counseling.  If you may become pregnant, take 400 to 800 micrograms (mcg) of folic acid every day.  If you want to prevent pregnancy, talk to your health care provider about birth control (contraception). Osteoporosis and menopause  Osteoporosis is a disease in which the bones lose minerals and strength with aging. This can result in serious bone fractures. Your risk for osteoporosis can be identified using a bone density scan.  If you are 8 years of age or older, or if you are at risk for osteoporosis and fractures, ask your health care provider if you should be screened.  Ask your health care provider whether you should take a calcium or vitamin D supplement to lower your risk for osteoporosis.  Menopause may have certain physical symptoms and risks.  Hormone replacement therapy may reduce some of these symptoms and risks. Talk to your health care provider about whether hormone replacement therapy is right for you. Follow these instructions at home:  Schedule regular health, dental, and eye exams.  Stay current with your immunizations.  Do not use any tobacco products including cigarettes, chewing tobacco, or electronic cigarettes.  If you are pregnant, do not drink alcohol.  If you are breastfeeding, limit how much and how often you drink alcohol.  Limit alcohol intake to no more than 1 drink per day for nonpregnant women. One drink equals 12 ounces of beer, 5 ounces of wine, or 1 ounces of hard liquor.  Do not use street  drugs.  Do not share needles.  Ask your health care provider for help if you need support or information about quitting drugs.  Tell your health care provider if you often feel depressed.  Tell your health care provider if you have ever been abused or do not feel safe at home. This information is not intended to replace advice given to you by your health care provider. Make sure you discuss any questions you have with your health care provider. Document Released: 05/14/2011 Document Revised: 04/05/2016 Document Reviewed: 08/02/2015  2017 [employer]

## 2016-12-24 ENCOUNTER — Other Ambulatory Visit: Payer: Self-pay | Admitting: Internal Medicine

## 2017-02-13 ENCOUNTER — Ambulatory Visit (INDEPENDENT_AMBULATORY_CARE_PROVIDER_SITE_OTHER): Payer: Medicare Other | Admitting: Internal Medicine

## 2017-02-13 ENCOUNTER — Encounter: Payer: Self-pay | Admitting: Internal Medicine

## 2017-02-13 VITALS — BP 130/90 | HR 83 | Temp 99.0°F | Wt 176.0 lb

## 2017-02-13 DIAGNOSIS — J069 Acute upper respiratory infection, unspecified: Secondary | ICD-10-CM | POA: Diagnosis not present

## 2017-02-13 MED ORDER — AZITHROMYCIN 250 MG PO TABS: ORAL_TABLET | ORAL | 1 refills | Status: DC

## 2017-02-13 MED ORDER — HYDROCODONE-HOMATROPINE 5-1.5 MG/5ML PO SYRP: 5.0000 mL | ORAL_SOLUTION | Freq: Four times a day (QID) | ORAL | 0 refills | Status: AC | PRN

## 2017-02-13 NOTE — Progress Notes (Signed)
Pre visit review using our clinic review tool, if applicable. No additional management support is needed unless otherwise documented below in the visit note. 

## 2017-02-13 NOTE — Progress Notes (Signed)
   Subjective:    Patient ID: Heather Rivera, female    DOB: 10/31/1951, 66 y.o.   MRN: [id number]  HPI   Here with 2-3 days acute onset fever, facial pain, pressure, headache, general weakness and malaise, and greenish d/c, with mild ST and cough, but pt denies chest pain, wheezing, increased sob or doe, orthopnea, PND, increased LE swelling, palpitations, dizziness or syncope. Past Medical History:  Diagnosis Date  . ALLERGIC RHINITIS   . Female pattern baldness   . OBESITY    Past Surgical History:  Procedure Laterality Date  . [place]  . glass removed from finger  2010    reports that she quit smoking about 38 years ago. She has never used smokeless tobacco. She reports that she does not drink alcohol or use drugs. family history includes Alcohol abuse in her mother; Arthritis in her mother. No Known Allergies Current Outpatient Prescriptions on File Prior to Visit  Medication Sig Dispense Refill  . b complex vitamins capsule Take 1 capsule by mouth daily.      . fluticasone (FLONASE) 50 MCG/ACT nasal spray Place 2 sprays into both nostrils daily. 16 g 6  . phentermine (ADIPEX-P) 37.5 MG tablet Take 1 tablet (37.5 mg total) by mouth daily before breakfast. 30 tablet 2  . valACYclovir (VALTREX) 1000 MG tablet Take 1 tablet (1,000 mg total) by mouth 2 (two) times daily. 20 tablet 0  . zolpidem (AMBIEN) 5 MG tablet TAKE 1 TABLET BY MOUTH AT BEDTIME AS NEEDED 30 tablet 0   No current facility-administered medications on file prior to visit.    Review of Systems All otherwise neg per pt     Objective:   Physical Exam BP 130/90   Pulse 83   Temp 99 F (37.2 C) (Oral)   Wt 176 lb (79.8 kg)   SpO2 97%   BMI 28.41 kg/m  VS noted,  Constitutional: Pt appears in NAD HENT: Head: NCAT.  Right Ear: External ear normal.  Left Ear: External ear normal.  Eyes: . Pupils are equal, round, and reactive to light. Conjunctivae and EOM are normal Nose: without d/c or  deformity Bilat tm's with mild erythema.  Max sinus areas mild tender.  Pharynx with mild erythema, no exudate Neck: Neck supple. Gross normal ROM Cardiovascular: Normal rate and regular rhythm.   Pulmonary/Chest: Effort normal and breath sounds without rales or wheezing.  Neurological: Pt is alert. At baseline orientation, motor grossly intact Skin: Skin is warm. No rashes, other new lesions, no LE edema Psychiatric: Pt behavior is normal without agitation  No other exam findings    Assessment & Plan:

## 2017-02-13 NOTE — Patient Instructions (Signed)
Please take all new medication as prescribed - the antibiotic, and cough medicine if needed  Please continue all other medications as before, and refills have been done if requested.  Please have the pharmacy call with any other refills you may need.  Please keep your appointments with your specialists as you may have planned     

## 2017-02-14 ENCOUNTER — Ambulatory Visit: Payer: Medicare Other | Admitting: Internal Medicine

## 2017-02-17 DIAGNOSIS — J069 Acute upper respiratory infection, unspecified: Secondary | ICD-10-CM | POA: Insufficient documentation

## 2017-02-17 NOTE — Assessment & Plan Note (Signed)
Mild to mod, for antibx course,  to f/u any worsening symptoms or concerns 

## 2017-03-29 DIAGNOSIS — M79671 Pain in right foot: Secondary | ICD-10-CM | POA: Diagnosis not present

## 2017-03-29 DIAGNOSIS — B351 Tinea unguium: Secondary | ICD-10-CM | POA: Diagnosis not present

## 2017-03-29 DIAGNOSIS — G5761 Lesion of plantar nerve, right lower limb: Secondary | ICD-10-CM | POA: Diagnosis not present

## 2017-03-29 DIAGNOSIS — G5762 Lesion of plantar nerve, left lower limb: Secondary | ICD-10-CM | POA: Diagnosis not present

## 2017-03-29 DIAGNOSIS — M79672 Pain in left foot: Secondary | ICD-10-CM | POA: Diagnosis not present

## 2017-04-09 DIAGNOSIS — B351 Tinea unguium: Secondary | ICD-10-CM | POA: Diagnosis not present

## 2017-04-24 DIAGNOSIS — M722 Plantar fascial fibromatosis: Secondary | ICD-10-CM | POA: Diagnosis not present

## 2017-04-24 DIAGNOSIS — M79672 Pain in left foot: Secondary | ICD-10-CM | POA: Diagnosis not present

## 2017-04-24 DIAGNOSIS — M2041 Other hammer toe(s) (acquired), right foot: Secondary | ICD-10-CM | POA: Diagnosis not present

## 2017-04-24 DIAGNOSIS — M2042 Other hammer toe(s) (acquired), left foot: Secondary | ICD-10-CM | POA: Diagnosis not present

## 2017-04-24 DIAGNOSIS — G5761 Lesion of plantar nerve, right lower limb: Secondary | ICD-10-CM | POA: Diagnosis not present

## 2017-05-21 ENCOUNTER — Ambulatory Visit (INDEPENDENT_AMBULATORY_CARE_PROVIDER_SITE_OTHER): Payer: Medicare Other | Admitting: Nurse Practitioner

## 2017-05-21 ENCOUNTER — Ambulatory Visit (INDEPENDENT_AMBULATORY_CARE_PROVIDER_SITE_OTHER)
Admission: RE | Admit: 2017-05-21 | Discharge: 2017-05-21 | Disposition: A | Discharge status: Home / Self Care | Payer: Medicare Other | Source: Ambulatory Visit | Attending: Nurse Practitioner | Admitting: Nurse Practitioner

## 2017-05-21 ENCOUNTER — Encounter: Payer: Self-pay | Admitting: Nurse Practitioner

## 2017-05-21 VITALS — BP 120/80 | HR 63 | Temp 97.6°F | Ht 66.0 in | Wt 181.0 lb

## 2017-05-21 DIAGNOSIS — M25511 Pain in right shoulder: Secondary | ICD-10-CM | POA: Diagnosis not present

## 2017-05-21 DIAGNOSIS — M7541 Impingement syndrome of right shoulder: Secondary | ICD-10-CM

## 2017-05-21 MED ORDER — IBUPROFEN-FAMOTIDINE 800-26.6 MG PO TABS: 1.0000 | ORAL_TABLET | Freq: Three times a day (TID) | ORAL | 0 refills | Status: DC | PRN

## 2017-05-21 MED ORDER — DICLOFENAC SODIUM 2 % TD SOLN: 1.0000 "application " | Freq: Two times a day (BID) | TRANSDERMAL | 0 refills | Status: DC | PRN

## 2017-05-21 NOTE — Patient Instructions (Signed)
Follow up with sports medicine as scheduled  Go to basement for shoulder x-ray.

## 2017-05-21 NOTE — Progress Notes (Signed)
Subjective:  Patient ID: Heather Rivera, female    DOB: March 08, 1951  Age: 66 y.o. MRN: [id number]  CC: Shoulder Pain (right shoulder pain down toward elbow. going for a while)   HPI  Right shoulder pain x 61months: No improvement with stretches and massage. No medication used. Right hand dominant. Daily exercise (yoga) She has limited exercise regimen due to shoulder pain. No paresthesia, no weakness. No swelling Pain with lying on right shoulder Denies any injury. She is not interested in any invasion procedure at this time/  Outpatient Medications Prior to Visit  Medication Sig Dispense Refill  . b complex vitamins capsule Take 1 capsule by mouth daily.      . fluticasone (FLONASE) 50 MCG/ACT nasal spray Place 2 sprays into both nostrils daily. 16 g 6  . valACYclovir (VALTREX) 1000 MG tablet Take 1 tablet (1,000 mg total) by mouth 2 (two) times daily. 20 tablet 0  . zolpidem (AMBIEN) 5 MG tablet TAKE 1 TABLET BY MOUTH AT BEDTIME AS NEEDED (Patient not taking: Reported on 05/21/2017) 30 tablet 0  . azithromycin (ZITHROMAX Z-PAK) 250 MG tablet 2 tab by mouth day 1, then 1 per day (Patient not taking: Reported on 05/21/2017) 6 tablet 1  . phentermine (ADIPEX-P) 37.5 MG tablet Take 1 tablet (37.5 mg total) by mouth daily before breakfast. (Patient not taking: Reported on 05/21/2017) 30 tablet 2   No facility-administered medications prior to visit.     ROS See HPI  Objective:  BP 120/80   Pulse 63   Temp 97.6 F (36.4 C)   Ht 5\' 6"  (1.676 m)   Wt 181 lb (82.1 kg)   SpO2 97%   BMI 29.21 kg/m   BP Readings from Last 3 Encounters:  05/21/17 120/80  02/13/17 130/90  11/27/16 120/70    Wt Readings from Last 3 Encounters:  05/21/17 181 lb (82.1 kg)  02/13/17 176 lb (79.8 kg)  11/27/16 182 lb (82.6 kg)    Physical Exam  Constitutional: No distress.  Neck: Normal range of motion. Neck supple. No thyromegaly present.  Cardiovascular: Normal rate, regular rhythm and normal  heart sounds.   Pulmonary/Chest: Effort normal and breath sounds normal.  Musculoskeletal: She exhibits tenderness. She exhibits no edema.       Right shoulder: She exhibits decreased range of motion, tenderness, deformity, pain and spasm. She exhibits no bony tenderness, no swelling, no effusion, no crepitus, no laceration, normal pulse and normal strength.       Right elbow: Normal.      Right wrist: Normal.       Cervical back: Normal.       Right upper arm: Normal.       Right forearm: Normal.       Right hand: Normal.  Positive right neer impingement test. No muscle atrophy noted  Lymphadenopathy:    She has no cervical adenopathy.  Skin: Skin is warm and dry. No rash noted. No erythema.  Vitals reviewed.   Lab Results  Component Value Date   WBC 6.8 11/27/2016   HGB 13.7 11/27/2016   HCT 40.1 11/27/2016   PLT 345.0 11/27/2016   GLUCOSE 117 (H) 11/27/2016   CHOL 295 (H) 11/27/2016   TRIG 49.0 11/27/2016   HDL 122.20 11/27/2016   LDLCALC 163 (H) 11/27/2016   ALT 25 11/27/2016   AST 18 11/27/2016   NA 136 11/27/2016   K 4.1 11/27/2016   CL 101 11/27/2016   CREATININE 0.81 11/27/2016   BUN  19 11/27/2016   CO2 29 11/27/2016   TSH 2.12 02/09/2015   HGBA1C 5.8 11/27/2016    No results found.  Assessment & Plan:   Jalesia was seen today for shoulder pain.  Diagnoses and all orders for this visit:  Right anterior shoulder pain -     Ambulatory referral to Sports Medicine -     DG Shoulder Right; Future -     Diclofenac Sodium (PENNSAID) 2 % SOLN; Place 1 application onto the skin 2 (two) times daily as needed. -     Ibuprofen-Famotidine (DUEXIS) 800-26.6 MG TABS; Take 1 tablet by mouth 3 (three) times daily between meals as needed.  Rotator cuff impingement syndrome of right shoulder -     Ambulatory referral to Sports Medicine -     DG Shoulder Right; Future -     Diclofenac Sodium (PENNSAID) 2 % SOLN; Place 1 application onto the skin 2 (two) times daily as  needed. -     Ibuprofen-Famotidine (DUEXIS) 800-26.6 MG TABS; Take 1 tablet by mouth 3 (three) times daily between meals as needed.   I have discontinued Ms. Cahalan's phentermine and azithromycin. I am also having her start on Diclofenac Sodium and Ibuprofen-Famotidine. Additionally, I am having her maintain her b complex vitamins, fluticasone, valACYclovir, and zolpidem.  Meds ordered this encounter  Medications  . Diclofenac Sodium (PENNSAID) 2 % SOLN    Sig: Place 1 application onto the skin 2 (two) times daily as needed.    Dispense:  2 g    Refill:  0    Order Specific Question:   Supervising Provider    Answer:   Cassandria Anger [[id number]]  . Ibuprofen-Famotidine (DUEXIS) 800-26.6 MG TABS    Sig: Take 1 tablet by mouth 3 (three) times daily between meals as needed.    Dispense:  9 tablet    Refill:  0    Order Specific Question:   Supervising Provider    Answer:   Cassandria Anger [[id number]]    Follow-up: Return if symptoms worsen or fail to improve.  Wilfred Lacy, NP

## 2017-05-24 ENCOUNTER — Encounter: Payer: Self-pay | Admitting: Sports Medicine

## 2017-05-24 ENCOUNTER — Ambulatory Visit: Payer: Self-pay

## 2017-05-24 ENCOUNTER — Ambulatory Visit (INDEPENDENT_AMBULATORY_CARE_PROVIDER_SITE_OTHER): Payer: Medicare Other | Admitting: Sports Medicine

## 2017-05-24 VITALS — BP 120/82 | HR 72 | Ht 66.0 in | Wt 180.0 lb

## 2017-05-24 DIAGNOSIS — G8929 Other chronic pain: Secondary | ICD-10-CM | POA: Diagnosis not present

## 2017-05-24 DIAGNOSIS — M25511 Pain in right shoulder: Secondary | ICD-10-CM | POA: Diagnosis not present

## 2017-05-24 DIAGNOSIS — M75101 Unspecified rotator cuff tear or rupture of right shoulder, not specified as traumatic: Secondary | ICD-10-CM | POA: Diagnosis not present

## 2017-05-24 MED ORDER — NITROGLYCERIN 0.2 MG/HR TD PT24: MEDICATED_PATCH | TRANSDERMAL | 1 refills | Status: DC

## 2017-05-24 NOTE — Progress Notes (Signed)
OFFICE VISIT NOTE Heather Rivera. Heli Dino, [facility]  Heather Rivera - 66 y.o. female MRN [id number]  Date of birth: 1951-10-06  Visit Date: 05/24/2017  PCP: Hoyt Koch, MD   Referred by: Gayla Doss, CMA acting as scribe for Dr. Paulla Fore.  SUBJECTIVE:   Chief Complaint  Patient presents with  . New Patient (Initial Visit)    right shoulder pain   HPI: As below and per problem based documentation when appropriate.  Pt presents today with complaint of right shoulder pain x 8 months. She had Xray done 05/21/17 which showed the following:  EXAM: RIGHT SHOULDER - 2+ VIEW COMPARISON:  None. FINDINGS: There is no evidence of fracture or dislocation. There is no evidence of arthropathy or other focal bone abnormality. Soft tissues are unremarkable. IMPRESSION: Negative.  No known injury or trauma to the shoulder. She does Pilates and wonders if pain could be related to that. She doesn't recall pulling anything during her exercise.   The pain is described as tightness and throbbing and is rated as 5/10 at its worst but 0/10 at rest. .  Worsened with laying on the right shoulder. She also has more pain when reaching backwards.  Improves when at rest.  Therapies tried include: She was given Duexis but did not try taking it. She was also given Pennsaid and found some relief with this. She has tried Production assistant, radio therapy with little relief. She has tried applying heat to the shoulder and got little relief. Pt reports that pain is the best that it has been in over a month.   Other associated symptoms include: Pain radiates into the right side of the neck and down into the right arm.   Pt denies numbness or tingling, fever, chills, night sweats.     Review of Systems  Constitutional: Negative for chills and fever.  Respiratory: Negative for cough and hemoptysis.   Cardiovascular: Negative  for chest pain and palpitations.  Musculoskeletal: Positive for joint pain and myalgias. Negative for falls.  Neurological: Negative for dizziness, tingling and headaches.  Endo/Heme/Allergies: Does not bruise/bleed easily.    Otherwise per HPI.  HISTORY & PERTINENT PRIOR DATA:  No specialty comments available. She reports that she quit smoking about 38 years ago. She has never used smokeless tobacco.   Recent Labs  11/27/16 0939  HGBA1C 5.8   Medications & Allergies reviewed per EMR Patient Active Problem List   Diagnosis Date Noted  . Rotator cuff syndrome of right shoulder 05/24/2017  . Acute upper respiratory infection 02/17/2017  . Routine general medical examination at a health care facility 02/10/2015  . Overweight (BMI 25.0-29.9) 01/05/2011  . Allergic rhinitis 10/23/2010   Past Medical History:  Diagnosis Date  . ALLERGIC RHINITIS   . Female pattern baldness   . OBESITY    Family History  Problem Relation Age of Onset  . Alcohol abuse Mother   . Arthritis Mother        grandparent   Past Surgical History:  Procedure Laterality Date  . [place]  . glass removed from finger  2010   Social History   Occupational History  . Not on file.   Social History Main Topics  . Smoking status: Former Smoker    Quit date: 11/12/1978  . Smokeless tobacco: Never Used     Comment: Divorced, single and lives alone. 2 grown kids. employed with hotel  sales, [employer] and speaker presentation  . Alcohol use No  . Drug use: No  . Sexual activity: Not on file    OBJECTIVE:  VS:  HT:5\' 6"  (167.6 cm)   WT:180 lb (81.6 kg) (pt reported, refused to weigh today)  BMI:29.1    BP:120/82  HR:72bpm  TEMP: ( )  RESP:97 % EXAM: Findings:  WDWN, NAD, Non-toxic appearing Alert & appropriately interactive Not depressed or anxious appearing No increased work of breathing. Pupils are equal. EOM intact without nystagmus No clubbing or cyanosis of the extremities  appreciated No significant rashes/lesions/ulcerations overlying the examined area. Radial pulses 2+/4. No significant generalized UE edema. Sensation intact to light touch in upper extremities.   Right Shoulder Exam: Normal alignment, Normal Contours No overlying erythema/ecchymosis. No pain or crepitation with axial loading and circumduction TTP mild over anterior shoulder No TTP over: Distal clavicle, proximal humerus, scapula Internal Rotation: Normal External Rotation: Normal Empty can: Normal Hawkins: Only minimal pain, normal range of motion Neers: Minimal pain, 10-15 of limitation with overhead motion Speeds:Normal O'Brien's: Normal  ++++++++++++++++++++++++++++++++++++++++++++++++++++++++++++++++++ LIMITED MSK ULTRASOUND OF Right Shoulder Images were obtained and interpreted by myself, Teresa Coombs, DO  Images have been saved and stored to PACS system. Images obtained on: GE S7 Ultrasound machine  FINDINGS:  Biceps Tendon: Small amount of inferior fluid within the biceps tendon but otherwise intact. Pec Major Insertion: Normal Subscapularis Tendon: Normal Supraspinatus Tendon: Small articular sided interstitial tear without significant retraction.  Only mild hypoechoic change and no significant neovascularity. Infraspinatus/Teres Minor Tendon: Normal AC Joint: N/a JOINT: No significant GH spurring appreciated LABRUM: Not evaluated  IMPRESSION:  Small articular sided tear of the supraspinatus, isolated      Dg Shoulder Right  Result Date: 05/21/2017 CLINICAL DATA:  Right shoulder pain for 8 months. EXAM: RIGHT SHOULDER - 2+ VIEW COMPARISON:  None. FINDINGS: There is no evidence of fracture or dislocation. There is no evidence of arthropathy or other focal bone abnormality. Soft tissues are unremarkable. IMPRESSION: Negative. Electronically Signed   By: Lorriane Shire M.D.   On: 05/21/2017 10:06   ASSESSMENT & PLAN:   Problem List Items Addressed This Visit     Rotator cuff syndrome of right shoulder    Small interstitial articular sided tear of the supraspinatus. Nitroglycerin protocol Rotator cuff strengthening exercises focusing on eccentric phase of supraspinatus strengthening with scapular stabilization. Avoidance of exacerbating activities discussed Follow-up in 6 weeks for repeat ultrasound       Other Visit Diagnoses    Chronic right shoulder pain    -  Primary   Relevant Orders   [place] LIMITED JOINT SPACE STRUCTURES UP RIGHT(NO LINKED CHARGES)      Follow-up: Return in about 6 weeks (around 07/05/2017).   CMA/ATC served as Education administrator during this visit. History, Physical, and Plan performed by medical provider. Documentation and orders reviewed and attested to.      Teresa Coombs, [facility] Physician

## 2017-05-24 NOTE — Assessment & Plan Note (Signed)
Small interstitial articular sided tear of the supraspinatus. Nitroglycerin protocol Rotator cuff strengthening exercises focusing on eccentric phase of supraspinatus strengthening with scapular stabilization. Avoidance of exacerbating activities discussed Follow-up in 6 weeks for repeat ultrasound

## 2017-05-24 NOTE — Patient Instructions (Signed)

## 2017-06-27 DIAGNOSIS — G5761 Lesion of plantar nerve, right lower limb: Secondary | ICD-10-CM | POA: Diagnosis not present

## 2017-06-27 DIAGNOSIS — M2042 Other hammer toe(s) (acquired), left foot: Secondary | ICD-10-CM | POA: Diagnosis not present

## 2017-06-27 DIAGNOSIS — M79672 Pain in left foot: Secondary | ICD-10-CM | POA: Diagnosis not present

## 2017-06-27 DIAGNOSIS — G5762 Lesion of plantar nerve, left lower limb: Secondary | ICD-10-CM | POA: Diagnosis not present

## 2017-07-05 ENCOUNTER — Ambulatory Visit (INDEPENDENT_AMBULATORY_CARE_PROVIDER_SITE_OTHER): Payer: Medicare Other | Admitting: Sports Medicine

## 2017-07-05 ENCOUNTER — Encounter: Payer: Self-pay | Admitting: Sports Medicine

## 2017-07-05 VITALS — BP 102/72 | HR 72 | Ht 66.0 in

## 2017-07-05 DIAGNOSIS — M7742 Metatarsalgia, left foot: Secondary | ICD-10-CM

## 2017-07-05 DIAGNOSIS — M7501 Adhesive capsulitis of right shoulder: Secondary | ICD-10-CM | POA: Diagnosis not present

## 2017-07-05 DIAGNOSIS — M7741 Metatarsalgia, right foot: Secondary | ICD-10-CM

## 2017-07-05 DIAGNOSIS — M2042 Other hammer toe(s) (acquired), left foot: Secondary | ICD-10-CM | POA: Diagnosis not present

## 2017-07-05 DIAGNOSIS — M75101 Unspecified rotator cuff tear or rupture of right shoulder, not specified as traumatic: Secondary | ICD-10-CM

## 2017-07-05 DIAGNOSIS — M2041 Other hammer toe(s) (acquired), right foot: Secondary | ICD-10-CM | POA: Diagnosis not present

## 2017-07-05 NOTE — Patient Instructions (Signed)
Look into having your insurance company cover a set of custom orthotics.  The code is L3030 and there are 2 units.  You can call them  and ask if this is covered.  I am happy to do these for you at any time, you just need to let our front office schedulers know you would like an "orthotic appointment."   Keep doing your exercises and using the NITRO patch. We will likely ultrasound you when you come back in 6 weeks.  If you have any worsening we can consider an injection

## 2017-07-05 NOTE — Assessment & Plan Note (Signed)
Forefoot pain bilaterally with transverse arch breakdown including hammer toe deformities and bunionette formation.  Small metatarsal pads added to her dress shoes today.  We discussed the option for custom cushion orthotic given the abnormal gait, breakdown of her transverse and longitudinal arches and acquired forefoot abnormalities.  She will call to schedule custom [facility] Orthotics at her convenience

## 2017-07-05 NOTE — Assessment & Plan Note (Signed)
Overall her rotator cuff strength has improved and she is no longer having focal pain with supraspinatus testing.  I suspect however this is not completely healed especially given the fact that it was present for such a long time.  I would like for her to continue with nitroglycerin protocol since she is tolerating it well and continue with her therapeutic exercises.  I would like to repeat her ultrasound in 6 weeks once she has almost completely asymptomatic.

## 2017-07-05 NOTE — Assessment & Plan Note (Signed)
She has very mild adhesive capsulitis that I believe already into the thawing phase.  We will have her continue working on the therapeutic exercises previously prescribed, nitroglycerin protocol and can consider intra-articular injections if she would like however she would prefer to hold off on interventions and I believe once again she is already progressed through the painful, freezing phases.

## 2017-07-05 NOTE — Progress Notes (Signed)
OFFICE VISIT NOTE Heather Rivera. Heather Rivera, [facility]  [name] - 66 y.o. female MRN [id number]  Date of birth: August 21, 1951  Visit Date: 07/05/2017  PCP: Hoyt Koch, MD   Referred by: Gayla Doss, CMA acting as scribe for Dr. Paulla Fore.  SUBJECTIVE:   Chief Complaint  Patient presents with  . Follow-up    rt shoulder pain   HPI: As below and per problem based documentation when appropriate.  [name] is an established patient presenting today in follow-up of rotator cuff syndrome, right side. She was last seen 05/24/17 and advised to follow Nitrol Protocol and do home therapeutic exercises.   Pt is using Nitroglycerin Patches. She experience HA the first day but no side effects since then. She is doing her home exercises about 4 times weekly with no trouble. Pt reports that her shoulder is better but she still has decreased ROM. The pain flares up if she sleeps on her shoulder wrong. She has pain reaching back with her arm overhead. She no longer has pain reaching back with her arm by her side. She is not taking any oral medications for the pain. She has not been using heat or ice on the shoulder. She has the most difficulty reaching up and back, her arm "just stops".   She also reports chronic daily pain of her bilateral feet with claw toe deformities and bunionette formation.  She has not tried custom orthotics in the past.  She was stressed shoes on a regular basis and this seems to exacerbate her symptoms.  She feels better with cushioning    Review of Systems  Constitutional: Negative for chills and fever.  Respiratory: Negative for shortness of breath and wheezing.   Cardiovascular: Negative for chest pain and palpitations.  Musculoskeletal: Positive for joint pain. Negative for falls.  Neurological: Positive for tingling (at night when sleeping). Negative for dizziness and  headaches.  Endo/Heme/Allergies: Does not bruise/bleed easily.    Otherwise per HPI.  HISTORY & PERTINENT PRIOR DATA:  No specialty comments available. She reports that she quit smoking about 38 years ago. She has never used smokeless tobacco.   Recent Labs  11/27/16 0939  HGBA1C 5.8   Medications & Allergies reviewed per EMR Patient Active Problem List   Diagnosis Date Noted  . Hammer toes, bilateral 07/05/2017  . Metatarsalgia of both feet 07/05/2017  . Adhesive capsulitis of right shoulder 07/05/2017  . Rotator cuff syndrome of right shoulder 05/24/2017  . Acute upper respiratory infection 02/17/2017  . Routine general medical examination at a health care facility 02/10/2015  . Overweight (BMI 25.0-29.9) 01/05/2011  . Allergic rhinitis 10/23/2010   Past Medical History:  Diagnosis Date  . ALLERGIC RHINITIS   . Female pattern baldness   . OBESITY    Family History  Problem Relation Age of Onset  . Alcohol abuse Mother   . Arthritis Mother        grandparent   Past Surgical History:  Procedure Laterality Date  . [place]  . glass removed from finger  2010   Social History   Occupational History  . Not on file.   Social History Main Topics  . Smoking status: Former Smoker    Quit date: 11/12/1978  . Smokeless tobacco: Never Used     Comment: Divorced, single and lives alone. 2 grown kids. 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 Alcohol use No  . Drug use: No  . Sexual activity: Not on file    OBJECTIVE:  VS:  HT:5\' 6"  (167.6 cm)   WT: (Pt declines)  BMI:     BP:102/72  HR:72bpm  TEMP: ( )  RESP:95 % EXAM: Findings:  Adult female.  No acute distress.  Alert and appropriate.  Right shoulder has overall improved range of motion with the exception of terminal abduction and flexion.  Held at 30 of abduction she has an 60 of arc on the right compared to 160 on the left.  Her internal rotation, external rotation, empty  can testing, speeds testing in Yergason's testing has normalized and strength is 5- out of 5.   Bilateral lower extremities are overall well aligned with high cavus foot and splay toe deformity between first and second toes bilaterally and claw toe deformities left.  DP and PT pulses are 2+/4 lower extremity sensation is intact to light touch.     No results found. ASSESSMENT & PLAN:     ICD-10-CM   1. Rotator cuff syndrome of right shoulder M75.101   2. Metatarsalgia of both feet M77.41    M77.42   3. Hammer toes, bilateral M20.41    M20.42   4. Adhesive capsulitis of right shoulder M75.01   ================================================================= Rotator cuff syndrome of right shoulder Overall her rotator cuff strength has improved and she is no longer having focal pain with supraspinatus testing.  I suspect however this is not completely healed especially given the fact that it was present for such a long time.  I would like for her to continue with nitroglycerin protocol since she is tolerating it well and continue with her therapeutic exercises.  I would like to repeat her ultrasound in 6 weeks once she has almost completely asymptomatic.  Metatarsalgia of both feet Forefoot pain bilaterally with transverse arch breakdown including hammer toe deformities and bunionette formation.  Small metatarsal pads added to her dress shoes today.  We discussed the option for custom cushion orthotic given the abnormal gait, breakdown of her transverse and longitudinal arches and acquired forefoot abnormalities.  She will call to schedule custom [facility] [employer] at her convenience  Adhesive capsulitis of right shoulder She has very mild adhesive capsulitis that I believe already into the thawing phase.  We will have her continue working on the therapeutic exercises previously prescribed, nitroglycerin protocol and can consider intra-articular injections if she would like however she would  prefer to hold off on interventions and I believe once again she is already progressed through the painful, freezing phases. ================================================================= Patient Instructions  Look into having your insurance company cover a set of custom orthotics.  The code is L3030 and there are 2 units.  You can call them  and ask if this is covered.  I am happy to do these for you at any time, you just need to let our front office schedulers know you would like an "orthotic appointment."   Keep doing your exercises and using the NITRO patch. We will likely ultrasound you when you come back in 6 weeks.  If you have any worsening we can consider an injection ================================================================= Future Appointments Date Time Provider [place]  08/06/2017 8:40 AM Gerda Diss, DO [facility] None    Follow-up: Return in about 6 weeks (around 08/16/2017) for and please make an Orthotic Visit for [place].   CMA/ATC served as Education administrator during this visit. History, Physical, and Plan performed  by medical provider. Documentation and orders reviewed and attested to.      Teresa Coombs, [facility] Physician

## 2017-07-08 ENCOUNTER — Telehealth: Payer: Self-pay | Admitting: Sports Medicine

## 2017-07-08 NOTE — Telephone Encounter (Signed)
Spoke with patient and advised that rx is not needed if orthotics are made by Dr. Paulla Fore. Pt verbalized understanding. OV scheduled for 07/10/2017.

## 2017-07-08 NOTE — Telephone Encounter (Signed)
Patient calling to inform that orthotics will be covered by insurance, if prescription is put in. Patient already has another upcoming appointment.

## 2017-07-10 ENCOUNTER — Ambulatory Visit (INDEPENDENT_AMBULATORY_CARE_PROVIDER_SITE_OTHER): Payer: Medicare Other | Admitting: Sports Medicine

## 2017-07-10 VITALS — BP 110/72 | HR 83 | Ht 66.0 in

## 2017-07-10 DIAGNOSIS — M2041 Other hammer toe(s) (acquired), right foot: Secondary | ICD-10-CM

## 2017-07-10 DIAGNOSIS — M7742 Metatarsalgia, left foot: Secondary | ICD-10-CM | POA: Diagnosis not present

## 2017-07-10 DIAGNOSIS — R269 Unspecified abnormalities of gait and mobility: Secondary | ICD-10-CM

## 2017-07-10 DIAGNOSIS — M7741 Metatarsalgia, right foot: Secondary | ICD-10-CM | POA: Diagnosis not present

## 2017-07-10 DIAGNOSIS — M2042 Other hammer toe(s) (acquired), left foot: Secondary | ICD-10-CM

## 2017-07-10 NOTE — Progress Notes (Signed)
OFFICE VISIT NOTE Heather Rivera. Heather Rivera, [facility] [place]  Heather Rivera - 66 y.o. female MRN [id number]  Date of birth: [birth year]  Visit Date: 07/10/2017  PCP: Hoyt Koch, MD   Referred by: Gayla Doss, CMA acting as scribe for Dr. Paulla Fore.  SUBJECTIVE:   Chief Complaint  Patient presents with  . Follow-up    foot pain   HPI: As below and per problem based documentation when appropriate.  Heather Rivera is an established patient presenting today in follow-up of metatarsalgia. She was provided with metatarsal pads at her last visit but would like to have custom orthotics made today.     Review of Systems  Constitutional: Negative for chills and fever.  Respiratory: Negative for shortness of breath and wheezing.   Cardiovascular: Negative for chest pain and palpitations.  Musculoskeletal: Negative for falls.  Neurological: Negative for dizziness, tingling and headaches.  Endo/Heme/Allergies: Does not bruise/bleed easily.    Otherwise per HPI.  HISTORY & PERTINENT PRIOR DATA:  No specialty comments available. She reports that she quit smoking about 38 years ago. She has never used smokeless tobacco.   Recent Labs  11/27/16 0939  HGBA1C 5.8   Medications & Allergies reviewed per EMR Patient Active Problem List   Diagnosis Date Noted  . Hammer toes, bilateral 07/05/2017  . Metatarsalgia of both feet 07/05/2017  . Adhesive capsulitis of right shoulder 07/05/2017  . Rotator cuff syndrome of right shoulder 05/24/2017  . Acute upper respiratory infection 02/17/2017  . Routine general medical examination at a health care facility 02/10/2015  . Overweight (BMI 25.0-29.9) 01/05/2011  . Allergic rhinitis 10/23/2010   Past Medical History:  Diagnosis Date  . ALLERGIC RHINITIS   . Female pattern baldness   . OBESITY    Family History  Problem Relation Age of Onset  . Alcohol abuse  Mother   . Arthritis Mother        grandparent   Past Surgical History:  Procedure Laterality Date  . [place]  . glass removed from finger  2010   Social History   Occupational History  . Not on file.   Social History Main Topics  . Smoking status: Former Smoker    Quit date: 11/12/1978  . Smokeless tobacco: Never Used     Comment: Divorced, single and lives alone. 2 grown kids. 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 Alcohol use No  . Drug use: No  . Sexual activity: Not on file    OBJECTIVE:  VS:  HT:5\' 6"  (167.6 cm)   WT:   BMI:     BP:110/72  HR:83bpm  TEMP: ( )  RESP:97 % EXAM: Findings:  Adult female.  No acute distress.  Alert and appropriate.  Bilateral high cavus arch with TTP over the metatarsal heads.  She has early claw toe deformities over the second third and fourth toes bilaterally left worse than right.  She does walk with an antalgic gait with clots on this improves following the orthotic fabrication.     No results found. ASSESSMENT & PLAN:     ICD-10-CM   1. Metatarsalgia of both feet M77.41    M77.42   2. Hammer toes, bilateral M20.41    M20.42   3. Gait disturbance R26.9   ================================================================= PROCEDURE: CUSTOM ORTHOTIC FABRICATION Patient's underlying musculoskeletal conditions are directly related to poor biomechanics and will benefit from  a functional custom orthotic.  There are no significant foot deformities that complicate the use of a custom orthotic.  The patient was fitted for a standard, cushioned, semi-rigid orthotic. The orthotic was heated & placed on the orthotic stand. The patient was positioned in subtalar neutral position and 10 of ankle dorsiflexion and weight bearing stance some heated orthotic blank. After completion of the molding a stable paste was applied to the orthotic blank. The orthotic was ground to a stable position for weightbearing.  The patient ambulated in these and reported they were comfortable without pressure spots.              BLANK:  Size 10W - Thin Cushioned                 BASE:  Blue EVA      POSTINGS:  n/a   ================================================================= There are no Patient Instructions on file for this visit.=================================================================  Follow-up: Return if symptoms worsen or fail to improve.   CMA/ATC served as Education administrator during this visit. History, Physical, and Plan performed by medical provider. Documentation and orders reviewed and attested to.      Teresa Coombs, [facility] Physician

## 2017-07-12 ENCOUNTER — Other Ambulatory Visit: Payer: Self-pay | Admitting: Internal Medicine

## 2017-07-16 NOTE — Telephone Encounter (Signed)
Check [place] registry last filled date not on file...Heather Rivera

## 2017-07-16 NOTE — Telephone Encounter (Signed)
Faxed script back to [facility] pharmacy.../lmb  

## 2017-08-06 ENCOUNTER — Ambulatory Visit: Payer: Medicare Other | Admitting: Sports Medicine

## 2017-09-03 ENCOUNTER — Ambulatory Visit: Payer: Medicare Other | Admitting: Sports Medicine

## 2017-09-27 DIAGNOSIS — H524 Presbyopia: Secondary | ICD-10-CM | POA: Diagnosis not present

## 2017-09-27 DIAGNOSIS — H2513 Age-related nuclear cataract, bilateral: Secondary | ICD-10-CM | POA: Diagnosis not present

## 2017-12-12 ENCOUNTER — Ambulatory Visit (INDEPENDENT_AMBULATORY_CARE_PROVIDER_SITE_OTHER): Payer: Medicare Other | Admitting: Internal Medicine

## 2017-12-12 ENCOUNTER — Encounter: Payer: Self-pay | Admitting: Internal Medicine

## 2017-12-12 DIAGNOSIS — J069 Acute upper respiratory infection, unspecified: Secondary | ICD-10-CM

## 2017-12-12 MED ORDER — AMOXICILLIN-POT CLAVULANATE 875-125 MG PO TABS: 1.0000 | ORAL_TABLET | Freq: Two times a day (BID) | ORAL | 0 refills | Status: DC

## 2017-12-12 NOTE — Patient Instructions (Addendum)
We have sent in augmentin to take 1 pill twice a day for 1 week.   Saline nose spray is something to try.   For the weight you can try belviq, qsymia, contrave which are all safe for the weight.

## 2017-12-12 NOTE — Progress Notes (Signed)
   Subjective:    Patient ID: Heather Rivera, female    DOB: 07-23-1951, 67 y.o.   MRN: [id number]  HPI The patient is a 67 YO female coming in for sinus problems. Started about 3-4 days ago with some pressure and congestion. She is taking advil and honey to help. She is now coughing, no SOB, ear pain and pressure. She is having headaches. Denies fevers some chills. Denies muscle aches.   Review of Systems  Constitutional: Positive for activity change, appetite change and chills. Negative for fatigue, fever and unexpected weight change.  HENT: Positive for congestion, postnasal drip, rhinorrhea and sinus pressure. Negative for ear discharge, ear pain, sinus pain, sneezing, sore throat, tinnitus, trouble swallowing and voice change.   Eyes: Negative.   Respiratory: Positive for cough. Negative for chest tightness, shortness of breath and wheezing.   Cardiovascular: Negative.   Gastrointestinal: Negative.   Musculoskeletal: Positive for myalgias.  Neurological: Negative.       Objective:   Physical Exam  Constitutional: She is oriented to person, place, and time. She appears well-developed and well-nourished.  HENT:  Head: Normocephalic and atraumatic.  Oropharynx with redness and clear drainage, nose with swollen turbinates, TMs normal bilaterally  Eyes: EOM are normal.  Neck: Normal range of motion. No thyromegaly present.  Cardiovascular: Normal rate and regular rhythm.  Pulmonary/Chest: Effort normal and breath sounds normal. No respiratory distress. She has no wheezes. She has no rales.  Abdominal: Soft.  Lymphadenopathy:    She has no cervical adenopathy.  Neurological: She is alert and oriented to person, place, and time.  Skin: Skin is warm and dry.   Vitals:   12/12/17 1550  BP: 122/84  Pulse: 70  Temp: 97.8 F (36.6 C)  TempSrc: Oral  SpO2: 98%  Weight: 186 lb (84.4 kg)  Height: 5\' 6"  (1.676 m)      Assessment & Plan:

## 2017-12-13 NOTE — Assessment & Plan Note (Signed)
Likely viral in origin and rx for augmentin to start taking next week if not improved. Advised to use her flonase she has at home and start zyrtec to help. Can use cough medicine otc which is helping so far.

## 2018-02-24 ENCOUNTER — Encounter: Payer: Self-pay | Admitting: Internal Medicine

## 2018-02-24 ENCOUNTER — Ambulatory Visit (INDEPENDENT_AMBULATORY_CARE_PROVIDER_SITE_OTHER): Payer: Medicare Other | Admitting: Internal Medicine

## 2018-02-24 DIAGNOSIS — R21 Rash and other nonspecific skin eruption: Secondary | ICD-10-CM

## 2018-02-24 MED ORDER — KETOCONAZOLE 2 % EX CREA: 1.0000 "application " | TOPICAL_CREAM | Freq: Two times a day (BID) | CUTANEOUS | 0 refills | Status: DC

## 2018-02-24 NOTE — Patient Instructions (Signed)
We have sent in a cream to use twice a day on the rash for 1-2 weeks or until the rash is gone. You can keep the rest of the cream if needed.

## 2018-02-24 NOTE — Progress Notes (Signed)
   Subjective:    Patient ID: Heather Rivera, female    DOB: September 06, 1951, 67 y.o.   MRN: [id number]  HPI The patient is a 67 YO female coming in for rash under left breast. Not painful except with rubbing clothes. Denies fevers or chills. Started within the last week. No new soaps, detergent, products. Does have some metal sensitivity and wondered if this was related to underwire.   Review of Systems  Constitutional: Negative.   Respiratory: Negative for cough, chest tightness and shortness of breath.   Cardiovascular: Negative for chest pain, palpitations and leg swelling.  Gastrointestinal: Negative for abdominal distention, abdominal pain, constipation, diarrhea, nausea and vomiting.  Musculoskeletal: Negative.   Skin: Positive for rash.      Objective:   Physical Exam  Constitutional: She is oriented to person, place, and time. She appears well-developed and well-nourished.  HENT:  Head: Normocephalic and atraumatic.  Eyes: EOM are normal.  Neck: Normal range of motion.  Cardiovascular: Normal rate and regular rhythm.  Pulmonary/Chest: Effort normal.  Abdominal: Soft.  Neurological: She is alert and oriented to person, place, and time. Coordination normal.  Skin: Skin is warm and dry. Rash noted.  Red rash under the left breast   Vitals:   02/24/18 1552  BP: 102/80  Pulse: 73  Temp: 97.7 F (36.5 C)  TempSrc: Oral  SpO2: 96%  Weight: 184 lb (83.5 kg)  Height: 5\' 6"  (1.676 m)      Assessment & Plan:

## 2018-02-25 ENCOUNTER — Encounter: Payer: Self-pay | Admitting: Internal Medicine

## 2018-02-25 NOTE — Assessment & Plan Note (Signed)
Rx for ketoconazole cream to use for likely candidal skin infection.

## 2018-03-06 ENCOUNTER — Telehealth: Payer: Self-pay | Admitting: Internal Medicine

## 2018-03-06 MED ORDER — FLUCONAZOLE 150 MG PO TABS: 150.0000 mg | ORAL_TABLET | Freq: Once | ORAL | 0 refills | Status: AC

## 2018-03-06 NOTE — Telephone Encounter (Signed)
Copied from [place]. Topic: Quick Communication - See Telephone Encounter >> Mar 06, 2018  8:47 AM Ivar Drape wrote: CRM for notification. See Telephone encounter for: 03/06/18. The patient was in to see the provider last Monday for a Yeast Infection under her breast, but the medication that was prescribed only worked about 30%.  So it's still there.  Please advise.

## 2018-03-06 NOTE — Telephone Encounter (Signed)
Sent in diflucan pill to take which should help this clear more.

## 2018-03-06 NOTE — Telephone Encounter (Signed)
Patient informed. 

## 2018-03-10 ENCOUNTER — Encounter: Payer: Self-pay | Admitting: Internal Medicine

## 2018-03-10 ENCOUNTER — Ambulatory Visit (INDEPENDENT_AMBULATORY_CARE_PROVIDER_SITE_OTHER): Payer: Medicare Other | Admitting: Internal Medicine

## 2018-03-10 DIAGNOSIS — R21 Rash and other nonspecific skin eruption: Secondary | ICD-10-CM

## 2018-03-10 MED ORDER — VALACYCLOVIR HCL 1 G PO TABS: 1000.0000 mg | ORAL_TABLET | Freq: Three times a day (TID) | ORAL | 0 refills | Status: DC

## 2018-03-10 MED ORDER — CLOTRIMAZOLE-BETAMETHASONE 1-0.05 % EX CREA: 1.0000 "application " | TOPICAL_CREAM | Freq: Two times a day (BID) | CUTANEOUS | 0 refills | Status: DC

## 2018-03-10 NOTE — Assessment & Plan Note (Signed)
Although this still appears to be skin candida will cover for shingles given the blistering appearance. Change to lotrisone cream and add valtrex TID for 1 week.

## 2018-03-10 NOTE — Progress Notes (Signed)
   Subjective:    Patient ID: Heather Rivera, female    DOB: Feb 27, 1951, 67 y.o.   MRN: [id number]  HPI The patient is a 67 YO female coming in for rash under her breasts. Seen about 10 days ago for same and given ketoconazole cream which did help some the first day. Then it worsened in the next 1-2 days. She still had the rash and so called and got diflucan which did not help much. Still itching some and burning. Overall some decrease in the redness. Looks a little more blistering now. Some pain in waves which feels kind of burning. Advil is helping for the pain.   Review of Systems  Constitutional: Negative.   Respiratory: Negative for cough, chest tightness and shortness of breath.   Cardiovascular: Negative for chest pain, palpitations and leg swelling.  Gastrointestinal: Negative for abdominal distention, abdominal pain, constipation, diarrhea, nausea and vomiting.  Musculoskeletal: Negative.   Skin: Positive for rash.       itching  Neurological: Negative.   Psychiatric/Behavioral: Negative.       Objective:   Physical Exam  Constitutional: She is oriented to person, place, and time. She appears well-developed and well-nourished.  HENT:  Head: Normocephalic and atraumatic.  Eyes: EOM are normal.  Neck: Normal range of motion.  Cardiovascular: Normal rate and regular rhythm.  Pulmonary/Chest: Effort normal and breath sounds normal. No respiratory distress. She has no wheezes. She has no rales.  Abdominal: Soft. She exhibits no distension. There is no tenderness. There is no rebound.  Musculoskeletal: She exhibits no edema.  Neurological: She is alert and oriented to person, place, and time. Coordination normal.  Skin: Skin is warm and dry. Rash noted.  Some blisters between the breasts and under the breasts bilaterally, a few satellite lesions   Vitals:   03/10/18 1529  BP: 108/80  Pulse: 71  Temp: 98.2 F (36.8 C)  TempSrc: Oral  SpO2: 93%  Weight: 184 lb (83.5 kg)    Height: 5\' 6"  (1.676 m)      Assessment & Plan:

## 2018-03-10 NOTE — Patient Instructions (Signed)
We have sent in a stronger cream to use 3 times per day.   We have also sent in the valtrex to take 1 pill 3 times a day for 1 week.

## 2018-06-04 NOTE — Progress Notes (Signed)
Subjective:   Heather Rivera is a 67 y.o. female who presents for Medicare Annual (Subsequent) preventive examination.  Review of Systems:  No ROS.  Medicare Wellness Visit. Additional risk factors are reflected in the social history.  Cardiac Risk Factors include: advanced age (>54men, >34 women) Sleep patterns: gets up 1-2 times nightly to void and sleeps 6-7 hours nightly.    Home Safety/Smoke Alarms: Feels safe in home. Smoke alarms in place.  Living environment; residence and Firearm Safety: 1-story house/ trailer, no firearms. Lives alone, no needs for DME, good support system Seat Belt Safety/Bike Helmet: Wears seat belt.      Objective:     Vitals: BP 126/72   Pulse 66   Resp 18   Ht 5\' 6"  (1.676 m)   Wt 186 lb (84.4 kg)   SpO2 96%   BMI 30.02 kg/m   Body mass index is 30.02 kg/m.  Advanced Directives 06/05/2018  Does Patient Have a Medical Advance Directive? Yes  Type of Paramedic of [place];Living will  Copy of [place] in Chart? No - copy requested    Tobacco Social History   Tobacco Use  Smoking Status Former Smoker  . Last attempt to quit: 11/12/1978  . Years since quitting: 39.5  Smokeless Tobacco Never Used  Tobacco Comment   Divorced, single and lives alone. 2 grown kids. employed with hotel sales, [employer] and speaker presentation     Counseling given: Not Answered Comment: Divorced, single and lives alone. 2 grown kids. employed with hotel sales, [employer] and speaker presentation  Past Medical History:  Diagnosis Date  . ALLERGIC RHINITIS   . Female pattern baldness   . OBESITY    Past Surgical History:  Procedure Laterality Date  . [place]  . glass removed from finger  2010   Family History  Problem Relation Age of Onset  . Alcohol abuse Mother   . Arthritis Mother        grandparent   Social History   Socioeconomic History  . Marital status: Divorced    Spouse  name: Not on file  . Number of children: 2  . Years of education: Not on file  . Highest education level: Not on file  Occupational History  . Not on file  Social Needs  . Financial resource strain: Not hard at all  . Food insecurity:    Worry: Never true    Inability: Never true  . Transportation needs:    Medical: No    Non-medical: No  Tobacco Use  . Smoking status: Former Smoker    Last attempt to quit: 11/12/1978    Years since quitting: 39.5  . Smokeless tobacco: Never Used  . Tobacco comment: Divorced, single and lives alone. 2 grown kids. 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 Alcohol use: Yes    Alcohol/week: 3.6 oz    Types: 6 Glasses of wine per week    Comment: 6 glasses per week  . Drug use: No  . Sexual activity: Not Currently  Lifestyle  . Physical activity:    Days per week: 3 days    Minutes per session: 60 min  . Stress: Not at all  Relationships  . Social connections:    Talks on phone: More than three times a week    Gets together: More than three times a week    Attends religious service:  Not on file    Active member of club or organization: Yes    Attends meetings of clubs or organizations: More than 4 times per year    Relationship status: Divorced  Other Topics Concern  . Not on file  Social History Narrative  . Not on file    Outpatient Encounter Medications as of 06/05/2018  Medication Sig  . b complex vitamins capsule Take 1 capsule by mouth daily.    . fluticasone (FLONASE) 50 MCG/ACT nasal spray Place 2 sprays into both nostrils daily.  Marland Kitchen zolpidem (AMBIEN) 5 MG tablet TAKE 1 TABLET AT BEDTIME AS NEEDED  . [DISCONTINUED] clotrimazole-betamethasone (LOTRISONE) cream Apply 1 application topically 2 (two) times daily.  . [DISCONTINUED] ketoconazole (NIZORAL) 2 % cream Apply 1 application topically 2 (two) times daily. (Patient not taking: Reported on 06/05/2018)  . [DISCONTINUED]  nitroGLYCERIN (NITRODUR - DOSED IN MG/24 HR) 0.2 mg/hr patch Place 1/4 to 1/2 of a patch over affected region. Remove and replace once daily.  Slightly alter skin placement daily (Patient not taking: Reported on 12/12/2017)  . [DISCONTINUED] valACYclovir (VALTREX) 1000 MG tablet Take 1 tablet (1,000 mg total) by mouth 3 (three) times daily. (Patient not taking: Reported on 06/05/2018)   No facility-administered encounter medications on file as of 06/05/2018.     Activities of Daily Living In your present state of health, do you have any difficulty performing the following activities: 06/05/2018  Hearing? N  Vision? N  Difficulty concentrating or making decisions? N  Walking or climbing stairs? N  Dressing or bathing? N  Doing errands, shopping? N  Preparing Food and eating ? N  Using the Toilet? N  In the past six months, have you accidently leaked urine? N  Do you have problems with loss of bowel control? N  Managing your Medications? N  Managing your Finances? N  Housekeeping or managing your Housekeeping? N  Some recent data might be hidden    Patient Care Team: Hoyt Koch, MD as PCP - General (Internal Medicine)    Assessment:   This is a routine wellness examination for Yocelin. Physical assessment deferred to PCP.   Exercise Activities and Dietary recommendations Current Exercise Habits: Home exercise routine;Structured exercise class, Type of exercise: walking(pilates class), Time (Minutes): 60, Frequency (Times/Week): 3, Weekly Exercise (Minutes/Week): 180  Diet (meal preparation, eat out, water intake, caffeinated beverages, dairy products, fruits and vegetables): in general, a "healthy" diet  , well balanced, eats a variety of fruits and vegetables daily, limits salt, fat/cholesterol, sugar,carbohydrates,caffeine, drinks 6-8 glasses of water daily.  Discussed weight loss strategies, Diet education was provided via handout.  Goals    . Patient Stated     I want  to continue to be as physically active as possible. Enjoy life and family.       Fall Risk Fall Risk  06/05/2018 09/19/2015  Falls in the past year? No No    Depression Screen PHQ 2/9 Scores 06/05/2018 09/19/2015  PHQ - 2 Score 0 0  PHQ- 9 Score 1 -     Cognitive Function       Ad8 score reviewed for issues:  Issues making decisions: no  Less interest in hobbies / activities: no  Repeats questions, stories (family complaining): no  Trouble using ordinary gadgets (microwave, computer, phone):no  Forgets the month or year: no  Mismanaging finances: no  Remembering appts: no  Daily problems with thinking and/or memory: no Ad8 score is= 0  Immunization History  Administered  Date(s) Administered  . Influenza, High Dose Seasonal PF 09/10/2016  . Influenza-Unspecified 08/17/2014, 11/16/2017  . Pneumococcal Conjugate-13 02/09/2015, 08/29/2016  . Pneumococcal Polysaccharide-23 08/29/2016, 06/05/2018  . Td 11/12/2008  . Zoster 12/28/2009   Screening Tests Health Maintenance  Topic Date Due  . Hepatitis C Screening  [birth year]  . COLONOSCOPY  04/23/1999  . MAMMOGRAM  03/08/2008  . DEXA SCAN  04/22/2014  . INFLUENZA VACCINE  06/12/2018  . TETANUS/TDAP  11/12/2018  . PNA vac Low Risk Adult  Completed       Plan:     Patient declined referrals for screening colonoscopy and/or Cologuard, mammogram, and Dexa Scan. Education was provided.   Continue doing brain stimulating activities (puzzles, reading, adult coloring books, staying active) to keep memory sharp.    Continue to eat heart healthy diet (full of fruits, vegetables, whole grains, lean protein, water--limit salt, fat, and sugar intake) and increase physical activity as tolerated.  I have personally reviewed and noted the following in the patient's chart:   . Medical and social history . Use of alcohol, tobacco or illicit drugs  . Current medications and supplements . Functional ability and  status . Nutritional status . Physical activity . Advanced directives . List of other physicians . Vitals . Screenings to include cognitive, depression, and falls . Referrals and appointments  In addition, I have reviewed and discussed with patient certain preventive protocols, quality metrics, and best practice recommendations. A written personalized care plan for preventive services as well as general preventive health recommendations were provided to patient.     Michiel Cowboy, RN  06/05/2018

## 2018-06-05 ENCOUNTER — Ambulatory Visit (INDEPENDENT_AMBULATORY_CARE_PROVIDER_SITE_OTHER): Payer: Medicare Other | Admitting: *Deleted

## 2018-06-05 ENCOUNTER — Telehealth: Payer: Self-pay | Admitting: *Deleted

## 2018-06-05 VITALS — BP 126/72 | HR 66 | Resp 18 | Ht 66.0 in | Wt 186.0 lb

## 2018-06-05 DIAGNOSIS — Z0000 Encounter for general adult medical examination without abnormal findings: Secondary | ICD-10-CM | POA: Diagnosis not present

## 2018-06-05 DIAGNOSIS — Z23 Encounter for immunization: Secondary | ICD-10-CM

## 2018-06-05 NOTE — Telephone Encounter (Signed)
During AWV, patient asked if she could have a refill for ambien. She explained that she has taken Singulair for allergies and it works well, she requested to have a prescription for this medication. Also, patient states that she has gained weight recently and PCP has prescribed phentermine in the past for her. Patient would like to have a prescription for this medication.

## 2018-06-05 NOTE — Patient Instructions (Addendum)
[place] at [facility] in [place], [place] in: [facility] Address: [address], [place], [place] [address] Phone: [phone]  Continue doing brain stimulating activities (puzzles, reading, adult coloring books, staying active) to keep memory sharp.   Continue to eat heart healthy diet (full of fruits, vegetables, whole grains, lean protein, water--limit salt, fat, and sugar intake) and increase physical activity as tolerated.  Ms. Baskins , Thank you for taking time to come for your Medicare Wellness Visit. I appreciate your ongoing commitment to your health goals. Please review the following plan we discussed and let me know if I can assist you in the future.   These are the goals we discussed: Goals    . Patient Stated     I want to continue to be as physically active as possible. Enjoy life and family.       This is a list of the screening recommended for you and due dates:  Health Maintenance  Topic Date Due  .  Hepatitis C: One time screening is recommended by Center for Disease Control  (CDC) for  adults born from [birth year] through 1967.   [birth year]  . Colon Cancer Screening  04/23/1999  . Mammogram  03/08/2008  . DEXA scan (bone density measurement)  04/22/2014  . Pneumonia vaccines (2 of 2 - PPSV23) 08/29/2017  . Flu Shot  06/12/2018  . Tetanus Vaccine  11/12/2018   Health Maintenance, Female Adopting a healthy lifestyle and getting preventive care can go a long way to promote health and wellness. Talk with your health care provider about what schedule of regular examinations is right for you. This is a good chance for you to check in with your provider about disease prevention and staying healthy. In between checkups, there are plenty of things you can do on your own. Experts have done a lot of research about which lifestyle changes and preventive measures are most likely to keep you healthy. Ask your  health care provider for more information. Weight and diet Eat a healthy diet  Be sure to include plenty of vegetables, fruits, low-fat dairy products, and lean protein.  Do not eat a lot of foods high in solid fats, added sugars, or salt.  Get regular exercise. This is one of the most important things you can do for your health. ? Most adults should exercise for at least 150 minutes each week. The exercise should increase your heart rate and make you sweat (moderate-intensity exercise). ? Most adults should also do strengthening exercises at least twice a week. This is in addition to the moderate-intensity exercise.  Maintain a healthy weight  Body mass index (BMI) is a measurement that can be used to identify possible weight problems. It estimates body fat based on height and weight. Your health care provider can help determine your BMI and help you achieve or maintain a healthy weight.  For females 77 years of age and older: ? A BMI below 18.5 is considered underweight. ? A BMI of 18.5 to 24.9 is normal. ? A BMI of 25 to 29.9 is considered overweight. ? A BMI of 30 and above is considered obese.  Watch levels of cholesterol and blood lipids  You should start having your blood tested for lipids and cholesterol at 69 years of age, then have this test every 5 years.  You may need to have your cholesterol levels checked more often if: ? Your lipid or cholesterol  levels are high. ? You are older than 69 years of age. ? You are at high risk for heart disease.  Cancer screening Lung Cancer  Lung cancer screening is recommended for adults 20-9 years old who are at high risk for lung cancer because of a history of smoking.  A yearly low-dose CT scan of the lungs is recommended for people who: ? Currently smoke. ? Have quit within the past 15 years. ? Have at least a 30-pack-year history of smoking. A pack year is smoking an average of one pack of cigarettes a day for 1  year.  Yearly screening should continue until it has been 15 years since you quit.  Yearly screening should stop if you develop a health problem that would prevent you from having lung cancer treatment.  Breast Cancer  Practice breast self-awareness. This means understanding how your breasts normally appear and feel.  It also means doing regular breast self-exams. Let your health care provider know about any changes, no matter how small.  If you are in your 20s or 30s, you should have a clinical breast exam (CBE) by a health care provider every 1-3 years as part of a regular health exam.  If you are 47 or older, have a CBE every year. Also consider having a breast X-ray (mammogram) every year.  If you have a family history of breast cancer, talk to your health care provider about genetic screening.  If you are at high risk for breast cancer, talk to your health care provider about having an MRI and a mammogram every year.  Breast cancer gene (BRCA) assessment is recommended for women who have family members with BRCA-related cancers. BRCA-related cancers include: ? Breast. ? Ovarian. ? Tubal. ? Peritoneal cancers.  Results of the assessment will determine the need for genetic counseling and BRCA1 and BRCA2 testing.  Cervical Cancer Your health care provider may recommend that you be screened regularly for cancer of the pelvic organs (ovaries, uterus, and vagina). This screening involves a pelvic examination, including checking for microscopic changes to the surface of your cervix (Pap test). You may be encouraged to have this screening done every 3 years, beginning at age 53.  For women ages 69-65, health care providers may recommend pelvic exams and Pap testing every 3 years, or they may recommend the Pap and pelvic exam, combined with testing for human papilloma virus (HPV), every 5 years. Some types of HPV increase your risk of cervical cancer. Testing for HPV may also be done on  women of any age with unclear Pap test results.  Other health care providers may not recommend any screening for nonpregnant women who are considered low risk for pelvic cancer and who do not have symptoms. Ask your health care provider if a screening pelvic exam is right for you.  If you have had past treatment for cervical cancer or a condition that could lead to cancer, you need Pap tests and screening for cancer for at least 20 years after your treatment. If Pap tests have been discontinued, your risk factors (such as having a new sexual partner) need to be reassessed to determine if screening should resume. Some women have medical problems that increase the chance of getting cervical cancer. In these cases, your health care provider may recommend more frequent screening and Pap tests.  Colorectal Cancer  This type of cancer can be detected and often prevented.  Routine colorectal cancer screening usually begins at 69 years of age and continues  through 69 years of age.  Your health care provider may recommend screening at an earlier age if you have risk factors for colon cancer.  Your health care provider may also recommend using home test kits to check for hidden blood in the stool.  A small camera at the end of a tube can be used to examine your colon directly (sigmoidoscopy or colonoscopy). This is done to check for the earliest forms of colorectal cancer.  Routine screening usually begins at age 65.  Direct examination of the colon should be repeated every 5-10 years through 69 years of age. However, you may need to be screened more often if early forms of precancerous polyps or small growths are found.  Skin Cancer  Check your skin from head to toe regularly.  Tell your health care provider about any new moles or changes in moles, especially if there is a change in a mole's shape or color.  Also tell your health care provider if you have a mole that is larger than the size of a  pencil eraser.  Always use sunscreen. Apply sunscreen liberally and repeatedly throughout the day.  Protect yourself by wearing long sleeves, pants, a wide-brimmed hat, and sunglasses whenever you are outside.  Heart disease, diabetes, and high blood pressure  High blood pressure causes heart disease and increases the risk of stroke. High blood pressure is more likely to develop in: ? People who have blood pressure in the high end of the normal range (130-139/85-89 mm Hg). ? People who are overweight or obese. ? People who are African American.  If you are 29-13 years of age, have your blood pressure checked every 3-5 years. If you are 59 years of age or older, have your blood pressure checked every year. You should have your blood pressure measured twice-once when you are at a hospital or clinic, and once when you are not at a hospital or clinic. Record the average of the two measurements. To check your blood pressure when you are not at a hospital or clinic, you can use: ? An automated blood pressure machine at a pharmacy. ? A home blood pressure monitor.  If you are between 51 years and 54 years old, ask your health care provider if you should take aspirin to prevent strokes.  Have regular diabetes screenings. This involves taking a blood sample to check your fasting blood sugar level. ? If you are at a normal weight and have a low risk for diabetes, have this test once every three years after 69 years of age. ? If you are overweight and have a high risk for diabetes, consider being tested at a younger age or more often. Preventing infection Hepatitis B  If you have a higher risk for hepatitis B, you should be screened for this virus. You are considered at high risk for hepatitis B if: ? You were born in a country where hepatitis B is common. Ask your health care provider which countries are considered high risk. ? Your parents were born in a high-risk country, and you have not been  immunized against hepatitis B (hepatitis B vaccine). ? You have HIV or AIDS. ? You use needles to inject street drugs. ? You live with someone who has hepatitis B. ? You have had sex with someone who has hepatitis B. ? You get hemodialysis treatment. ? You take certain medicines for conditions, including cancer, organ transplantation, and autoimmune conditions.  Hepatitis C  Blood testing is recommended for: ?  Everyone born from [birth year] through 1967. ? Anyone with known risk factors for hepatitis C.  Sexually transmitted infections (STIs)  You should be screened for sexually transmitted infections (STIs) including gonorrhea and chlamydia if: ? You are sexually active and are younger than 69 years of age. ? You are older than 69 years of age and your health care provider tells you that you are at risk for this type of infection. ? Your sexual activity has changed since you were last screened and you are at an increased risk for chlamydia or gonorrhea. Ask your health care provider if you are at risk.  If you do not have HIV, but are at risk, it may be recommended that you take a prescription medicine daily to prevent HIV infection. This is called pre-exposure prophylaxis (PrEP). You are considered at risk if: ? You are sexually active and do not regularly use condoms or know the HIV status of your partner(s). ? You take drugs by injection. ? You are sexually active with a partner who has HIV.  Talk with your health care provider about whether you are at high risk of being infected with HIV. If you choose to begin PrEP, you should first be tested for HIV. You should then be tested every 3 months for as long as you are taking PrEP. Pregnancy  If you are premenopausal and you may become pregnant, ask your health care provider about preconception counseling.  If you may become pregnant, take 400 to 800 micrograms (mcg) of folic acid every day.  If you want to prevent pregnancy, talk to your  health care provider about birth control (contraception). Osteoporosis and menopause  Osteoporosis is a disease in which the bones lose minerals and strength with aging. This can result in serious bone fractures. Your risk for osteoporosis can be identified using a bone density scan.  If you are 78 years of age or older, or if you are at risk for osteoporosis and fractures, ask your health care provider if you should be screened.  Ask your health care provider whether you should take a calcium or vitamin D supplement to lower your risk for osteoporosis.  Menopause may have certain physical symptoms and risks.  Hormone replacement therapy may reduce some of these symptoms and risks. Talk to your health care provider about whether hormone replacement therapy is right for you. Follow these instructions at home:  Schedule regular health, dental, and eye exams.  Stay current with your immunizations.  Do not use any tobacco products including cigarettes, chewing tobacco, or electronic cigarettes.  If you are pregnant, do not drink alcohol.  If you are breastfeeding, limit how much and how often you drink alcohol.  Limit alcohol intake to no more than 1 drink per day for nonpregnant women. One drink equals 12 ounces of beer, 5 ounces of Heather Rivera, or 1 ounces of hard liquor.  Do not use street drugs.  Do not share needles.  Ask your health care provider for help if you need support or information about quitting drugs.  Tell your health care provider if you often feel depressed.  Tell your health care provider if you have ever been abused or do not feel safe at home. This information is not intended to replace advice given to you by your health care provider. Make sure you discuss any questions you have with your health care provider. Document Released: 05/14/2011 Document Revised: 04/05/2016 Document Reviewed: 08/02/2015 Elsevier Interactive Patient Education  [employer].

## 2018-06-06 NOTE — Telephone Encounter (Signed)
Would be happy to address at a visit, these are both controlled substances. Looks like last CPE was Jan 2018.

## 2018-06-06 NOTE — Telephone Encounter (Signed)
Called and informed patient that PCP would like to discuss prescriptions during a physical visit as it has been over a year since her last physical. Patient verbalized understanding and a physical was scheduled for 06/17/18.

## 2018-06-06 NOTE — Progress Notes (Signed)
Medical screening examination/treatment/procedure(s) were performed by non-physician practitioner and as supervising physician I was immediately available for consultation/collaboration. I agree with above. Elizabeth A Crawford, MD 

## 2018-06-17 ENCOUNTER — Other Ambulatory Visit (INDEPENDENT_AMBULATORY_CARE_PROVIDER_SITE_OTHER): Payer: Medicare Other

## 2018-06-17 ENCOUNTER — Ambulatory Visit (INDEPENDENT_AMBULATORY_CARE_PROVIDER_SITE_OTHER): Payer: Medicare Other | Admitting: Internal Medicine

## 2018-06-17 ENCOUNTER — Encounter: Payer: Self-pay | Admitting: Internal Medicine

## 2018-06-17 VITALS — BP 110/82 | HR 74 | Temp 97.9°F | Ht 66.0 in | Wt 184.0 lb

## 2018-06-17 DIAGNOSIS — F5101 Primary insomnia: Secondary | ICD-10-CM | POA: Diagnosis not present

## 2018-06-17 DIAGNOSIS — G4700 Insomnia, unspecified: Secondary | ICD-10-CM | POA: Insufficient documentation

## 2018-06-17 DIAGNOSIS — Z0000 Encounter for general adult medical examination without abnormal findings: Secondary | ICD-10-CM | POA: Diagnosis not present

## 2018-06-17 DIAGNOSIS — J3089 Other allergic rhinitis: Secondary | ICD-10-CM | POA: Diagnosis not present

## 2018-06-17 LAB — COMPREHENSIVE METABOLIC PANEL
ALBUMIN: 4.3 g/dL (ref 3.5–5.2)
ALK PHOS: 55 U/L (ref 39–117)
ALT: 36 U/L — AB (ref 0–35)
AST: 19 U/L (ref 0–37)
BILIRUBIN TOTAL: 0.9 mg/dL (ref 0.2–1.2)
BUN: 15 mg/dL (ref 6–23)
CO2: 30 mEq/L (ref 19–32)
Calcium: 10 mg/dL (ref 8.4–10.5)
Chloride: 101 mEq/L (ref 96–112)
Creatinine, Ser: 0.77 mg/dL (ref 0.40–1.20)
GFR: 78.96 mL/min (ref >60.00)
GLUCOSE: 113 mg/dL — AB (ref 70–99)
POTASSIUM: 4.6 meq/L (ref 3.5–5.1)
Sodium: 137 mEq/L (ref 135–145)
TOTAL PROTEIN: 7 g/dL (ref 6.0–8.3)

## 2018-06-17 LAB — LIPID PANEL
CHOLESTEROL: 258 mg/dL — AB (ref 0–200)
HDL: 110.4 mg/dL (ref >39.00)
LDL Cholesterol: 136 mg/dL — ABNORMAL HIGH (ref 0–99)
NONHDL: 147.37
Total CHOL/HDL Ratio: 2
Triglycerides: 57 mg/dL (ref 0.0–149.0)
VLDL: 11.4 mg/dL (ref 0.0–40.0)

## 2018-06-17 LAB — CBC
HCT: 39.6 % (ref 36.0–46.0)
HEMOGLOBIN: 13.3 g/dL (ref 12.0–15.0)
MCHC: 33.5 g/dL (ref 30.0–36.0)
MCV: 89.1 fl (ref 78.0–100.0)
Platelets: 337 10*3/uL (ref 150.0–400.0)
RBC: 4.44 Mil/uL (ref 3.87–5.11)
RDW: 13.6 % (ref 11.5–15.5)
WBC: 6.7 10*3/uL (ref 4.0–10.5)

## 2018-06-17 LAB — HEMOGLOBIN A1C: Hgb A1c MFr Bld: 6 % (ref 4.6–6.5)

## 2018-06-17 MED ORDER — MONTELUKAST SODIUM 10 MG PO TABS: 10.0000 mg | ORAL_TABLET | Freq: Every day | ORAL | 3 refills | Status: DC

## 2018-06-17 MED ORDER — ZOLPIDEM TARTRATE 5 MG PO TABS: 5.0000 mg | ORAL_TABLET | Freq: Every evening | ORAL | 0 refills | Status: DC | PRN

## 2018-06-17 MED ORDER — LORCASERIN HCL ER 20 MG PO TB24: 20.0000 mg | ORAL_TABLET | Freq: Every day | ORAL | 1 refills | Status: DC

## 2018-06-17 NOTE — Assessment & Plan Note (Signed)
Flu shot yearly. Pneumonia complete. Shingrix counseled. Tetanus up to date. Colonoscopy counseled. Mammogram counseled, and dexa counseled. Counseled about sun safety and mole surveillance. Counseled about the dangers of distracted driving. Given 10 year screening recommendations.

## 2018-06-17 NOTE — Patient Instructions (Signed)

## 2018-06-17 NOTE — Assessment & Plan Note (Signed)
Rx for flonase and singulair for symptoms.

## 2018-06-17 NOTE — Progress Notes (Signed)
   Subjective:    Patient ID: Heather Rivera, female    DOB: [birth year], 67 y.o.   MRN: [id number]  HPI The patient is a 67 YO female coming in for physical. Rarely takes [place] for sleep.  PMH, [facility], social history reviewed and updated.   Review of Systems  Constitutional: Negative.   HENT: Negative.   Eyes: Negative.   Respiratory: Negative for cough, chest tightness and shortness of breath.   Cardiovascular: Negative for chest pain, palpitations and leg swelling.  Gastrointestinal: Negative for abdominal distention, abdominal pain, constipation, diarrhea, nausea and vomiting.  Musculoskeletal: Negative.   Skin: Negative.   Neurological: Negative.   Psychiatric/Behavioral: Negative.       Objective:   Physical Exam  Constitutional: She is oriented to person, place, and time. She appears well-developed and well-nourished.  HENT:  Head: Normocephalic and atraumatic.  Eyes: EOM are normal.  Neck: Normal range of motion.  Cardiovascular: Normal rate and regular rhythm.  Pulmonary/Chest: Effort normal and breath sounds normal. No respiratory distress. She has no wheezes. She has no rales.  Abdominal: Soft. Bowel sounds are normal. She exhibits no distension. There is no tenderness. There is no rebound.  Musculoskeletal: She exhibits no edema.  Neurological: She is alert and oriented to person, place, and time. Coordination normal.  Skin: Skin is warm and dry.  Psychiatric: She has a normal mood and affect.   Vitals:   06/17/18 0858  BP: 110/82  Pulse: 74  Temp: 97.9 F (36.6 C)  TempSrc: Oral  SpO2: 96%  Weight: 184 lb (83.5 kg)  Height: 5\' 6"  (1.676 m)      Assessment & Plan:

## 2018-06-17 NOTE — Assessment & Plan Note (Signed)
Rare ambien usage. Less than 30 pills per year. Reminded about risk of falls and memory change while taking. She agrees with rare usage.

## 2018-06-23 ENCOUNTER — Telehealth: Payer: Self-pay | Admitting: Internal Medicine

## 2018-06-23 NOTE — Telephone Encounter (Signed)
Copied from [place] [id number]. Topic: Quick Communication - Rx Refill/Question >> Jun 23, 2018 11:13 AM Reyne Dumas L wrote: Medication: Lorcaserin HCl ER (BELVIQ XR) 20 MG TB24  Pt has been told by the pharmacy that this medication was rejected due to insurance.  Pt states that the pharmacy told her that we needed to okay this medication.  Pt can be reached at [phone]  Preferred Pharmacy (with phone number or street name): [id number] - [place], [place]. AT [address] [phone] (Phone) [phone] (Fax)  Agent: Please be advised that RX refills may take up to 3 business days. We ask that you follow-up with your pharmacy.

## 2018-06-23 NOTE — Telephone Encounter (Signed)
Will do a PA on Wednesday

## 2018-06-24 NOTE — Telephone Encounter (Signed)
What is patient taking belviq for

## 2018-06-24 NOTE — Telephone Encounter (Signed)
Weight loss, obesity (BMI on chart)

## 2018-06-24 NOTE — Telephone Encounter (Signed)
PA started on CoverMyMeds KEY: [id number]

## 2018-06-25 NOTE — Telephone Encounter (Signed)
Called patient to inform her that the [place] is denied stating that it is one of the drugs excluded from her plan. Informed the patient that the fastest way to find out what or if anything is covered by them is to call her insurance and let us know, patient stated that she would just rather Dr. Sharlet Salina send something else in and try that route as it is really hard to get to anyone when calling her insurance. Please advise

## 2018-06-25 NOTE — Telephone Encounter (Signed)
Patient is calling to check on the status of this. I informed her that the PA has been started.

## 2018-06-26 NOTE — Telephone Encounter (Signed)
Unfortunately some insurance companies will not cover any weight loss medications and she should call her insurance company to see if any are covered or look through formulary if she has a copy of it (if she has a copy call back and we can give her names of other options).

## 2018-06-26 NOTE — Telephone Encounter (Signed)
LVM informing patient of the medications below

## 2018-06-26 NOTE — Telephone Encounter (Signed)
Patient Stated understanding to MD response. Patient is wondering if she could get a list of Weight loss medications just incase insurance does not cover any medications and that way she can look them up and have pharmacy let her no what the cost would be for those medications

## 2018-06-26 NOTE — Telephone Encounter (Signed)
Belviq, contrave, saxenda, qysmia

## 2019-03-24 ENCOUNTER — Ambulatory Visit: Payer: Medicare Other | Admitting: Allergy and Immunology

## 2019-03-24 ENCOUNTER — Other Ambulatory Visit: Payer: Self-pay

## 2019-03-24 ENCOUNTER — Ambulatory Visit
Admission: RE | Admit: 2019-03-24 | Discharge: 2019-03-24 | Disposition: A | Discharge status: Home / Self Care | Payer: Medicare Other | Source: Ambulatory Visit | Attending: Allergy and Immunology | Admitting: Allergy and Immunology

## 2019-03-24 ENCOUNTER — Encounter: Payer: Self-pay | Admitting: Allergy and Immunology

## 2019-03-24 VITALS — BP 118/78 | HR 75 | Temp 97.7°F | Resp 16 | Ht 64.5 in | Wt 191.4 lb

## 2019-03-24 DIAGNOSIS — J3089 Other allergic rhinitis: Secondary | ICD-10-CM

## 2019-03-24 DIAGNOSIS — R059 Cough, unspecified: Secondary | ICD-10-CM

## 2019-03-24 DIAGNOSIS — R05 Cough: Secondary | ICD-10-CM | POA: Diagnosis not present

## 2019-03-24 DIAGNOSIS — K219 Gastro-esophageal reflux disease without esophagitis: Secondary | ICD-10-CM | POA: Diagnosis not present

## 2019-03-24 MED ORDER — OMEPRAZOLE 40 MG PO CPDR: DELAYED_RELEASE_CAPSULE | ORAL | 5 refills | Status: DC

## 2019-03-24 MED ORDER — FAMOTIDINE 40 MG PO TABS: ORAL_TABLET | ORAL | 5 refills | Status: DC

## 2019-03-24 NOTE — Patient Instructions (Addendum)
  1.  Allergen avoidance measures?  2.  Continue to treat and prevent inflammation with the following:   A.  Singulair/montelukast 10 mg- 1 tablet daily  B.  Flonase 1-2 sprays each nostril 3-7 times a week  3.  Treat and prevent reflux:   A.  Consolidate alcohol and caffeine use  B.  Omeprazole 40 mg tablet in a.m.  C.  Famotidine 40 mg tablet in p.m.  4.  If needed:   A.  Claritin 10 mg tablet 1 time per day  5.  Obtain a chest x-ray  6.  Return to clinic in 4 weeks or earlier if problem

## 2019-03-24 NOTE — Progress Notes (Signed)
Heather Rivera - [place] - [place] - [place]   Dear Dr. Sharlet Salina,  Thank you for referring Heather Rivera to the [facility] [place] on 03/24/2019.   Below is a summation of this patient's evaluation and recommendations.  Thank you for your referral. I will keep you informed about this patient's response to treatment.   If you have any questions please do not hesitate to contact me.   Sincerely,  Jiles Prows, MD Allergy / Immunology [facility]   ______________________________________________________________________    NEW PATIENT NOTE  Referring Provider: Hoyt Koch, * Primary Provider: Hoyt Koch, MD Date of office visit: 03/24/2019    Subjective:   Chief Complaint:  Heather Rivera (DOB: Jan 30, 1951) is a 68 y.o. female who presents to the clinic on 03/24/2019 with a chief complaint of Cough and Throat clearing .     HPI: [name] presents to this clinic in evaluation of cough.  [name] describes a cough that has been present for about 2 years.  She feels as though this cough is "in her throat".  She has throat clearing and occasional raspy voice but no issues with swallowing although on a rare occasions she sometimes has the sensation that food takes a long time to pass through her chest..  Sometimes she gets spells of cough that are difficult to control.  Her cough is sporadic and intermittent and does not appear to disturb her sleep.  There is no associated gagging or retching or vomiting.  She does not have any associated shortness of breath or chest tightness or chest pain.  She does have sneezing and occasional nasal congestion itchy watery eyes occurring on a perennial basis with flare during the spring for which she utilizes Flonase and Claritin and Singulair which works well.  She does not have any associated anosmia or decreased ability to taste or  headaches.  She does not have any classic reflux symptoms.  She does drink 1 coffee per day and does not consume any tea or soda or chocolate and drinks about 2 glasses of wine per night.  Past Medical History:  Diagnosis Date  . ALLERGIC RHINITIS   . Female pattern baldness   . OBESITY     Past Surgical History:  Procedure Laterality Date  . [place]  . glass removed from finger  2010    Allergies as of 03/24/2019   No Known Allergies     Medication List    b complex vitamins capsule Take 1 capsule by mouth daily.   ELDERBERRY PO Take by mouth.   fluticasone 50 MCG/ACT nasal spray Commonly known as:  FLONASE Place 2 sprays into both nostrils daily.   glucosamine-chondroitin 500-400 MG tablet Take by mouth.   loratadine 10 MG tablet Commonly known as:  CLARITIN Take 10 mg by mouth daily.   MAGNESIUM PO Take by mouth.   montelukast 10 MG tablet Commonly known as:  SINGULAIR Take 1 tablet (10 mg total) by mouth at bedtime.   VITA-C PO Take by mouth.       Review of systems negative except as noted in HPI / PMHx or noted below:  Review of Systems  Constitutional: Negative.   HENT: Negative.   Eyes: Negative.   Respiratory: Negative.   Cardiovascular: Negative.   Gastrointestinal: Negative.   Genitourinary: Negative.   Musculoskeletal: Negative.   Skin: Negative.   Neurological: Negative.  Endo/Heme/Allergies: Negative.   Psychiatric/Behavioral: Negative.     Family History  Problem Relation Age of Onset  . Alcohol abuse Mother   . Arthritis Mother        grandparent  . COPD Mother   . Diabetes Mother   . Parkinson's disease Father   . Heart disease Brother   . Diabetes Maternal Grandmother     Social History   Socioeconomic History  . Marital status: Divorced    Spouse name: Not on file  . Number of children: 2  . Years of education: Not on file  . Highest education level: Not on file  Occupational History  . Not on  file  Social Needs  . Financial resource strain: Not hard at all  . Food insecurity:    Worry: Never true    Inability: Never true  . Transportation needs:    Medical: No    Non-medical: No  Tobacco Use  . Smoking status: Former Smoker    Last attempt to quit: 11/12/1978    Years since quitting: 40.3  . Smokeless tobacco: Never Used  . Tobacco comment: Divorced, single and lives alone. 2 grown kids. employed with hotel sales, [employer] and speaker presentation  Substance and Sexual Activity  . Alcohol use: Yes    Alcohol/week: 6.0 standard drinks    Types: 6 Glasses of wine per week    Comment: 6 glasses per week  . Drug use: No  . Sexual activity: Not Currently  Lifestyle  . Physical activity:    Days per week: 3 days    Minutes per session: 60 min  . Stress: Not at all  Relationships  . Social connections:    Talks on phone: More than three times a week    Gets together: More than three times a week    Attends religious service: Not on file    Active member of club or organization: Yes    Attends meetings of clubs or organizations: More than 4 times per year    Relationship status: Divorced  . Intimate partner violence:    Fear of current or ex partner: Not on file    Emotionally abused: Not on file    Physically abused: Not on file    Forced sexual activity: Not on file  Other Topics Concern  . Not on file  Social History Narrative  . Not on file    Environmental and Social history  Lives in a house with a dry environment, no animals located inside the household, no carpet in the bedroom, plastic on the bed, no plastic on the pillow, no smokers located to the household.  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   Objective:   Vitals:   03/24/19 0935  BP: 118/78  Pulse: 75  Resp: 16  Temp: 97.7 F (36.5 C)  SpO2: 95%   Height: 5' 4.5" (163.8 cm) Weight: 191 lb 6.4 oz (86.8 kg)  Physical Exam Constitutional:      Appearance: She is not  diaphoretic.     Comments: Throat clearing  HENT:     Head: Normocephalic. No right periorbital erythema or left periorbital erythema.     Right Ear: Tympanic membrane, ear canal and external ear normal.     Left Ear: Tympanic membrane, ear canal and external ear normal.     Nose: Nose normal. No mucosal edema or rhinorrhea.     Mouth/Throat:     Pharynx: No oropharyngeal exudate.  Eyes:     General: Lids are normal.     Conjunctiva/sclera: Conjunctivae normal.     Pupils: Pupils are equal, round, and reactive to light.  Neck:     Thyroid: No thyromegaly.     Trachea: Trachea normal. No tracheal deviation.  Cardiovascular:     Rate and Rhythm: Normal rate and regular rhythm.     Heart sounds: Normal heart sounds, S1 normal and S2 normal. No murmur.  Pulmonary:     Effort: Pulmonary effort is normal. No respiratory distress.     Breath sounds: No stridor. No wheezing or rales.  Chest:     Chest wall: No tenderness.  Abdominal:     General: There is no distension.     Palpations: Abdomen is soft. There is no mass.     Tenderness: There is no abdominal tenderness. There is no guarding or rebound.  Musculoskeletal:        General: No tenderness.  Lymphadenopathy:     Head:     Right side of head: No tonsillar adenopathy.     Left side of head: No tonsillar adenopathy.     Cervical: No cervical adenopathy.  Skin:    Coloration: Skin is not pale.     Findings: No erythema or rash.     Nails: There is no clubbing.   Neurological:     Mental Status: She is alert.     Diagnostics: Allergy skin tests were not performed secondary to the recent administration of a antihistamine.   Spirometry was performed and demonstrated an FEV1 of 2.33 @ 97 % of predicted. FEV1/FVC = 0.82  Assessment and Plan:    1. Perennial allergic rhinitis   2. LPRD (laryngopharyngeal reflux disease)   3. Cough     1.  Allergen avoidance measures?  2.  Continue to treat and prevent inflammation  with the following:   A.  Singulair/montelukast 10 mg- 1 tablet daily  B.  Flonase 1-2 sprays each nostril 3-7 times a week  3.  Treat and prevent reflux:   A.  Consolidate alcohol and caffeine use  B.  Omeprazole 40 mg tablet in a.m.  C.  Famotidine 40 mg tablet in p.m.  4.  If needed:   A.  Claritin 10 mg tablet 1 time per day  5.  Obtain a chest x-ray  6.  Return to clinic in 4 weeks or earlier if problem  Although [name] may have a component of atopic disease that hopefully is adequately addressed with the anti-inflammatory agents for her airway as noted above, I think that there is a component of reflux induced respiratory disease in the form of LPR contributing to her throat issues and cough.  We will treat this condition with the therapy noted above.  I will obtain a chest x-ray to screen for a worrisome mediastinal or lung parenchymal abnormality.  I will see her back in this clinic in 4 weeks or earlier if there is a problem.  Jiles Prows, MD Allergy / Immunology [facility] [place]

## 2019-03-25 ENCOUNTER — Encounter: Payer: Self-pay | Admitting: Allergy and Immunology

## 2019-04-28 ENCOUNTER — Encounter: Payer: Self-pay | Admitting: Allergy and Immunology

## 2019-04-28 ENCOUNTER — Other Ambulatory Visit: Payer: Self-pay

## 2019-04-28 ENCOUNTER — Ambulatory Visit: Payer: Medicare Other | Admitting: Allergy and Immunology

## 2019-04-28 VITALS — BP 138/82 | HR 78 | Temp 97.6°F | Resp 16 | Ht 65.5 in | Wt 192.8 lb

## 2019-04-28 DIAGNOSIS — K219 Gastro-esophageal reflux disease without esophagitis: Secondary | ICD-10-CM

## 2019-04-28 DIAGNOSIS — J3089 Other allergic rhinitis: Secondary | ICD-10-CM

## 2019-04-28 IMAGING — DX DG SHOULDER 2+V*R*
3 series · 3 of 3 positions shown · non-contrast
Comparison: None.

CLINICAL DATA: Right shoulder pain for 8 months.

EXAM:
RIGHT SHOULDER - 2+ VIEW

[grashey]
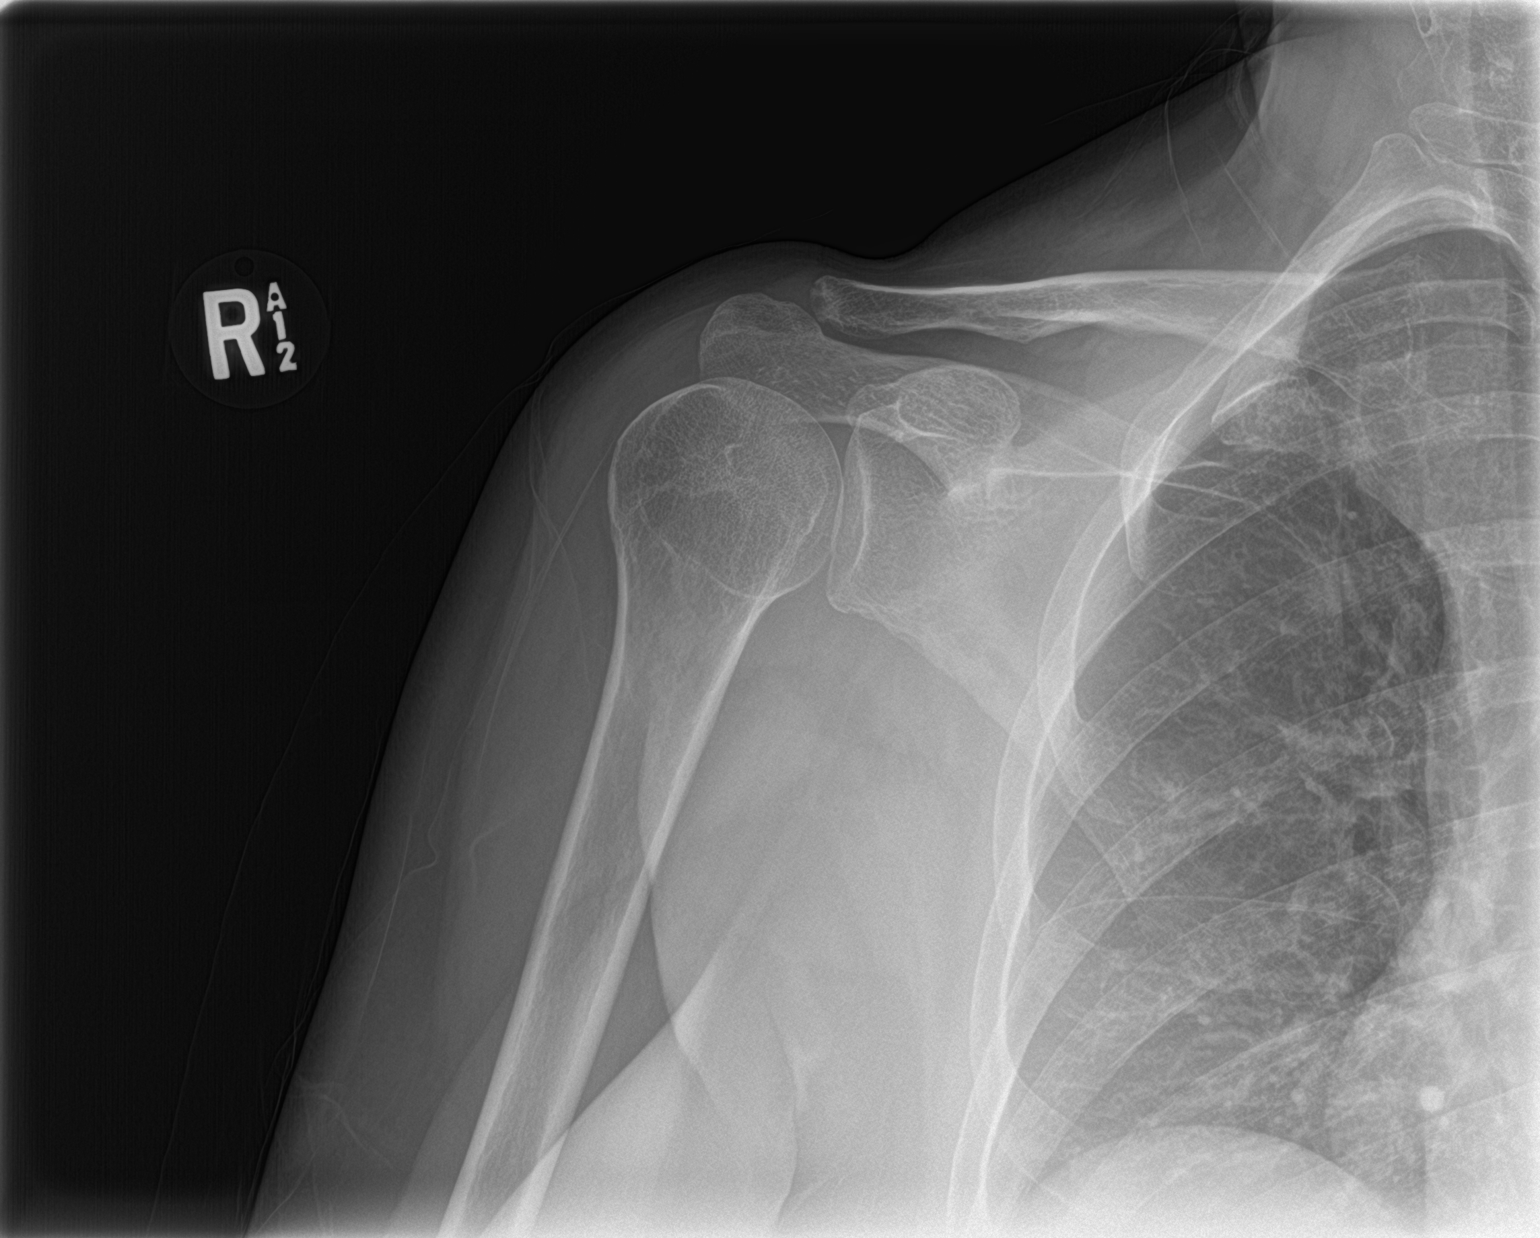

[y view]
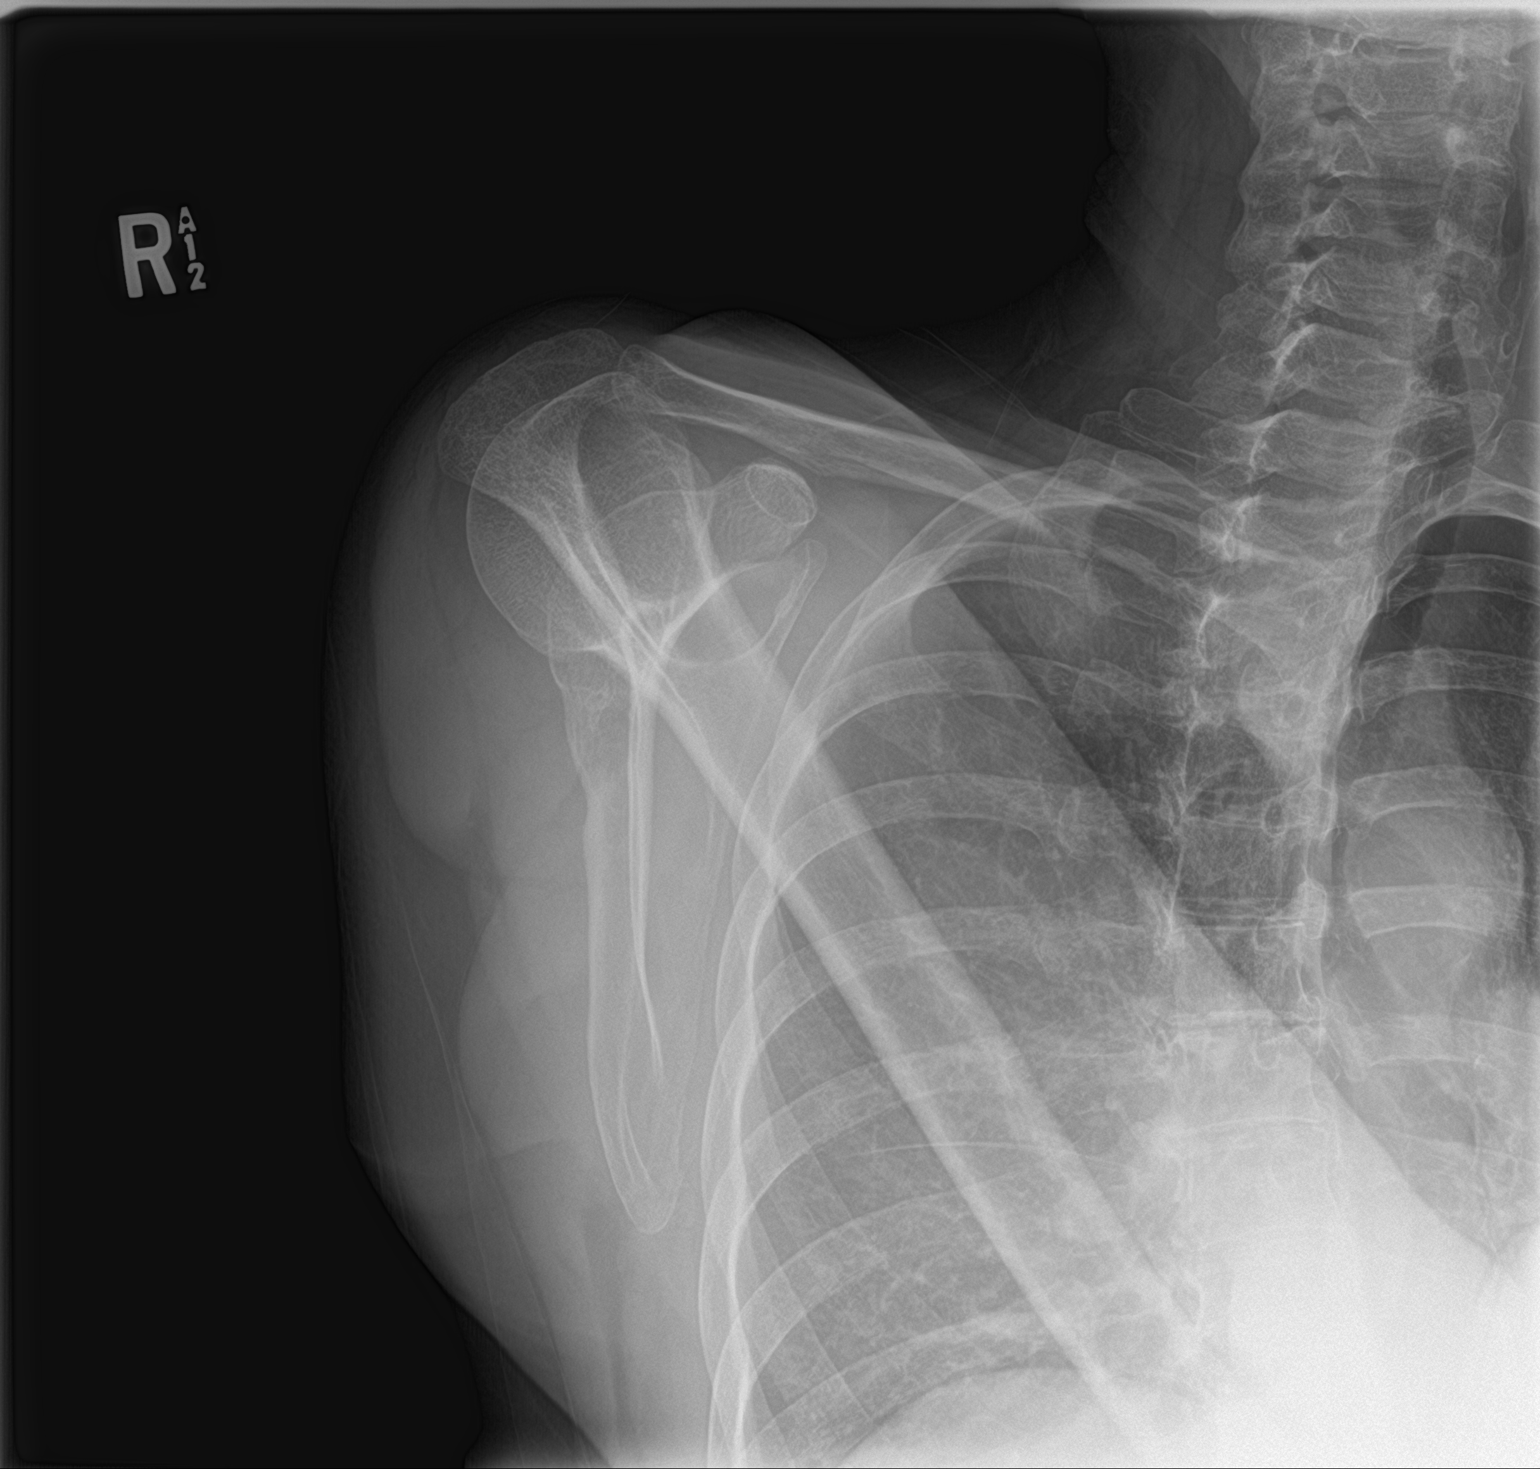

[shoulder axial]
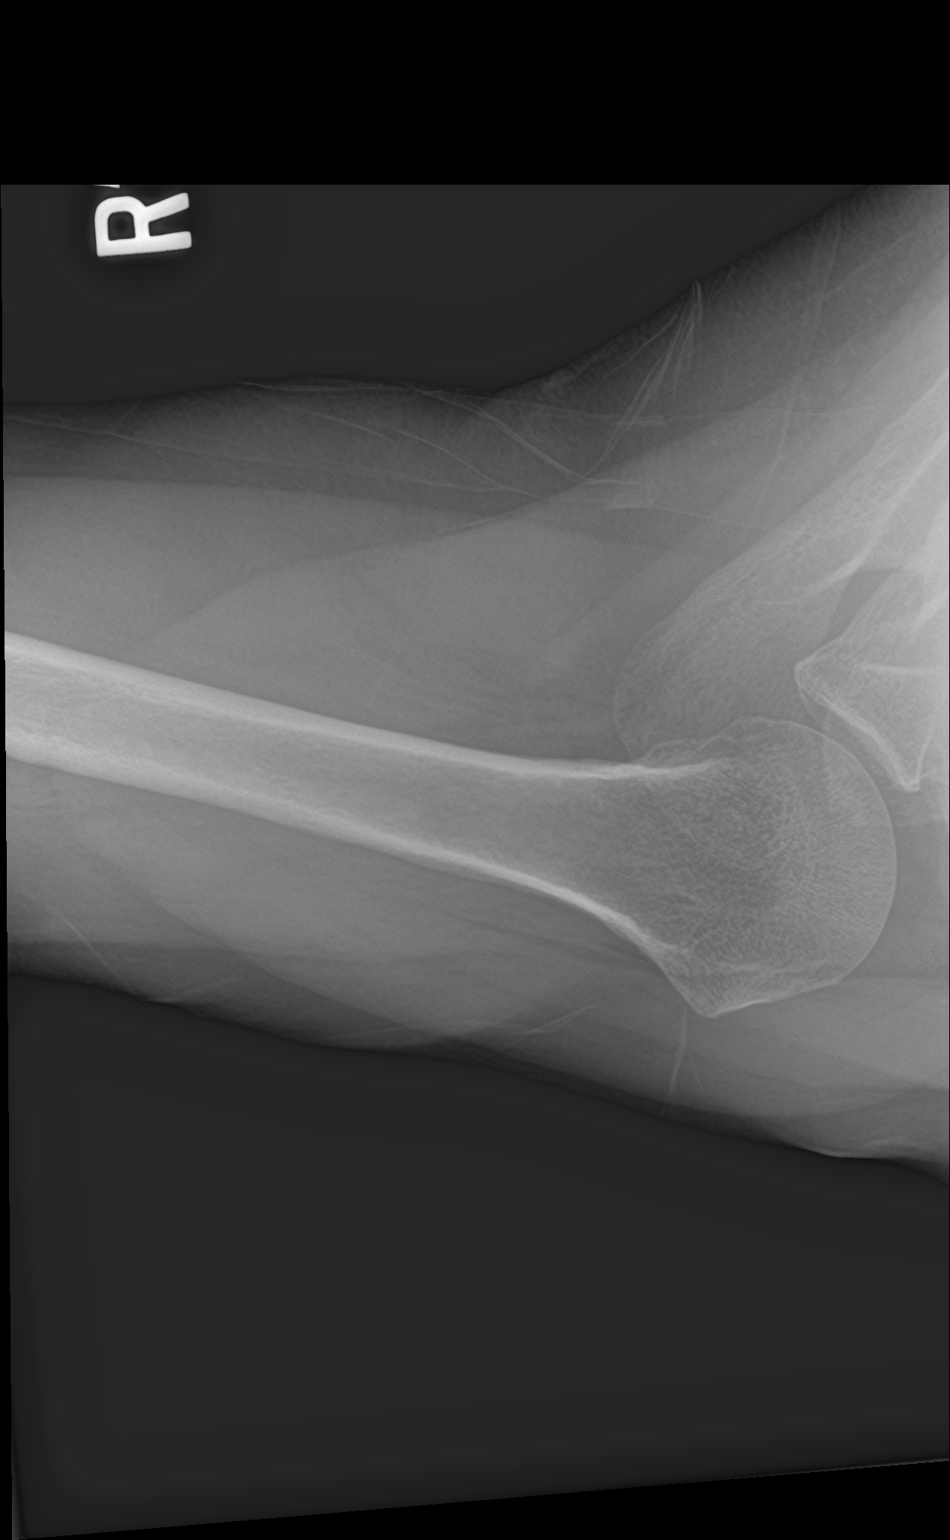

[3 of 3 positions shown; findings below may reference images not displayed]

FINDINGS: There is no evidence of fracture or dislocation. There is no
evidence of arthropathy or other focal bone abnormality. Soft
tissues are unremarkable.
IMPRESSION: Negative.

## 2019-04-28 NOTE — Progress Notes (Signed)
[place]   Follow-up Note  Referring Provider: Hoyt Koch, * Primary Provider: Hoyt Koch, MD Date of Office Visit: 04/28/2019  Subjective:   Heather Rivera (DOB: [birth year]) is a 68 y.o. female who returns to the [facility] [place] on 04/28/2019 in re-evaluation of the following:  HPI: Heather Rivera returns to this clinic in evaluation of cough and rhinitis.  Her last visit to this clinic was her initial evaluation of 24 Mar 2019.  She has resolved her cough of 2 years duration.  She has no issues with her throat and has no issues with her nose.  She has consolidated her alcohol use by 50% and is drinking 1 cup of coffee per day.  She continues on montelukast and Flonase and omeprazole and famotidine.  Allergies as of 04/28/2019   No Known Allergies     Medication List      b complex vitamins capsule Take 1 capsule by mouth daily.   ELDERBERRY PO Take by mouth.   famotidine 40 MG tablet Commonly known as: PEPCID Take 1 tablet by mouth once daily in the evening   fluticasone 50 MCG/ACT nasal spray Commonly known as: FLONASE Place 2 sprays into both nostrils daily.   glucosamine-chondroitin 500-400 MG tablet Take by mouth.   loratadine 10 MG tablet Commonly known as: CLARITIN Take 10 mg by mouth daily.   MAGNESIUM PO Take by mouth.   montelukast 10 MG tablet Commonly known as: SINGULAIR Take 1 tablet (10 mg total) by mouth at bedtime.   omeprazole 40 MG capsule Commonly known as: PRILOSEC Take 1 capsule by mouth daily in the morning   VITA-C PO Take by mouth.       Past Medical History:  Diagnosis Date  . ALLERGIC RHINITIS   . Female pattern baldness   . OBESITY     Past Surgical History:  Procedure Laterality Date  . [place]  . glass removed from finger  2010    Review of systems negative except as noted in HPI / PMHx or noted below:  Review of Systems   Constitutional: Negative.   HENT: Negative.   Eyes: Negative.   Respiratory: Negative.   Cardiovascular: Negative.   Gastrointestinal: Negative.   Genitourinary: Negative.   Musculoskeletal: Negative.   Skin: Negative.   Neurological: Negative.   Endo/Heme/Allergies: Negative.   Psychiatric/Behavioral: Negative.      Objective:   Vitals:   04/28/19 1642  BP: 138/82  Pulse: 78  Resp: 16  Temp: 97.6 F (36.4 C)  SpO2: 96%   Height: 5' 5.5" (166.4 cm)  Weight: 192 lb 12.8 oz (87.5 kg)   Physical Exam Constitutional:      Appearance: She is not diaphoretic.  HENT:     Head: Normocephalic.     Right Ear: Tympanic membrane, ear canal and external ear normal.     Left Ear: Tympanic membrane, ear canal and external ear normal.     Nose: Nose normal. No mucosal edema or rhinorrhea.     Mouth/Throat:     Pharynx: Uvula midline. No oropharyngeal exudate.  Eyes:     Conjunctiva/sclera: Conjunctivae normal.  Neck:     Thyroid: No thyromegaly.     Trachea: Trachea normal. No tracheal tenderness or tracheal deviation.  Cardiovascular:     Rate and Rhythm: Normal rate and regular rhythm.     Heart sounds: Normal heart sounds, S1 normal and S2 normal.  No murmur.  Pulmonary:     Effort: No respiratory distress.     Breath sounds: Normal breath sounds. No stridor. No wheezing or rales.  Lymphadenopathy:     Head:     Right side of head: No tonsillar adenopathy.     Left side of head: No tonsillar adenopathy.     Cervical: No cervical adenopathy.  Skin:    Findings: No erythema or rash.     Nails: There is no clubbing.   Neurological:     Mental Status: She is alert.     Diagnostics:   Results of a chest x-ray obtained 24 Mar 2019 identified the following:  The heart size and mediastinal contours are within normal limits. Both lungs are clear. The visualized skeletal structures are unremarkable.  Assessment and Plan:   1. Perennial allergic rhinitis   2. LPRD  (laryngopharyngeal reflux disease)     1.  Continue to treat and prevent inflammation with the following:   A.  Singulair/montelukast 10 mg- 1 tablet daily  B.  Flonase 1-2 sprays each nostril 3-7 times a week  2.  Treat and prevent reflux:   A.  Consolidate alcohol and caffeine use  B.  Omeprazole 40 mg tablet in a.m.  C.  Famotidine 40 mg tablet in p.m.  4.  If needed:   A.  Claritin 10 mg tablet 1 time per day  5. Return to clinic in 8 weeks or earlier if problem. Taper?  Heather Rivera has had an excellent initial response to medical therapy directed against respiratory tract inflammation and reflux.  I would like for her to remain on this plan for a full 12 weeks prior to altering the dosage of her medications.  Thus, I will see her back in this clinic in 8 weeks.  If she does well we will attempt to taper some of her medications  Allena Katz, MD Allergy / [place]

## 2019-04-28 NOTE — Patient Instructions (Addendum)
  1.  Continue to treat and prevent inflammation with the following:   A.  Singulair/montelukast 10 mg- 1 tablet daily  B.  Flonase 1-2 sprays each nostril 3-7 times a week  2.  Treat and prevent reflux:   A.  Consolidate alcohol and caffeine use  B.  Omeprazole 40 mg tablet in a.m.  C.  Famotidine 40 mg tablet in p.m.  4.  If needed:   A.  Claritin 10 mg tablet 1 time per day  5. Return to clinic in 8 weeks or earlier if problem. Taper?

## 2019-04-29 ENCOUNTER — Encounter: Payer: Self-pay | Admitting: Allergy and Immunology

## 2019-06-06 ENCOUNTER — Other Ambulatory Visit: Payer: Self-pay | Admitting: Internal Medicine

## 2019-06-10 ENCOUNTER — Encounter: Payer: Self-pay | Admitting: Internal Medicine

## 2019-07-07 ENCOUNTER — Telehealth: Payer: Self-pay | Admitting: *Deleted

## 2019-07-07 ENCOUNTER — Ambulatory Visit: Payer: Medicare Other | Admitting: Allergy and Immunology

## 2019-07-07 NOTE — Telephone Encounter (Signed)
LVM for patient to call back in regards to scheduling AWV with our health coach. Discussed doing a telephone visit and asked patient to call nurse back at [phone] to schedule.   

## 2019-07-14 ENCOUNTER — Other Ambulatory Visit: Payer: Self-pay | Admitting: Allergy and Immunology

## 2019-08-04 ENCOUNTER — Other Ambulatory Visit: Payer: Self-pay

## 2019-08-04 ENCOUNTER — Encounter: Payer: Self-pay | Admitting: Allergy and Immunology

## 2019-08-04 ENCOUNTER — Ambulatory Visit: Payer: Medicare Other | Admitting: Allergy and Immunology

## 2019-08-04 VITALS — BP 122/72 | HR 92 | Temp 97.6°F | Resp 16

## 2019-08-04 DIAGNOSIS — J3089 Other allergic rhinitis: Secondary | ICD-10-CM

## 2019-08-04 DIAGNOSIS — K219 Gastro-esophageal reflux disease without esophagitis: Secondary | ICD-10-CM | POA: Diagnosis not present

## 2019-08-04 NOTE — Progress Notes (Signed)
[place]   Follow-up Note  Referring Provider: Hoyt Koch, * Primary Provider: Hoyt Koch, MD Date of Office Visit: 08/04/2019  Subjective:   Heather Rivera (DOB: 1951/01/04) is a 68 y.o. female who returns to the [facility] and [place] on 08/04/2019 in re-evaluation of the following:  HPI: Heather Rivera returns to this clinic in evaluation of rhinitis and cough believed secondary to LPR.  Her last visit to this clinic was 28 April 2019.  Heather Rivera has really done very well and she rarely has any cough and rarely has any regurgitation and rarely has any issues with her nose while consistently using a combination of therapy directed against respiratory tract inflammation and reflux.  Her use of alcohol is about 1 or 2 drinks of wine per night and she drinks 1 cup of coffee per day.  Allergies as of 08/04/2019   No Known Allergies     Medication List      b complex vitamins capsule Take 1 capsule by mouth daily.   ELDERBERRY PO Take by mouth.   famotidine 40 MG tablet Commonly known as: PEPCID Take 1 tablet by mouth once daily in the evening   fluticasone 50 MCG/ACT nasal spray Commonly known as: FLONASE Place 2 sprays into both nostrils daily.   glucosamine-chondroitin 500-400 MG tablet Take by mouth.   loratadine 10 MG tablet Commonly known as: CLARITIN Take 10 mg by mouth daily.   MAGNESIUM PO Take by mouth.   montelukast 10 MG tablet Commonly known as: SINGULAIR TAKE 1 TABLET BY MOUTH EVERYDAY AT BEDTIME   omeprazole 40 MG capsule Commonly known as: PRILOSEC TAKE 1 CAPSULE BY MOUTH EVERY MORNING   VITA-C PO Take by mouth.       Past Medical History:  Diagnosis Date  . ALLERGIC RHINITIS   . Female pattern baldness   . OBESITY     Past Surgical History:  Procedure Laterality Date  . [place]  . glass removed from finger  2010    Review of systems negative except  as noted in HPI / PMHx or noted below:  Review of Systems  Constitutional: Negative.   HENT: Negative.   Eyes: Negative.   Respiratory: Negative.   Cardiovascular: Negative.   Gastrointestinal: Negative.   Genitourinary: Negative.   Musculoskeletal: Negative.   Skin: Negative.   Neurological: Negative.   Endo/Heme/Allergies: Negative.   Psychiatric/Behavioral: Negative.      Objective:   Vitals:   08/04/19 1525  BP: 122/72  Pulse: 92  Resp: 16  Temp: 97.6 F (36.4 C)  SpO2: 97%          Physical Exam Constitutional:      Appearance: She is not diaphoretic.  HENT:     Head: Normocephalic.     Right Ear: Tympanic membrane, ear canal and external ear normal.     Left Ear: Tympanic membrane, ear canal and external ear normal.     Nose: Nose normal. No mucosal edema or rhinorrhea.     Mouth/Throat:     Pharynx: Uvula midline. No oropharyngeal exudate.  Eyes:     Conjunctiva/sclera: Conjunctivae normal.  Neck:     Thyroid: No thyromegaly.     Trachea: Trachea normal. No tracheal tenderness or tracheal deviation.  Cardiovascular:     Rate and Rhythm: Normal rate and regular rhythm.     Heart sounds: Normal heart sounds, S1 normal and S2 normal. No murmur.  Pulmonary:     Effort: No respiratory distress.     Breath sounds: Normal breath sounds. No stridor. No wheezing or rales.  Lymphadenopathy:     Head:     Right side of head: No tonsillar adenopathy.     Left side of head: No tonsillar adenopathy.     Cervical: No cervical adenopathy.  Skin:    Findings: No erythema or rash.     Nails: There is no clubbing.   Neurological:     Mental Status: She is alert.     Diagnostics: none  Assessment and Plan:   1. Perennial allergic rhinitis   2. LPRD (laryngopharyngeal reflux disease)     1.  Continue to treat and prevent inflammation with the following:   A.  Flonase 1-2 sprays each nostril 3-7 times a week  B.  Can attempt to discontinue montelukast 10  mg tablet daily  2.  Treat and prevent reflux:   A.  Consolidate alcohol and caffeine use  B.  Omeprazole 40 mg tablet in a.m.  C.  Can attempt to discontinue famotidine 40 mg tablet in p.m.  4.  If needed:   A.  Claritin 10 mg tablet 1 time per day  5. Return to clinic in 6 months or earlier if problem  6. Obtain fall flu vaccine (and COVID vaccine)  Heather Rivera really appears to be doing very well and there may be an opportunity to consolidate her treatment and were going to give her the option of discontinuing her montelukast and discontinuing famotidine while she continues on Flonase and omeprazole and pays attention to alcohol and caffeine use.  Assuming she does well I will see her back in this clinic in 6 months or earlier if there is a problem.  Allena Katz, MD Allergy / Immunology [place]

## 2019-08-04 NOTE — Patient Instructions (Addendum)
  1.  Continue to treat and prevent inflammation with the following:   A.  Flonase 1-2 sprays each nostril 3-7 times a week  B.  Can attempt to discontinue montelukast 10 mg tablet daily  2.  Treat and prevent reflux:   A.  Consolidate alcohol and caffeine use  B.  Omeprazole 40 mg tablet in a.m.  C.  Can attempt to discontinue famotidine 40 mg tablet in p.m.  4.  If needed:   A.  Claritin 10 mg tablet 1 time per day  5. Return to clinic in 6 months or earlier if problem  6. Obtain fall flu vaccine (and COVID vaccine)

## 2019-08-05 ENCOUNTER — Encounter: Payer: Self-pay | Admitting: Allergy and Immunology

## 2019-08-15 ENCOUNTER — Other Ambulatory Visit: Payer: Self-pay | Admitting: Allergy and Immunology

## 2019-09-05 ENCOUNTER — Other Ambulatory Visit: Payer: Self-pay | Admitting: Internal Medicine

## 2019-10-03 ENCOUNTER — Other Ambulatory Visit: Payer: Self-pay | Admitting: Internal Medicine

## 2019-10-07 ENCOUNTER — Ambulatory Visit: Payer: Medicare Other | Admitting: Internal Medicine

## 2019-10-12 ENCOUNTER — Ambulatory Visit (INDEPENDENT_AMBULATORY_CARE_PROVIDER_SITE_OTHER): Payer: Medicare Other | Admitting: Internal Medicine

## 2019-10-12 ENCOUNTER — Encounter: Payer: Self-pay | Admitting: Internal Medicine

## 2019-10-12 DIAGNOSIS — J3089 Other allergic rhinitis: Secondary | ICD-10-CM | POA: Diagnosis not present

## 2019-10-12 MED ORDER — PHENTERMINE HCL 37.5 MG PO CAPS: 37.5000 mg | ORAL_CAPSULE | ORAL | 0 refills | Status: DC

## 2019-10-12 MED ORDER — MONTELUKAST SODIUM 10 MG PO TABS: 10.0000 mg | ORAL_TABLET | Freq: Every day | ORAL | 3 refills | Status: DC

## 2019-10-12 NOTE — Progress Notes (Signed)
Virtual Visit via Video Note  I connected with Heather Rivera on 10/12/19 at 10:00 AM EST by a video enabled telemedicine application and verified that I am speaking with the correct person using two identifiers.  The patient and the provider were at separate locations throughout the entire encounter.   I discussed the limitations of evaluation and management by telemedicine and the availability of in person appointments. The patient expressed understanding and agreed to proceed. The patient and the provider were the only parties present for the visit unless noted in HPI below.  History of Present Illness: The patient is a 68 y.o. female with visit for chronic cough due to allergies. She is taking singulair and this helps well. She is needing refill and denies cough while taking medication. Denies SOB or fevers or chills. Due to covid-19 she is eating more. Has taken appetite suppressant in the past and wants to try this for a couple of weeks to help her break these habits. Started months ago and is concerned about weight gain.   Observations/Objective: Appearance: normal, breathing appears normal, work grooming, abdomen does not appear distended, throat normal, memory normal, mental status is A and O times 3  Assessment and Plan: See problem oriented charting  Follow Up Instructions: refill singulair and rx phentermine to take for 1 month maximum  I discussed the assessment and treatment plan with the patient. The patient was provided an opportunity to ask questions and all were answered. The patient agreed with the plan and demonstrated an understanding of the instructions.   The patient was advised to call back or seek an in-person evaluation if the symptoms worsen or if the condition fails to improve as anticipated.  Hoyt Koch, MD

## 2019-10-12 NOTE — Assessment & Plan Note (Signed)
Refill singulair 

## 2019-11-26 ENCOUNTER — Other Ambulatory Visit: Payer: Self-pay | Admitting: Internal Medicine

## 2019-11-26 NOTE — Telephone Encounter (Signed)
Filled 10/12/2019 Phentermine 37.5 Mg Capsule 30#  Last ov 10/12/19 Next ov n/s

## 2019-12-16 ENCOUNTER — Telehealth: Payer: Self-pay | Admitting: Internal Medicine

## 2019-12-16 NOTE — Telephone Encounter (Signed)
   Patient would like to know if she should get the Covid vaccine this weekend, patient had shingles shot almost 2 weeks ago.  Please advise

## 2019-12-16 NOTE — Telephone Encounter (Signed)
Called pt, LVM stating ok to proceed with vaccine.

## 2020-01-11 ENCOUNTER — Other Ambulatory Visit: Payer: Self-pay | Admitting: Allergy and Immunology

## 2020-02-02 DIAGNOSIS — H524 Presbyopia: Secondary | ICD-10-CM | POA: Diagnosis not present

## 2020-02-02 DIAGNOSIS — H2513 Age-related nuclear cataract, bilateral: Secondary | ICD-10-CM | POA: Diagnosis not present

## 2020-02-02 DIAGNOSIS — H5213 Myopia, bilateral: Secondary | ICD-10-CM | POA: Diagnosis not present

## 2020-02-28 ENCOUNTER — Other Ambulatory Visit: Payer: Self-pay | Admitting: Allergy and Immunology

## 2020-04-15 ENCOUNTER — Other Ambulatory Visit: Payer: Self-pay | Admitting: Allergy and Immunology

## 2020-05-09 ENCOUNTER — Other Ambulatory Visit: Payer: Self-pay | Admitting: Allergy and Immunology

## 2020-05-26 ENCOUNTER — Other Ambulatory Visit: Payer: Self-pay | Admitting: Allergy and Immunology

## 2020-07-12 ENCOUNTER — Other Ambulatory Visit: Payer: Medicare Other

## 2020-07-12 ENCOUNTER — Other Ambulatory Visit: Payer: Self-pay

## 2020-07-12 DIAGNOSIS — Z20822 Contact with and (suspected) exposure to covid-19: Secondary | ICD-10-CM

## 2020-07-13 LAB — NOVEL CORONAVIRUS, NAA: SARS-CoV-2, NAA: NOT DETECTED

## 2020-08-17 ENCOUNTER — Other Ambulatory Visit: Payer: Self-pay

## 2020-08-17 ENCOUNTER — Encounter: Payer: Self-pay | Admitting: Internal Medicine

## 2020-08-17 ENCOUNTER — Ambulatory Visit (INDEPENDENT_AMBULATORY_CARE_PROVIDER_SITE_OTHER): Payer: Medicare Other | Admitting: Internal Medicine

## 2020-08-17 DIAGNOSIS — B354 Tinea corporis: Secondary | ICD-10-CM

## 2020-08-17 DIAGNOSIS — L309 Dermatitis, unspecified: Secondary | ICD-10-CM | POA: Diagnosis not present

## 2020-08-17 DIAGNOSIS — B351 Tinea unguium: Secondary | ICD-10-CM

## 2020-08-17 LAB — HEPATIC FUNCTION PANEL
ALT: 55 U/L — ABNORMAL HIGH (ref 0–35)
AST: 27 U/L (ref 0–37)
Albumin: 4.2 g/dL (ref 3.5–5.2)
Alkaline Phosphatase: 59 U/L (ref 39–117)
Bilirubin, Direct: 0.1 mg/dL (ref 0.0–0.3)
Total Bilirubin: 0.6 mg/dL (ref 0.2–1.2)
Total Protein: 7.2 g/dL (ref 6.0–8.3)

## 2020-08-17 MED ORDER — TERBINAFINE HCL 250 MG PO TABS: 250.0000 mg | ORAL_TABLET | Freq: Every day | ORAL | 0 refills | Status: DC

## 2020-08-17 MED ORDER — KETOCONAZOLE 2 % EX CREA: 1.0000 "application " | TOPICAL_CREAM | Freq: Every day | CUTANEOUS | 0 refills | Status: DC

## 2020-08-17 MED ORDER — HYDROCORTISONE 2.5 % EX CREA: TOPICAL_CREAM | Freq: Two times a day (BID) | CUTANEOUS | 0 refills | Status: DC

## 2020-08-17 NOTE — Patient Instructions (Addendum)
  Blood work was ordered.     Medications reviewed and updated.  Changes include :   Ketoconazole cream for the rash under your breasts.  Hydrocortisone cream for your rash on your ankle.  lamisil orally for your toenail infection.     Your prescription(s) have been submitted to your pharmacy. Please take as directed and contact our office if you believe you are having problem(s) with the medication(s).    Blood work again in 2-3 weeks at the [facility].

## 2020-08-17 NOTE — Assessment & Plan Note (Signed)
Acute Rash under breast suggestive of Expired ketoconazole cream did seem to help and that is the appropriate treatment-did not use cortisone We will give a new prescription of ketoconazole cream and she will apply twice daily Advised her to keep the area dry

## 2020-08-17 NOTE — Assessment & Plan Note (Signed)
Acute Rash posterior right ankle/Achilles looks like it could be eczema Trial of hydrocortisone 2.5%

## 2020-08-17 NOTE — Assessment & Plan Note (Signed)
Chronic problem Toenails She has tried everything topically and if anything has worked it comes back She is interested in having a more permanent solution Discussed oral medication and concerns with affecting the liver-she is interested in trying this Check hepatic function today Start Lamisil 250 mg daily for 12 weeks Recheck hepatic function in 2-3 weeks

## 2020-08-17 NOTE — Progress Notes (Signed)
Subjective:    Patient ID: Heather Rivera, female    DOB: February 06, 1951, 69 y.o.   MRN: [id number]  HPI The patient is here for an acute visit.   rash posterior R ankle - it has been there for about 3 weeks.  It is red and dry/scaly, but not itchy.  Ketoconazole cream did not help.  Cortisone cream helped.  She denies a similar rash elsewhere.   Rash under left breast-she first noticed the rash because she had an odor and then saw the rash, which is itchy and red.  She states it did sting a little when she put cream on it.  She tried the hydrocortisone and the ketoconazole, which is expired.  She thinks it is a little bit better.  She is unsure what to put on it.    Toenail fungus: She has had this for at least 15 years.  She has tried several things topically and some things have helped but it always comes back.  She is very discouraged.  She is interested in other treatment options. fugus on toe x 15 years- keeps coming back.     Medications and allergies reviewed with patient and updated if appropriate.  Patient Active Problem List   Diagnosis Date Noted  . Onychomycosis 08/17/2020  . Insomnia 06/17/2018  . Metatarsalgia of both feet 07/05/2017  . Rotator cuff syndrome of right shoulder 05/24/2017  . Routine general medical examination at a health care facility 02/10/2015  . Allergic rhinitis 10/23/2010    Current Outpatient Medications on File Prior to Visit  Medication Sig Dispense Refill  . Ascorbic Acid (VITA-C PO) Take by mouth.    Marland Kitchen b complex vitamins capsule Take 1 capsule by mouth daily.      . famotidine (PEPCID) 40 MG tablet TAKE 1 TABLET BY MOUTH EVERY EVENING 90 tablet 0  . fluticasone (FLONASE) 50 MCG/ACT nasal spray Place 2 sprays into both nostrils daily. 16 g 6  . loratadine (CLARITIN) 10 MG tablet Take 10 mg by mouth daily.    Marland Kitchen MAGNESIUM PO Take by mouth.    . montelukast (SINGULAIR) 10 MG tablet Take 1 tablet (10 mg total) by mouth at bedtime. 90 tablet 3    . phentermine 37.5 MG capsule Take 1 capsule (37.5 mg total) by mouth every morning. 30 capsule 0  . omeprazole (PRILOSEC) 40 MG capsule TAKE 1 CAPSULE BY MOUTH EVERY DAY IN THE MORNING (Patient not taking: Reported on 08/17/2020) 30 capsule 2   No current facility-administered medications on file prior to visit.    Past Medical History:  Diagnosis Date  . ALLERGIC RHINITIS   . Female pattern baldness   . OBESITY     Past Surgical History:  Procedure Laterality Date  . [place]  . glass removed from finger  2010    Social History   Socioeconomic History  . Marital status: Divorced    Spouse name: Not on file  . Number of children: 2  . Years of education: Not on file  . Highest education level: Not on file  Occupational History  . Not on file  Tobacco Use  . Smoking status: Former Smoker    Quit date: 11/12/1978    Years since quitting: 41.7  . Smokeless tobacco: Never Used  . Tobacco comment: Divorced, single and lives alone. 2 grown kids. employed with hotel sales, [employer] and speaker presentation  Vaping Use  . Vaping Use: Never used  Substance and  Sexual Activity  . Alcohol use: Yes    Alcohol/week: 6.0 standard drinks    Types: 6 Glasses of wine per week    Comment: 6 glasses per week  . Drug use: No  . Sexual activity: Not Currently  Other Topics Concern  . Not on file  Social History Narrative  . Not on file   Social Determinants of Health   Financial Resource Strain:   . Difficulty of Paying Living Expenses: Not on file  Food Insecurity:   . Worried About Charity fundraiser in the Last Year: Not on file  . Ran Out of Food in the Last Year: Not on file  Transportation Needs:   . Lack of Transportation (Medical): Not on file  . Lack of Transportation (Non-Medical): Not on file  Physical Activity:   . Days of Exercise per Week: Not on file  . Minutes of Exercise per Session: Not on file  Stress:   . Feeling of Stress : Not on file   Social Connections:   . Frequency of Communication with Friends and Family: Not on file  . Frequency of Social Gatherings with Friends and Family: Not on file  . Attends Religious Services: Not on file  . Active Member of Clubs or Organizations: Not on file  . Attends Archivist Meetings: Not on file  . Marital Status: Not on file    Family History  Problem Relation Age of Onset  . Alcohol abuse Mother   . Arthritis Mother        grandparent  . COPD Mother   . Diabetes Mother   . Parkinson's disease Father   . Heart disease Brother   . Diabetes Maternal Grandmother     Review of Systems  Constitutional: Negative for fever.  Skin: Positive for rash. Negative for wound.       Objective:   Vitals:   08/17/20 1349  BP: 124/72  Pulse: 72  Temp: 98.1 F (36.7 C)  SpO2: 97%   BP Readings from Last 3 Encounters:  08/17/20 124/72  08/04/19 122/72  04/28/19 138/82   Wt Readings from Last 3 Encounters:  08/17/20 187 lb (84.8 kg)  04/28/19 192 lb 12.8 oz (87.5 kg)  03/24/19 191 lb 6.4 oz (86.8 kg)   Body mass index is 30.65 kg/m.   Physical Exam Constitutional:      General: She is not in acute distress.    Appearance: Normal appearance. She is not ill-appearing.  Skin:    Findings: Rash (Posterior right ankle along the helix dry, erythematous rash with breaks in the skin, nontender, no blisters or open wounds) present.     Comments: Rash under breasts not evaluated.  Toenail fungus with discoloration, mild thickening  Neurological:     Mental Status: She is alert.            Assessment & Plan:    See Problem List for Assessment and Plan of chronic medical problems.    This visit occurred during the SARS-CoV-2 public health emergency.  Safety protocols were in place, including screening questions prior to the visit, additional usage of staff PPE, and extensive cleaning of exam room while observing appropriate contact time as indicated for  disinfecting solutions.

## 2020-08-26 ENCOUNTER — Other Ambulatory Visit: Payer: Self-pay | Admitting: Allergy and Immunology

## 2020-08-29 ENCOUNTER — Telehealth: Payer: Self-pay | Admitting: Internal Medicine

## 2020-08-29 MED ORDER — FLUTICASONE PROPIONATE 50 MCG/ACT NA SUSP: 2.0000 | Freq: Every day | NASAL | 6 refills | Status: AC

## 2020-08-29 MED ORDER — MONTELUKAST SODIUM 10 MG PO TABS
10.0000 mg | ORAL_TABLET | Freq: Every day | ORAL | 0 refills | Status: DC
Start: 2020-08-29 — End: 2020-10-27

## 2020-08-29 NOTE — Addendum Note (Signed)
Addended by: Hinda Kehr on: 08/29/2020 01:35 PM   Modules accepted: Orders

## 2020-08-29 NOTE — Telephone Encounter (Signed)
    Patient requesting refill for montelukast (SINGULAIR) 10 MG tablet Pharmacy [id number] - [place], [place] [address] AT [address]

## 2020-08-30 ENCOUNTER — Other Ambulatory Visit: Payer: Self-pay | Admitting: Allergy and Immunology

## 2020-09-02 ENCOUNTER — Other Ambulatory Visit (INDEPENDENT_AMBULATORY_CARE_PROVIDER_SITE_OTHER): Payer: Medicare Other

## 2020-09-02 ENCOUNTER — Other Ambulatory Visit: Payer: Self-pay

## 2020-09-02 DIAGNOSIS — B351 Tinea unguium: Secondary | ICD-10-CM | POA: Diagnosis not present

## 2020-09-02 LAB — HEPATIC FUNCTION PANEL
ALT: 23 U/L (ref 0–35)
AST: 14 U/L (ref 0–37)
Albumin: 4.2 g/dL (ref 3.5–5.2)
Alkaline Phosphatase: 57 U/L (ref 39–117)
Bilirubin, Direct: 0.1 mg/dL (ref 0.0–0.3)
Total Bilirubin: 0.6 mg/dL (ref 0.2–1.2)
Total Protein: 6.9 g/dL (ref 6.0–8.3)

## 2020-09-03 NOTE — Addendum Note (Signed)
Addended by: Binnie Rail on: 09/03/2020 03:23 PM   Modules accepted: Orders

## 2020-09-11 ENCOUNTER — Other Ambulatory Visit: Payer: Self-pay | Admitting: Allergy and Immunology

## 2020-09-12 ENCOUNTER — Encounter: Payer: Self-pay | Admitting: Internal Medicine

## 2020-09-12 ENCOUNTER — Telehealth (INDEPENDENT_AMBULATORY_CARE_PROVIDER_SITE_OTHER): Payer: Medicare Other | Admitting: Internal Medicine

## 2020-09-12 DIAGNOSIS — J329 Chronic sinusitis, unspecified: Secondary | ICD-10-CM | POA: Insufficient documentation

## 2020-09-12 DIAGNOSIS — J019 Acute sinusitis, unspecified: Secondary | ICD-10-CM | POA: Insufficient documentation

## 2020-09-12 MED ORDER — AMOXICILLIN-POT CLAVULANATE 875-125 MG PO TABS: 1.0000 | ORAL_TABLET | Freq: Two times a day (BID) | ORAL | 0 refills | Status: DC

## 2020-09-12 NOTE — Progress Notes (Signed)
Virtual Visit via Video Note  I connected with Heather Rivera on 09/12/20 at  3:30 PM EDT by a video enabled telemedicine application and verified that I am speaking with the correct person using two identifiers.   I discussed the limitations of evaluation and management by telemedicine and the availability of in person appointments. The patient expressed understanding and agreed to proceed.  Present for the visit:  Myself, Dr Billey Gosling, Howell Rucks.  The patient is currently at home and I am in the office.    No referring provider.    History of Present Illness: She is here for an acute visit for cold symptoms.   Her symptoms started a little over the week.  She states some left-sided sinus pain and pain that radiates down from her ear and posterior ear down her neck.  He has some mild congestion and maybe minimal sore throat, but also has allergies at this time a year.  She has had some headaches.  She was concerned because it was all in 1 side of the face, which made her think it was a sinus infection.   She has tried taking advil - helps.  She does flonase daily - no change.    She had her booster one week ago and this started prior.    Review of Systems  Constitutional: Negative for chills and fever.  HENT: Positive for congestion (some), ear pain (no ear popping), sinus pain (left side) and sore throat (minimal).   Respiratory: Negative for cough, shortness of breath and wheezing.   Neurological: Positive for headaches.      Social History   Socioeconomic History  . Marital status: Divorced    Spouse name: Not on file  . Number of children: 2  . Years of education: Not on file  . Highest education level: Not on file  Occupational History  . Not on file  Tobacco Use  . Smoking status: Former Smoker    Quit date: 11/12/1978    Years since quitting: 41.8  . Smokeless tobacco: Never Used  . Tobacco comment: Divorced, single and lives alone. 2 grown kids. 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 Vaping Use: Never used  Substance and Sexual Activity  . Alcohol use: Yes    Alcohol/week: 6.0 standard drinks    Types: 6 Glasses of wine per week    Comment: 6 glasses per week  . Drug use: No  . Sexual activity: Not Currently  Other Topics Concern  . Not on file  Social History Narrative  . Not on file   Social Determinants of Health   Financial Resource Strain:   . Difficulty of Paying Living Expenses: Not on file  Food Insecurity:   . Worried About Charity fundraiser in the Last Year: Not on file  . Ran Out of Food in the Last Year: Not on file  Transportation Needs:   . Lack of Transportation (Medical): Not on file  . Lack of Transportation (Non-Medical): Not on file  Physical Activity:   . Days of Exercise per Week: Not on file  . Minutes of Exercise per Session: Not on file  Stress:   . Feeling of Stress : Not on file  Social Connections:   . Frequency of Communication with Friends and Family: Not on file  . Frequency of Social Gatherings with Friends and Family: Not on file  . Attends Religious Services: Not on file  .  Active Member of Clubs or Organizations: Not on file  . Attends Archivist Meetings: Not on file  . Marital Status: Not on file     Observations/Objective: Appears well in NAD Breathing normally Skin appears warm and dry  Assessment and Plan:  See Problem List for Assessment and Plan of chronic medical problems.   Follow Up Instructions:    I discussed the assessment and treatment plan with the patient. The patient was provided an opportunity to ask questions and all were answered. The patient agreed with the plan and demonstrated an understanding of the instructions.   The patient was advised to call back or seek an in-person evaluation if the symptoms worsen or if the condition fails to improve as anticipated.    Binnie Rail, MD

## 2020-09-12 NOTE — Assessment & Plan Note (Signed)
Acute Symptoms suggestive of sinus infection Will start Augmentin 875-125 mg twice daily x10 days Continue allergy medications, Advil as needed Call if no improvement

## 2020-09-23 ENCOUNTER — Telehealth: Payer: Self-pay | Admitting: Internal Medicine

## 2020-09-23 NOTE — Telephone Encounter (Signed)
[place] with me if ok with PCP

## 2020-09-23 NOTE — Telephone Encounter (Signed)
   Patient requesting transfer of care from Dr Sharlet Salina to Dr Quay Burow

## 2020-09-23 NOTE — Telephone Encounter (Signed)
Fine

## 2020-09-27 NOTE — Progress Notes (Signed)
Subjective:    Patient ID: Heather Rivera, female    DOB: 10/10/1951, 69 y.o.   MRN: [id number]  HPI The patient is here for an acute visit.   Pain behind ear, neck pain-this pain started about 2 months ago.  She does have chronic muscle tension and discomfort in her upper back from work.  For the past couple of months she has noticed a pain behind the left ear/neck region.  She feels that is daily and it is a throbbing type pain on a scale of 3-4/10 in intensity.  It is not worse with head movements.  She has taken some Advil, but prefers not to take it too much and it did help a little, but did not resolve it.  She denies any posterior head pain.  She is no numbness, tingling or pain down the arms.  Recently she did take an antibiotic for a sinus infection.  She is not having any ear pain.  At times she does feel a dull headache and is unsure if this is sinus or not.     Medications and allergies reviewed with patient and updated if appropriate.  Patient Active Problem List   Diagnosis Date Noted  . Acute sinus infection 09/12/2020  . Onychomycosis 08/17/2020  . Tinea corporis 08/17/2020  . Eczema 08/17/2020  . Insomnia 06/17/2018  . Metatarsalgia of both feet 07/05/2017  . Rotator cuff syndrome of right shoulder 05/24/2017  . Routine general medical examination at a health care facility 02/10/2015  . Allergic rhinitis 10/23/2010    Current Outpatient Medications on File Prior to Visit  Medication Sig Dispense Refill  . b complex vitamins capsule Take 1 capsule by mouth daily.      Marland Kitchen loratadine (CLARITIN) 10 MG tablet Take 10 mg by mouth daily.    Marland Kitchen MAGNESIUM PO Take by mouth.    . montelukast (SINGULAIR) 10 MG tablet Take 1 tablet (10 mg total) by mouth at bedtime. 90 tablet 0  . terbinafine (LAMISIL) 250 MG tablet Take 1 tablet (250 mg total) by mouth daily. 90 tablet 0  . Ascorbic Acid (VITA-C PO) Take by mouth. (Patient not taking: Reported on 09/28/2020)    .  fluticasone (FLONASE) 50 MCG/ACT nasal spray Place 2 sprays into both nostrils daily. 16 g 6  . hydrocortisone 2.5 % cream Apply topically 2 (two) times daily. (Patient not taking: Reported on 09/28/2020) 30 g 0  . ketoconazole (NIZORAL) 2 % cream Apply 1 application topically daily. (Patient not taking: Reported on 09/28/2020) 30 g 0   No current facility-administered medications on file prior to visit.    Past Medical History:  Diagnosis Date  . ALLERGIC RHINITIS   . Female pattern baldness   . OBESITY     Past Surgical History:  Procedure Laterality Date  . [place]  . glass removed from finger  2010    Social History   Socioeconomic History  . Marital status: Divorced    Spouse name: Not on file  . Number of children: 2  . Years of education: Not on file  . Highest education level: Not on file  Occupational History  . Not on file  Tobacco Use  . Smoking status: Former Smoker    Quit date: 11/12/1978    Years since quitting: 41.9  . Smokeless tobacco: Never Used  . Tobacco comment: Divorced, single and lives alone. 2 grown kids. employed with hotel sales, [employer] and speaker presentation  [place]  Use  . Vaping Use: Never used  Substance and Sexual Activity  . Alcohol use: Yes    Alcohol/week: 6.0 standard drinks    Types: 6 Glasses of wine per week    Comment: 6 glasses per week  . Drug use: No  . Sexual activity: Not Currently  Other Topics Concern  . Not on file  Social History Narrative  . Not on file   Social Determinants of Health   Financial Resource Strain:   . Difficulty of Paying Living Expenses: Not on file  Food Insecurity:   . Worried About Charity fundraiser in the Last Year: Not on file  . Ran Out of Food in the Last Year: Not on file  Transportation Needs:   . Lack of Transportation (Medical): Not on file  . Lack of Transportation (Non-Medical): Not on file  Physical Activity:   . Days of Exercise per Week: Not on file  .  Minutes of Exercise per Session: Not on file  Stress:   . Feeling of Stress : Not on file  Social Connections:   . Frequency of Communication with Friends and Family: Not on file  . Frequency of Social Gatherings with Friends and Family: Not on file  . Attends Religious Services: Not on file  . Active Member of Clubs or Organizations: Not on file  . Attends Archivist Meetings: Not on file  . Marital Status: Not on file    Family History  Problem Relation Age of Onset  . Alcohol abuse Mother   . Arthritis Mother        grandparent  . COPD Mother   . Diabetes Mother   . Parkinson's disease Father   . Heart disease Brother   . Diabetes Maternal Grandmother     Review of Systems Per HPI    Objective:   Vitals:   09/28/20 1115  BP: 116/78  Pulse: 71  Temp: 98.1 F (36.7 C)  SpO2: 99%   BP Readings from Last 3 Encounters:  09/28/20 116/78  08/17/20 124/72  08/04/19 122/72   Wt Readings from Last 3 Encounters:  09/28/20 184 lb 9.6 oz (83.7 kg)  08/17/20 187 lb (84.8 kg)  04/28/19 192 lb 12.8 oz (87.5 kg)   Body mass index is 30.25 kg/m.   Physical Exam Constitutional:      General: She is not in acute distress.    Appearance: Normal appearance. She is not ill-appearing.  HENT:     Head: Normocephalic and atraumatic.     Left Ear: Tympanic membrane, ear canal and external ear normal. There is no impacted cerumen.  Neck:     Vascular: No carotid bruit.  Musculoskeletal:        General: Tenderness (Mild bilateral trapezius-left side worse than right side.  No cervical spine tenderness.  No increased pain with movement of the head) present.     Cervical back: Neck supple. No rigidity or tenderness.  Lymphadenopathy:     Cervical: No cervical adenopathy.  Skin:    General: Skin is warm and dry.  Neurological:     General: No focal deficit present.     Mental Status: She is alert.            Assessment & Plan:    See Problem List for  Assessment and Plan of chronic medical problems.    This visit occurred during the SARS-CoV-2 public health emergency.  Safety protocols were in place, including screening questions prior to  the visit, additional usage of staff PPE, and extensive cleaning of exam room while observing appropriate contact time as indicated for disinfecting solutions.

## 2020-09-28 ENCOUNTER — Encounter: Payer: Self-pay | Admitting: Internal Medicine

## 2020-09-28 ENCOUNTER — Other Ambulatory Visit: Payer: Self-pay

## 2020-09-28 ENCOUNTER — Ambulatory Visit (INDEPENDENT_AMBULATORY_CARE_PROVIDER_SITE_OTHER): Payer: Medicare Other | Admitting: Internal Medicine

## 2020-09-28 DIAGNOSIS — M542 Cervicalgia: Secondary | ICD-10-CM | POA: Diagnosis not present

## 2020-09-28 NOTE — Assessment & Plan Note (Signed)
Acute on chronic Some chronic bilateral neck and upper back pain and stiffness For the past 2 months having increased pain left posterior ear/neck region-not clearly musculoskeletal, but not related to her ear and no radiculopathy Likely MSK in nature Deferred any medication Can take Advil as needed We will try massage therapy or acupuncture Heat, topical arthritis/muscle medications She will let me know next month at her physical if she still having symptoms

## 2020-09-28 NOTE — Patient Instructions (Signed)
Your pain is likely muscular in nature.  Do all the natural things we discussed.   Please call if there is no improvement in your symptoms.

## 2020-10-17 ENCOUNTER — Encounter: Payer: Medicare Other | Admitting: Internal Medicine

## 2020-10-27 ENCOUNTER — Encounter: Payer: Self-pay | Admitting: Internal Medicine

## 2020-10-27 ENCOUNTER — Other Ambulatory Visit: Payer: Self-pay | Admitting: Internal Medicine

## 2020-10-27 DIAGNOSIS — R7303 Prediabetes: Secondary | ICD-10-CM | POA: Insufficient documentation

## 2020-10-27 NOTE — Patient Instructions (Addendum)
Blood work was ordered.     No immunization administered today.   Medications changes include :   none    A referral was ordered for GI, mammogram and bone density.     Someone will call you to schedule an appointment.    Please followup in 1 year   Health Maintenance, Female Adopting a healthy lifestyle and getting preventive care are important in promoting health and wellness. Ask your health care provider about:  The right schedule for you to have regular tests and exams.  Things you can do on your own to prevent diseases and keep yourself healthy. What should I know about diet, weight, and exercise? Eat a healthy diet   Eat a diet that includes plenty of vegetables, fruits, low-fat dairy products, and lean protein.  Do not eat a lot of foods that are high in solid fats, added sugars, or sodium. Maintain a healthy weight Body mass index (BMI) is used to identify weight problems. It estimates body fat based on height and weight. Your health care provider can help determine your BMI and help you achieve or maintain a healthy weight. Get regular exercise Get regular exercise. This is one of the most important things you can do for your health. Most adults should:  Exercise for at least 150 minutes each week. The exercise should increase your heart rate and make you sweat (moderate-intensity exercise).  Do strengthening exercises at least twice a week. This is in addition to the moderate-intensity exercise.  Spend less time sitting. Even light physical activity can be beneficial. Watch cholesterol and blood lipids Have your blood tested for lipids and cholesterol at 71 years of age, then have this test every 5 years. Have your cholesterol levels checked more often if:  Your lipid or cholesterol levels are high.  You are older than 71 years of age.  You are at high risk for heart disease. What should I know about cancer screening? Depending on your health history and  family history, you may need to have cancer screening at various ages. This may include screening for:  Breast cancer.  Cervical cancer.  Colorectal cancer.  Skin cancer.  Lung cancer. What should I know about heart disease, diabetes, and high blood pressure? Blood pressure and heart disease  High blood pressure causes heart disease and increases the risk of stroke. This is more likely to develop in people who have high blood pressure readings, are of African descent, or are overweight.  Have your blood pressure checked: ? Every 3-5 years if you are 73-4 years of age. ? Every year if you are 73 years old or older. Diabetes Have regular diabetes screenings. This checks your fasting blood sugar level. Have the screening done:  Once every three years after age 78 if you are at a normal weight and have a low risk for diabetes.  More often and at a younger age if you are overweight or have a high risk for diabetes. What should I know about preventing infection? Hepatitis B If you have a higher risk for hepatitis B, you should be screened for this virus. Talk with your health care provider to find out if you are at risk for hepatitis B infection. Hepatitis C Testing is recommended for:  Everyone born from [birth year] through 1967.  Anyone with known risk factors for hepatitis C. Sexually transmitted infections (STIs)  Get screened for STIs, including gonorrhea and chlamydia, if: ? You are sexually active and are younger than 71  years of age. ? You are older than 71 years of age and your health care provider tells you that you are at risk for this type of infection. ? Your sexual activity has changed since you were last screened, and you are at increased risk for chlamydia or gonorrhea. Ask your health care provider if you are at risk.  Ask your health care provider about whether you are at high risk for HIV. Your health care provider may recommend a prescription medicine to help prevent  HIV infection. If you choose to take medicine to prevent HIV, you should first get tested for HIV. You should then be tested every 3 months for as long as you are taking the medicine. Pregnancy  If you are about to stop having your period (premenopausal) and you may become pregnant, seek counseling before you get pregnant.  Take 400 to 800 micrograms (mcg) of folic acid every day if you become pregnant.  Ask for birth control (contraception) if you want to prevent pregnancy. Osteoporosis and menopause Osteoporosis is a disease in which the bones lose minerals and strength with aging. This can result in bone fractures. If you are 81 years old or older, or if you are at risk for osteoporosis and fractures, ask your health care provider if you should:  Be screened for bone loss.  Take a calcium or vitamin D supplement to lower your risk of fractures.  Be given hormone replacement therapy (HRT) to treat symptoms of menopause. Follow these instructions at home: Lifestyle  Do not use any products that contain nicotine or tobacco, such as cigarettes, e-cigarettes, and chewing tobacco. If you need help quitting, ask your health care provider.  Do not use street drugs.  Do not share needles.  Ask your health care provider for help if you need support or information about quitting drugs. Alcohol use  Do not drink alcohol if: ? Your health care provider tells you not to drink. ? You are pregnant, may be pregnant, or are planning to become pregnant.  If you drink alcohol: ? Limit how much you use to 0-1 drink a day. ? Limit intake if you are breastfeeding.  Be aware of how much alcohol is in your drink. In the [place] one drink equals one 12 oz bottle of beer (355 mL), one 5 oz glass of wine (148 mL), or one 1 oz glass of hard liquor (44 mL). General instructions  Schedule regular health, dental, and eye exams.  Stay current with your vaccines.  Tell your health care provider if: ? You  often feel depressed. ? You have ever been abused or do not feel safe at home. Summary  Adopting a healthy lifestyle and getting preventive care are important in promoting health and wellness.  Follow your health care provider's instructions about healthy diet, exercising, and getting tested or screened for diseases.  Follow your health care provider's instructions on monitoring your cholesterol and blood pressure. This information is not intended to replace advice given to you by your health care provider. Make sure you discuss any questions you have with your health care provider. Document Revised: 10/22/2018 Document Reviewed: 10/22/2018 Elsevier Patient Education  2020 [employer].

## 2020-10-27 NOTE — Progress Notes (Signed)
Subjective:    Patient ID: Heather Rivera, female    DOB: 09/07/1951, 69 y.o.   MRN: [id number]   This visit occurred during the SARS-CoV-2 public health emergency.  Safety protocols were in place, including screening questions prior to the visit, additional usage of staff PPE, and extensive cleaning of exam room while observing appropriate contact time as indicated for disinfecting solutions.    HPI She is here for a physical exam.   Tremor - R hand more than L.  Notices it more with doing something.  Dad had parkinson's. She denies issues with small writing or balance issues.    She has left shoulder pain.  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       Medications and allergies reviewed with patient and updated if appropriate.  Patient Active Problem List   Diagnosis Date Noted  . Benign essential tremor 10/28/2020  . Hyperglycemia 10/27/2020  . Neck pain 09/28/2020  . Onychomycosis 08/17/2020  . Tinea corporis 08/17/2020  . Eczema 08/17/2020  . Insomnia 06/17/2018  . Metatarsalgia of both feet 07/05/2017  . Rotator cuff syndrome of right shoulder 05/24/2017  . Allergic rhinitis 10/23/2010    Current Outpatient Medications on File Prior to Visit  Medication Sig Dispense Refill  . b complex vitamins capsule Take 1 capsule by mouth daily.    . fluticasone (FLONASE) 50 MCG/ACT nasal spray Place 2 sprays into both nostrils daily. 16 g 6  . loratadine (CLARITIN) 10 MG tablet Take 10 mg by mouth daily.    Marland Kitchen MAGNESIUM PO Take by mouth.    . montelukast (SINGULAIR) 10 MG tablet TAKE 1 TABLET BY MOUTH EVERYDAY AT BEDTIME 90 tablet 0  . terbinafine (LAMISIL) 250 MG tablet Take 1 tablet (250 mg total) by mouth daily. 90 tablet 0   No current facility-administered medications on file prior to visit.    Past Medical History:  Diagnosis Date  . ALLERGIC RHINITIS   . Female pattern baldness   . OBESITY     Past Surgical History:  Procedure Laterality Date   . [place]  . glass removed from finger  2010    Social History   Socioeconomic History  . Marital status: Divorced    Spouse name: Not on file  . Number of children: 2  . Years of education: Not on file  . Highest education level: Not on file  Occupational History  . Not on file  Tobacco Use  . Smoking status: Former Smoker    Quit date: 11/12/1978    Years since quitting: 41.9  . Smokeless tobacco: Never Used  . Tobacco comment: Divorced, single and lives alone. 2 grown kids. 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 Vaping Use: Never used  Substance and Sexual Activity  . Alcohol use: Yes    Alcohol/week: 6.0 standard drinks    Types: 6 Glasses of wine per week    Comment: 6 glasses per week  . Drug use: No  . Sexual activity: Not Currently  Other Topics Concern  . Not on file  Social History Narrative  . Not on file   Social Determinants of Health   Financial Resource Strain: Not on file  Food Insecurity: Not on file  Transportation Needs: Not on file  Physical Activity: Not on file  Stress: Not on file  Social Connections: Not on file    Family History  Problem Relation  Age of Onset  . Alcohol abuse Mother   . Arthritis Mother        grandparent  . COPD Mother   . Diabetes Mother   . Parkinson's disease Father   . Heart disease Brother   . Diabetes Maternal Grandmother     Review of Systems  Constitutional: Negative for chills and fever.  Eyes: Negative for visual disturbance.  Respiratory: Positive for cough (allergies, gerd related). Negative for shortness of breath and wheezing.   Cardiovascular: Negative for chest pain, palpitations and leg swelling.  Gastrointestinal: Negative for abdominal pain, blood in stool, constipation, diarrhea and nausea.       GERD   Genitourinary: Negative for dysuria and hematuria.  Musculoskeletal: Positive for arthralgias (left shoulder) and back pain.  Skin:  Negative for rash.  Neurological: Negative for light-headedness and headaches (occ).  Psychiatric/Behavioral: Positive for dysphoric mood (managed with exercise) and sleep disturbance. The patient is not nervous/anxious.        Objective:   Vitals:   10/28/20 1259  BP: 124/72  Pulse: 78  Temp: 98.5 F (36.9 C)  SpO2: 99%   Filed Weights   10/28/20 1259  Weight: 188 lb (85.3 kg)   Body mass index is 30.81 kg/m.  BP Readings from Last 3 Encounters:  10/28/20 124/72  09/28/20 116/78  08/17/20 124/72    Wt Readings from Last 3 Encounters:  10/28/20 188 lb (85.3 kg)  09/28/20 184 lb 9.6 oz (83.7 kg)  08/17/20 187 lb (84.8 kg)     Physical Exam Constitutional: She appears well-developed and well-nourished. No distress.  HENT:  Head: Normocephalic and atraumatic.  Right Ear: External ear normal. Normal ear canal and TM Left Ear: External ear normal.  Normal ear canal and TM Mouth/Throat: Oropharynx is clear and moist.  Eyes: Conjunctivae and EOM are normal.  Neck: Neck supple. No tracheal deviation present. No thyromegaly present.  No carotid bruit  Cardiovascular: Normal rate, regular rhythm and normal heart sounds.   No murmur heard.  No edema. Pulmonary/Chest: Effort normal and breath sounds normal. No respiratory distress. She has no wheezes. She has no rales.  Breast: deferred   Abdominal: Soft. She exhibits no distension. There is no tenderness.  Lymphadenopathy: She has no cervical adenopathy.  Skin: Skin is warm and dry. She is not diaphoretic.  Psychiatric: She has a normal mood and affect. Her behavior is normal.        Assessment & Plan:   Physical exam: Screening blood work    ordered Immunizations  Deferred tdap, others all up to date Colonoscopy  Never done - brother and mom had IBD - referred Mammogram  - due - scheduled Gyn  none Dexa  - due - scheduled Eye exams    Up to date - Dr Gershon Crane Exercise  regular Weight  Stable - wants to lose  weight - not very successful Substance abuse  none      See Problem List for Assessment and Plan of chronic medical problems.

## 2020-10-28 ENCOUNTER — Other Ambulatory Visit: Payer: Self-pay

## 2020-10-28 ENCOUNTER — Ambulatory Visit (INDEPENDENT_AMBULATORY_CARE_PROVIDER_SITE_OTHER): Payer: Medicare Other | Admitting: Internal Medicine

## 2020-10-28 VITALS — BP 124/72 | HR 78 | Temp 98.5°F | Ht 65.5 in | Wt 188.0 lb

## 2020-10-28 DIAGNOSIS — J3089 Other allergic rhinitis: Secondary | ICD-10-CM

## 2020-10-28 DIAGNOSIS — Z13820 Encounter for screening for osteoporosis: Secondary | ICD-10-CM

## 2020-10-28 DIAGNOSIS — Z0000 Encounter for general adult medical examination without abnormal findings: Secondary | ICD-10-CM | POA: Diagnosis not present

## 2020-10-28 DIAGNOSIS — M25512 Pain in left shoulder: Secondary | ICD-10-CM

## 2020-10-28 DIAGNOSIS — B351 Tinea unguium: Secondary | ICD-10-CM

## 2020-10-28 DIAGNOSIS — R739 Hyperglycemia, unspecified: Secondary | ICD-10-CM

## 2020-10-28 DIAGNOSIS — Z1211 Encounter for screening for malignant neoplasm of colon: Secondary | ICD-10-CM

## 2020-10-28 DIAGNOSIS — G250 Essential tremor: Secondary | ICD-10-CM | POA: Diagnosis not present

## 2020-10-28 DIAGNOSIS — M7502 Adhesive capsulitis of left shoulder: Secondary | ICD-10-CM | POA: Insufficient documentation

## 2020-10-28 DIAGNOSIS — Z1231 Encounter for screening mammogram for malignant neoplasm of breast: Secondary | ICD-10-CM

## 2020-10-28 LAB — CBC WITH DIFFERENTIAL/PLATELET
Basophils Absolute: 0.1 10*3/uL (ref 0.0–0.1)
Basophils Relative: 1.3 % (ref 0.0–3.0)
Eosinophils Absolute: 0.1 10*3/uL (ref 0.0–0.7)
Eosinophils Relative: 0.8 % (ref 0.0–5.0)
HCT: 40.1 % (ref 36.0–46.0)
Hemoglobin: 13.4 g/dL (ref 12.0–15.0)
Lymphocytes Relative: 31.6 % (ref 12.0–46.0)
Lymphs Abs: 2.3 10*3/uL (ref 0.7–4.0)
MCHC: 33.3 g/dL (ref 30.0–36.0)
MCV: 88.4 fl (ref 78.0–100.0)
Monocytes Absolute: 0.6 10*3/uL (ref 0.1–1.0)
Monocytes Relative: 8.9 % (ref 3.0–12.0)
Neutro Abs: 4.2 10*3/uL (ref 1.4–7.7)
Neutrophils Relative %: 57.4 % (ref 43.0–77.0)
Platelets: 380 10*3/uL (ref 150.0–400.0)
RBC: 4.54 Mil/uL (ref 3.87–5.11)
RDW: 13.8 % (ref 11.5–15.5)
WBC: 7.2 10*3/uL (ref 4.0–10.5)

## 2020-10-28 LAB — COMPREHENSIVE METABOLIC PANEL
ALT: 24 U/L (ref 0–35)
AST: 17 U/L (ref 0–37)
Albumin: 4.4 g/dL (ref 3.5–5.2)
Alkaline Phosphatase: 53 U/L (ref 39–117)
BUN: 23 mg/dL (ref 6–23)
CO2: 29 mEq/L (ref 19–32)
Calcium: 9.8 mg/dL (ref 8.4–10.5)
Chloride: 99 mEq/L (ref 96–112)
Creatinine, Ser: 0.92 mg/dL (ref 0.40–1.20)
GFR: 62.64 mL/min (ref >60.00)
Glucose, Bld: 84 mg/dL (ref 70–99)
Potassium: 3.8 mEq/L (ref 3.5–5.1)
Sodium: 136 mEq/L (ref 135–145)
Total Bilirubin: 0.5 mg/dL (ref 0.2–1.2)
Total Protein: 7.5 g/dL (ref 6.0–8.3)

## 2020-10-28 LAB — HEMOGLOBIN A1C: Hgb A1c MFr Bld: 5.9 % (ref 4.6–6.5)

## 2020-10-28 LAB — LIPID PANEL
Cholesterol: 281 mg/dL — ABNORMAL HIGH (ref 0–200)
HDL: 118.2 mg/dL (ref >39.00)
LDL Cholesterol: 151 mg/dL — ABNORMAL HIGH (ref 0–99)
NonHDL: 162.9
Total CHOL/HDL Ratio: 2
Triglycerides: 61 mg/dL (ref 0.0–149.0)
VLDL: 12.2 mg/dL (ref 0.0–40.0)

## 2020-10-28 LAB — TSH: TSH: 2.26 u[IU]/mL (ref 0.35–4.50)

## 2020-10-28 NOTE — Assessment & Plan Note (Signed)
Chronic Controlled Taking claritin, singulair daily  Uses flonase as needed

## 2020-10-28 NOTE — Assessment & Plan Note (Signed)
Chronic On lamisil Has seen improvement in nails cmp

## 2020-10-28 NOTE — Assessment & Plan Note (Signed)
Acute ? Injury while working out If worsens will see Dr Paulla Fore

## 2020-10-28 NOTE — Assessment & Plan Note (Signed)
Chronic Check a1c Low sugar / carb diet Stressed regular exercise  

## 2020-10-28 NOTE — Assessment & Plan Note (Signed)
Chronic R hand > L hand - occurs with activity, not at rest No other symptoms of Parkinson's - Dad did have Parkinson's She will monitor

## 2020-10-30 ENCOUNTER — Encounter: Payer: Self-pay | Admitting: Internal Medicine

## 2020-11-08 DIAGNOSIS — M25512 Pain in left shoulder: Secondary | ICD-10-CM | POA: Diagnosis not present

## 2020-11-08 DIAGNOSIS — M542 Cervicalgia: Secondary | ICD-10-CM | POA: Diagnosis not present

## 2020-11-08 DIAGNOSIS — M7502 Adhesive capsulitis of left shoulder: Secondary | ICD-10-CM | POA: Diagnosis not present

## 2020-11-08 DIAGNOSIS — M9901 Segmental and somatic dysfunction of cervical region: Secondary | ICD-10-CM | POA: Diagnosis not present

## 2020-12-20 DIAGNOSIS — M7502 Adhesive capsulitis of left shoulder: Secondary | ICD-10-CM | POA: Diagnosis not present

## 2020-12-20 DIAGNOSIS — M25512 Pain in left shoulder: Secondary | ICD-10-CM | POA: Diagnosis not present

## 2021-01-08 ENCOUNTER — Other Ambulatory Visit: Payer: Self-pay | Admitting: Internal Medicine

## 2021-01-09 NOTE — Telephone Encounter (Signed)
See below

## 2021-01-12 ENCOUNTER — Telehealth: Payer: Self-pay | Admitting: Internal Medicine

## 2021-01-12 NOTE — Telephone Encounter (Signed)
LVM for pt to rtn my call to schedule AWV with [facility]. Please schedule appt if pt calls the office.  

## 2021-02-15 DIAGNOSIS — M7502 Adhesive capsulitis of left shoulder: Secondary | ICD-10-CM | POA: Diagnosis not present

## 2021-02-15 DIAGNOSIS — M25512 Pain in left shoulder: Secondary | ICD-10-CM | POA: Diagnosis not present

## 2021-02-21 DIAGNOSIS — M7502 Adhesive capsulitis of left shoulder: Secondary | ICD-10-CM | POA: Diagnosis not present

## 2021-02-24 DIAGNOSIS — M7502 Adhesive capsulitis of left shoulder: Secondary | ICD-10-CM | POA: Diagnosis not present

## 2021-02-28 DIAGNOSIS — M7502 Adhesive capsulitis of left shoulder: Secondary | ICD-10-CM | POA: Diagnosis not present

## 2021-03-02 DIAGNOSIS — M7502 Adhesive capsulitis of left shoulder: Secondary | ICD-10-CM | POA: Diagnosis not present

## 2021-03-07 DIAGNOSIS — M7502 Adhesive capsulitis of left shoulder: Secondary | ICD-10-CM | POA: Diagnosis not present

## 2021-03-09 DIAGNOSIS — M7502 Adhesive capsulitis of left shoulder: Secondary | ICD-10-CM | POA: Diagnosis not present

## 2021-03-14 DIAGNOSIS — M7502 Adhesive capsulitis of left shoulder: Secondary | ICD-10-CM | POA: Diagnosis not present

## 2021-03-16 ENCOUNTER — Telehealth: Payer: Self-pay | Admitting: Internal Medicine

## 2021-03-17 NOTE — Telephone Encounter (Signed)
Left message for patient. She can use the good rx card and pay for script out of pocket until insurance will cover it on the 10th when she is due.

## 2021-03-17 NOTE — Telephone Encounter (Signed)
Patient has been taking two because of her allergies so the pharmacy wont fill it until the 10th, she is wondering if we can change the directions so she can get it early

## 2021-03-17 NOTE — Telephone Encounter (Signed)
The maximum dose of Singulair is 1 pill a day.  Therefore insurance will likely not cover 2 pills a day.  If she needed the medication to get through until when her insurance would cover it she could always do good Rx with a separate prescription and pay for it out-of-pocket.

## 2021-03-21 DIAGNOSIS — M7502 Adhesive capsulitis of left shoulder: Secondary | ICD-10-CM | POA: Diagnosis not present

## 2021-04-06 DIAGNOSIS — M7502 Adhesive capsulitis of left shoulder: Secondary | ICD-10-CM | POA: Diagnosis not present

## 2021-06-11 ENCOUNTER — Other Ambulatory Visit: Payer: Self-pay | Admitting: Internal Medicine

## 2021-07-11 NOTE — Patient Instructions (Addendum)
Blood work was ordered.    Flu vaccine given today.    Medications changes include :   ambien as needed for sleep.   Consider primidone for your tremor  Your prescription(s) have been submitted to your pharmacy. Please take as directed and contact our office if you believe you are having problem(s) with the medication(s).    Please followup in 1year    Health Maintenance, Female Adopting a healthy lifestyle and getting preventive care are important in promoting health and wellness. Ask your health care provider about: The right schedule for you to have regular tests and exams. Things you can do on your own to prevent diseases and keep yourself healthy. What should I know about diet, weight, and exercise? Eat a healthy diet  Eat a diet that includes plenty of vegetables, fruits, low-fat dairy products, and lean protein. Do not eat a lot of foods that are high in solid fats, added sugars, or sodium.  Maintain a healthy weight Body mass index (BMI) is used to identify weight problems. It estimates body fat based on height and weight. Your health care provider can help determineyour BMI and help you achieve or maintain a healthy weight. Get regular exercise Get regular exercise. This is one of the most important things you can do for your health. Most adults should: Exercise for at least 150 minutes each week. The exercise should increase your heart rate and make you sweat (moderate-intensity exercise). Do strengthening exercises at least twice a week. This is in addition to the moderate-intensity exercise. Spend less time sitting. Even light physical activity can be beneficial. Watch cholesterol and blood lipids Have your blood tested for lipids and cholesterol at 72 years of age, then havethis test every 5 years. Have your cholesterol levels checked more often if: Your lipid or cholesterol levels are high. You are older than 72 years of age. You are at high risk for heart  disease. What should I know about cancer screening? Depending on your health history and family history, you may need to have cancer screening at various ages. This may include screening for: Breast cancer. Cervical cancer. Colorectal cancer. Skin cancer. Lung cancer. What should I know about heart disease, diabetes, and high blood pressure? Blood pressure and heart disease High blood pressure causes heart disease and increases the risk of stroke. This is more likely to develop in people who have high blood pressure readings, are of African descent, or are overweight. Have your blood pressure checked: Every 3-5 years if you are 57-73 years of age. Every year if you are 35 years old or older. Diabetes Have regular diabetes screenings. This checks your fasting blood sugar level. Have the screening done: Once every three years after age 47 if you are at a normal weight and have a low risk for diabetes. More often and at a younger age if you are overweight or have a high risk for diabetes. What should I know about preventing infection? Hepatitis B If you have a higher risk for hepatitis B, you should be screened for this virus. Talk with your health care provider to find out if you are at risk forhepatitis B infection. Hepatitis C Testing is recommended for: Everyone born from [birth year] through 1967. Anyone with known risk factors for hepatitis C. Sexually transmitted infections (STIs) Get screened for STIs, including gonorrhea and chlamydia, if: You are sexually active and are younger than 72 years of age. You are older than 72 years of age and your health  care provider tells you that you are at risk for this type of infection. Your sexual activity has changed since you were last screened, and you are at increased risk for chlamydia or gonorrhea. Ask your health care provider if you are at risk. Ask your health care provider about whether you are at high risk for HIV. Your health care provider  may recommend a prescription medicine to help prevent HIV infection. If you choose to take medicine to prevent HIV, you should first get tested for HIV. You should then be tested every 3 months for as long as you are taking the medicine. Pregnancy If you are about to stop having your period (premenopausal) and you may become pregnant, seek counseling before you get pregnant. Take 400 to 800 micrograms (mcg) of folic acid every day if you become pregnant. Ask for birth control (contraception) if you want to prevent pregnancy. Osteoporosis and menopause Osteoporosis is a disease in which the bones lose minerals and strength with aging. This can result in bone fractures. If you are 5 years old or older, or if you are at risk for osteoporosis and fractures, ask your health care provider if you should: Be screened for bone loss. Take a calcium or vitamin D supplement to lower your risk of fractures. Be given hormone replacement therapy (HRT) to treat symptoms of menopause. Follow these instructions at home: Lifestyle Do not use any products that contain nicotine or tobacco, such as cigarettes, e-cigarettes, and chewing tobacco. If you need help quitting, ask your health care provider. Do not use street drugs. Do not share needles. Ask your health care provider for help if you need support or information about quitting drugs. Alcohol use Do not drink alcohol if: Your health care provider tells you not to drink. You are pregnant, may be pregnant, or are planning to become pregnant. If you drink alcohol: Limit how much you use to 0-1 drink a day. Limit intake if you are breastfeeding. Be aware of how much alcohol is in your drink. In the [place] one drink equals one 12 oz bottle of beer (355 mL), one 5 oz glass of wine (148 mL), or one 1 oz glass of hard liquor (44 mL). General instructions Schedule regular health, dental, and eye exams. Stay current with your vaccines. Tell your health care  provider if: You often feel depressed. You have ever been abused or do not feel safe at home. Summary Adopting a healthy lifestyle and getting preventive care are important in promoting health and wellness. Follow your health care provider's instructions about healthy diet, exercising, and getting tested or screened for diseases. Follow your health care provider's instructions on monitoring your cholesterol and blood pressure. This information is not intended to replace advice given to you by your health care provider. Make sure you discuss any questions you have with your healthcare provider. Document Revised: 10/22/2018 Document Reviewed: 10/22/2018 Elsevier Patient Education  2022 [employer].

## 2021-07-11 NOTE — Progress Notes (Signed)
Subjective:    Patient ID: Heather Rivera, female    DOB: 1951/08/18, 70 y.o.   MRN: [id number]   This visit occurred during the SARS-CoV-2 public health emergency.  Safety protocols were in place, including screening questions prior to the visit, additional usage of staff PPE, and extensive cleaning of exam room while observing appropriate contact time as indicated for disinfecting solutions.    HPI She is here for a physical exam.   She is doing [employer].  She has lost 20 lbs.  Smashed right 2nd toe on a weight on the florr.  It is improving, but it has been slow.   Has 4th right toe pain - it is a hammer toe.  It is intermittently painful - nerve pain and numb.  She has seen podiatry in the past and surgery was recommended and she does not want to have surgery.  Skin lump anterior left elbow.  She noticed it recently.  It has not grown in size.  There is no tenderness.  Occ diff sleeping.  Has taken a gummy and in past has taken [place].  Ideally she would like to have Ambien on hand and just take it as needed   Medications and allergies reviewed with patient and updated if appropriate.  Patient Active Problem List   Diagnosis Date Noted   Benign essential tremor 10/28/2020   Adhesive capsulitis of left shoulder 10/28/2020   Prediabetes 10/27/2020   Neck pain 09/28/2020   Onychomycosis 08/17/2020   Tinea corporis 08/17/2020   Eczema 08/17/2020   Insomnia 06/17/2018   Metatarsalgia of both feet 07/05/2017   Rotator cuff syndrome of right shoulder 05/24/2017   Allergic rhinitis 10/23/2010    Current Outpatient Medications on File Prior to Visit  Medication Sig Dispense Refill   ASHWAGANDHA PO Take by mouth daily.     fluticasone (FLONASE) 50 MCG/ACT nasal spray Place 2 sprays into both nostrils daily. 16 g 6   loratadine (CLARITIN) 10 MG tablet Take 10 mg by mouth daily.     MAGNESIUM PO Take by mouth.     montelukast (SINGULAIR) 10 MG tablet TAKE 1 TABLET BY MOUTH EVERYDAY  AT BEDTIME 90 tablet 0   Probiotic Product (PROBIOTIC DAILY PO) Take by mouth.     No current facility-administered medications on file prior to visit.    Past Medical History:  Diagnosis Date   ALLERGIC RHINITIS    Female pattern baldness    OBESITY     Past Surgical History:  Procedure Laterality Date   CESAREAN SECTION  1982   glass removed from finger  2010    Social History   Socioeconomic History   Marital status: Divorced    Spouse name: Not on file   Number of children: 2   Years of education: Not on file   Highest education level: Not on file  Occupational History   Not on file  Tobacco Use   Smoking status: Former    Types: Cigarettes    Quit date: 11/12/1978    Years since quitting: 42.6   Smokeless tobacco: Never   Tobacco comments:    Divorced, single and lives alone. 2 grown kids. employed with hotel sales, [employer] and speaker presentation  Vaping Use   Vaping Use: Never used  Substance and Sexual Activity   Alcohol use: Yes    Alcohol/week: 6.0 standard drinks    Types: 6 Glasses of wine per week    Comment: 6 glasses per week  Drug use: No   Sexual activity: Not Currently  Other Topics Concern   Not on file  Social History Narrative   Not on file   Social Determinants of Health   Financial Resource Strain: Not on file  Food Insecurity: Not on file  Transportation Needs: Not on file  Physical Activity: Not on file  Stress: Not on file  Social Connections: Not on file    Family History  Problem Relation Age of Onset   Alcohol abuse Mother    Arthritis Mother        grandparent   COPD Mother    Diabetes Mother    Parkinson's disease Father    Heart disease Brother    Diabetes Maternal Grandmother     Review of Systems  Constitutional:  Negative for chills and fever.  Eyes:  Negative for visual disturbance.  Respiratory:  Positive for cough (occ - gerd related). Negative for shortness of breath and wheezing.   Cardiovascular:   Negative for chest pain, palpitations and leg swelling.  Gastrointestinal:  Positive for constipation (occ). Negative for abdominal pain, blood in stool, diarrhea and nausea.       Occ gerd - improved with diet, weight loss  Genitourinary:  Negative for dysuria and hematuria.  Musculoskeletal:  Positive for arthralgias (left shoulder) and back pain (chronic better - stretching daily).  Skin:  Negative for rash.       new moles  Neurological:  Positive for headaches (occ). Negative for light-headedness.  Psychiatric/Behavioral:  Positive for dysphoric mood (mild). The patient is nervous/anxious (mild).       Objective:   Vitals:   07/12/21 0925  BP: 116/82  Pulse: 71  Temp: 98.1 F (36.7 C)  SpO2: 98%   Filed Weights   07/12/21 0925  Weight: 162 lb 6.4 oz (73.7 kg)   Body mass index is 26.61 kg/m.  BP Readings from Last 3 Encounters:  07/12/21 116/82  10/28/20 124/72  09/28/20 116/78    Wt Readings from Last 3 Encounters:  07/12/21 162 lb 6.4 oz (73.7 kg)  10/28/20 188 lb (85.3 kg)  09/28/20 184 lb 9.6 oz (83.7 kg)     Physical Exam Constitutional: She appears well-developed and well-nourished. No distress.  HENT:  Head: Normocephalic and atraumatic.  Right Ear: External ear normal. Normal ear canal and TM Left Ear: External ear normal.  Normal ear canal and TM Mouth/Throat: Oropharynx is clear and moist.  Eyes: Conjunctivae and EOM are normal.  Neck: Neck supple. No tracheal deviation present. No thyromegaly present.  No carotid bruit  Cardiovascular: Normal rate, regular rhythm and normal heart sounds.   No murmur heard.  No edema. Pulmonary/Chest: Effort normal and breath sounds normal. No respiratory distress. She has no wheezes. She has no rales.  Breast: deferred   Abdominal: Soft. She exhibits no distension. There is no tenderness.  Lymphadenopathy: She has no cervical adenopathy.  Skin: Skin is warm and dry. She is not diaphoretic.  Soft lump in the skin  anterior left elbow that is suggestive of a lipoma Psychiatric: She has a normal mood and affect. Her behavior is normal.     Lab Results  Component Value Date   WBC 7.2 10/28/2020   HGB 13.4 10/28/2020   HCT 40.1 10/28/2020   PLT 380.0 10/28/2020   GLUCOSE 84 10/28/2020   CHOL 281 (H) 10/28/2020   TRIG 61.0 10/28/2020   HDL 118.20 10/28/2020   LDLCALC 151 (H) 10/28/2020   ALT 24 10/28/2020  AST 17 10/28/2020   NA 136 10/28/2020   K 3.8 10/28/2020   CL 99 10/28/2020   CREATININE 0.92 10/28/2020   BUN 23 10/28/2020   CO2 29 10/28/2020   TSH 2.26 10/28/2020   HGBA1C 5.9 10/28/2020         Assessment & Plan:   Physical exam: Screening blood work  ordered Exercise  walking some Weight  encouraged weight loss Substance abuse  none   Reviewed recommended immunizations.  Never had a colonoscopy - discussed.   Health Maintenance  Topic Date Due   Hepatitis C Screening  Never done   COLONOSCOPY (Pts 45-22yr Insurance coverage will need to be confirmed)  Never done   MAMMOGRAM  03/08/2008   DEXA SCAN  Never done   INFLUENZA VACCINE  06/12/2021   TETANUS/TDAP  10/28/2021 (Originally 11/12/2018)   COVID-19 Vaccine  Completed   PNA vac Low Risk Adult  Completed   Zoster Vaccines- Shingrix  Completed   HPV VACCINES  Aged Out          See Problem List for Assessment and Plan of chronic medical problems.

## 2021-07-12 ENCOUNTER — Other Ambulatory Visit: Payer: Self-pay

## 2021-07-12 ENCOUNTER — Encounter: Payer: Self-pay | Admitting: Internal Medicine

## 2021-07-12 ENCOUNTER — Ambulatory Visit (INDEPENDENT_AMBULATORY_CARE_PROVIDER_SITE_OTHER): Payer: Medicare Other | Admitting: Internal Medicine

## 2021-07-12 VITALS — BP 116/82 | HR 71 | Temp 98.1°F | Ht 65.5 in | Wt 162.4 lb

## 2021-07-12 DIAGNOSIS — R7303 Prediabetes: Secondary | ICD-10-CM

## 2021-07-12 DIAGNOSIS — G8929 Other chronic pain: Secondary | ICD-10-CM | POA: Diagnosis not present

## 2021-07-12 DIAGNOSIS — F5101 Primary insomnia: Secondary | ICD-10-CM

## 2021-07-12 DIAGNOSIS — D1722 Benign lipomatous neoplasm of skin and subcutaneous tissue of left arm: Secondary | ICD-10-CM

## 2021-07-12 DIAGNOSIS — M545 Low back pain, unspecified: Secondary | ICD-10-CM

## 2021-07-12 DIAGNOSIS — M7502 Adhesive capsulitis of left shoulder: Secondary | ICD-10-CM

## 2021-07-12 DIAGNOSIS — M549 Dorsalgia, unspecified: Secondary | ICD-10-CM | POA: Insufficient documentation

## 2021-07-12 DIAGNOSIS — J3089 Other allergic rhinitis: Secondary | ICD-10-CM | POA: Diagnosis not present

## 2021-07-12 DIAGNOSIS — Z23 Encounter for immunization: Secondary | ICD-10-CM

## 2021-07-12 DIAGNOSIS — G250 Essential tremor: Secondary | ICD-10-CM

## 2021-07-12 DIAGNOSIS — D179 Benign lipomatous neoplasm, unspecified: Secondary | ICD-10-CM | POA: Insufficient documentation

## 2021-07-12 DIAGNOSIS — Z0000 Encounter for general adult medical examination without abnormal findings: Secondary | ICD-10-CM | POA: Diagnosis not present

## 2021-07-12 LAB — COMPREHENSIVE METABOLIC PANEL
ALT: 24 U/L (ref 0–35)
AST: 17 U/L (ref 0–37)
Albumin: 4.2 g/dL (ref 3.5–5.2)
Alkaline Phosphatase: 52 U/L (ref 39–117)
BUN: 13 mg/dL (ref 6–23)
CO2: 30 mEq/L (ref 19–32)
Calcium: 10.3 mg/dL (ref 8.4–10.5)
Chloride: 99 mEq/L (ref 96–112)
Creatinine, Ser: 0.78 mg/dL (ref 0.40–1.20)
GFR: 75.99 mL/min (ref >60.00)
Glucose, Bld: 85 mg/dL (ref 70–99)
Potassium: 4 mEq/L (ref 3.5–5.1)
Sodium: 137 mEq/L (ref 135–145)
Total Bilirubin: 0.9 mg/dL (ref 0.2–1.2)
Total Protein: 7.3 g/dL (ref 6.0–8.3)

## 2021-07-12 LAB — HEMOGLOBIN A1C: Hgb A1c MFr Bld: 5.8 % (ref 4.6–6.5)

## 2021-07-12 LAB — LIPID PANEL
Cholesterol: 293 mg/dL — ABNORMAL HIGH (ref 0–200)
HDL: 117.7 mg/dL (ref >39.00)
LDL Cholesterol: 162 mg/dL — ABNORMAL HIGH (ref 0–99)
NonHDL: 175.75
Total CHOL/HDL Ratio: 2
Triglycerides: 71 mg/dL (ref 0.0–149.0)
VLDL: 14.2 mg/dL (ref 0.0–40.0)

## 2021-07-12 LAB — CBC WITH DIFFERENTIAL/PLATELET
Basophils Absolute: 0 10*3/uL (ref 0.0–0.1)
Basophils Relative: 0.7 % (ref 0.0–3.0)
Eosinophils Absolute: 0.1 10*3/uL (ref 0.0–0.7)
Eosinophils Relative: 1 % (ref 0.0–5.0)
HCT: 40.9 % (ref 36.0–46.0)
Hemoglobin: 13.6 g/dL (ref 12.0–15.0)
Lymphocytes Relative: 20.7 % (ref 12.0–46.0)
Lymphs Abs: 1.4 10*3/uL (ref 0.7–4.0)
MCHC: 33.3 g/dL (ref 30.0–36.0)
MCV: 89.6 fl (ref 78.0–100.0)
Monocytes Absolute: 0.5 10*3/uL (ref 0.1–1.0)
Monocytes Relative: 7.7 % (ref 3.0–12.0)
Neutro Abs: 4.8 10*3/uL (ref 1.4–7.7)
Neutrophils Relative %: 69.9 % (ref 43.0–77.0)
Platelets: 318 10*3/uL (ref 150.0–400.0)
RBC: 4.57 Mil/uL (ref 3.87–5.11)
RDW: 14 % (ref 11.5–15.5)
WBC: 6.8 10*3/uL (ref 4.0–10.5)

## 2021-07-12 LAB — TSH: TSH: 2.07 u[IU]/mL (ref 0.35–5.50)

## 2021-07-12 MED ORDER — MONTELUKAST SODIUM 10 MG PO TABS: ORAL_TABLET | ORAL | 0 refills | Status: DC

## 2021-07-12 MED ORDER — ZOLPIDEM TARTRATE 5 MG PO TABS: 5.0000 mg | ORAL_TABLET | Freq: Every evening | ORAL | 0 refills | Status: DC | PRN

## 2021-07-12 NOTE — Assessment & Plan Note (Signed)
Chronic Check a1c Low sugar / carb diet Stressed regular exercise  

## 2021-07-12 NOTE — Assessment & Plan Note (Signed)
Chronic Intermittent Uses over-the-counter Gummies as needed In the past has been on Ambien and rarely took it and would like to have on hand just in case, which I think is reasonable To only use this if needed Start Ambien 5 mg nightly.

## 2021-07-12 NOTE — Assessment & Plan Note (Signed)
Chronic Intermittent Manageable

## 2021-07-12 NOTE — Assessment & Plan Note (Signed)
Chronic Controlled Continue Flonase nasal spray, Claritin, Singulair 10 mg nightly

## 2021-07-12 NOTE — Assessment & Plan Note (Signed)
She saw Dr. Paulla Fore and has adhesive capsulitis She has done physical therapy Improved

## 2021-07-12 NOTE — Assessment & Plan Note (Signed)
New  Left anterior elbow Nontender Reassured

## 2021-07-12 NOTE — Assessment & Plan Note (Addendum)
Chronic A little worse R hand > L hand Discussed medication options-propranolol, which would not be ideal given her low blood pressure and primidone, which she will look into Also discussed gabapentin

## 2021-07-21 DIAGNOSIS — L570 Actinic keratosis: Secondary | ICD-10-CM | POA: Diagnosis not present

## 2021-07-21 DIAGNOSIS — L820 Inflamed seborrheic keratosis: Secondary | ICD-10-CM | POA: Diagnosis not present

## 2021-09-08 ENCOUNTER — Other Ambulatory Visit: Payer: Self-pay | Admitting: Internal Medicine

## 2021-11-28 DIAGNOSIS — L570 Actinic keratosis: Secondary | ICD-10-CM | POA: Diagnosis not present

## 2021-11-28 DIAGNOSIS — L720 Epidermal cyst: Secondary | ICD-10-CM | POA: Diagnosis not present

## 2021-12-07 ENCOUNTER — Other Ambulatory Visit: Payer: Self-pay | Admitting: Internal Medicine

## 2021-12-11 ENCOUNTER — Other Ambulatory Visit: Payer: Self-pay | Admitting: Internal Medicine

## 2022-02-20 DIAGNOSIS — M9905 Segmental and somatic dysfunction of pelvic region: Secondary | ICD-10-CM | POA: Diagnosis not present

## 2022-02-20 DIAGNOSIS — M9903 Segmental and somatic dysfunction of lumbar region: Secondary | ICD-10-CM | POA: Diagnosis not present

## 2022-02-20 DIAGNOSIS — M545 Low back pain, unspecified: Secondary | ICD-10-CM | POA: Diagnosis not present

## 2022-02-20 DIAGNOSIS — M9901 Segmental and somatic dysfunction of cervical region: Secondary | ICD-10-CM | POA: Diagnosis not present

## 2022-02-20 DIAGNOSIS — M546 Pain in thoracic spine: Secondary | ICD-10-CM | POA: Diagnosis not present

## 2022-02-20 DIAGNOSIS — M9907 Segmental and somatic dysfunction of upper extremity: Secondary | ICD-10-CM | POA: Diagnosis not present

## 2022-02-20 DIAGNOSIS — M9908 Segmental and somatic dysfunction of rib cage: Secondary | ICD-10-CM | POA: Diagnosis not present

## 2022-02-20 DIAGNOSIS — M9902 Segmental and somatic dysfunction of thoracic region: Secondary | ICD-10-CM | POA: Diagnosis not present

## 2022-02-24 ENCOUNTER — Other Ambulatory Visit: Payer: Self-pay | Admitting: Internal Medicine

## 2022-03-06 DIAGNOSIS — M542 Cervicalgia: Secondary | ICD-10-CM | POA: Diagnosis not present

## 2022-03-06 DIAGNOSIS — M9906 Segmental and somatic dysfunction of lower extremity: Secondary | ICD-10-CM | POA: Diagnosis not present

## 2022-03-06 DIAGNOSIS — M79671 Pain in right foot: Secondary | ICD-10-CM | POA: Diagnosis not present

## 2022-04-13 ENCOUNTER — Ambulatory Visit: Payer: Medicare Other | Admitting: Family Medicine

## 2022-04-19 ENCOUNTER — Telehealth: Payer: Self-pay | Admitting: Internal Medicine

## 2022-04-19 NOTE — Telephone Encounter (Signed)
Left message for patient to call back to schedule Medicare Annual Wellness Visit   Last AWV  06/05/18  Please schedule at anytime with [facility] if patient calls the office back.     Any questions, please call me at [phone]

## 2022-05-02 ENCOUNTER — Telehealth: Payer: Self-pay | Admitting: Internal Medicine

## 2022-05-02 NOTE — Telephone Encounter (Signed)
LVM for pt to rtn my call at [phone] to schedule AWV with [facility]

## 2022-05-07 ENCOUNTER — Telehealth: Payer: Self-pay | Admitting: Internal Medicine

## 2022-05-23 ENCOUNTER — Other Ambulatory Visit: Payer: Self-pay | Admitting: Internal Medicine

## 2022-06-05 ENCOUNTER — Encounter: Payer: Self-pay | Admitting: Internal Medicine

## 2022-06-05 DIAGNOSIS — E785 Hyperlipidemia, unspecified: Secondary | ICD-10-CM | POA: Insufficient documentation

## 2022-06-05 NOTE — Patient Instructions (Addendum)
A mammogram and bone density scan was ordered.   Call and schedule your mammogram and bone density - [facility] Schedule an appointment by calling [phone]    Blood work was ordered.     Medications changes include :   lunesta 2 mg at night as needed for sleep   Your prescription(s) have been sent to your pharmacy.    A referral was ordered for neurology - Dr Tat.     Someone from that office will call you to schedule an appointment.    Return in about 1 year (around 06/07/2023) for Physical Exam.    Health Maintenance, Female Adopting a healthy lifestyle and getting preventive care are important in promoting health and wellness. Ask your health care provider about: The right schedule for you to have regular tests and exams. Things you can do on your own to prevent diseases and keep yourself healthy. What should I know about diet, weight, and exercise? Eat a healthy diet  Eat a diet that includes plenty of vegetables, fruits, low-fat dairy products, and lean protein. Do not eat a lot of foods that are high in solid fats, added sugars, or sodium. Maintain a healthy weight Body mass index (BMI) is used to identify weight problems. It estimates body fat based on height and weight. Your health care provider can help determine your BMI and help you achieve or maintain a healthy weight. Get regular exercise Get regular exercise. This is one of the most important things you can do for your health. Most adults should: Exercise for at least 150 minutes each week. The exercise should increase your heart rate and make you sweat (moderate-intensity exercise). Do strengthening exercises at least twice a week. This is in addition to the moderate-intensity exercise. Spend less time sitting. Even light physical activity can be beneficial. Watch cholesterol and blood lipids Have your blood tested for lipids and cholesterol at 73 years of age, then have  this test every 5 years. Have your cholesterol levels checked more often if: Your lipid or cholesterol levels are high. You are older than 73 years of age. You are at high risk for heart disease. What should I know about cancer screening? Depending on your health history and family history, you may need to have cancer screening at various ages. This may include screening for: Breast cancer. Cervical cancer. Colorectal cancer. Skin cancer. Lung cancer. What should I know about heart disease, diabetes, and high blood pressure? Blood pressure and heart disease High blood pressure causes heart disease and increases the risk of stroke. This is more likely to develop in people who have high blood pressure readings or are overweight. Have your blood pressure checked: Every 3-5 years if you are 18-61 years of age. Every year if you are 34 years old or older. Diabetes Have regular diabetes screenings. This checks your fasting blood sugar level. Have the screening done: Once every three years after age 74 if you are at a normal weight and have a low risk for diabetes. More often and at a younger age if you are overweight or have a high risk for diabetes. What should I know about preventing infection? Hepatitis B If you have a higher risk for hepatitis B, you should be screened for this virus. Talk with your health care provider to find out if you are at risk for hepatitis B infection. Hepatitis C Testing is recommended for: Everyone born from [birth year] through 1967. Anyone with  known risk factors for hepatitis C. Sexually transmitted infections (STIs) Get screened for STIs, including gonorrhea and chlamydia, if: You are sexually active and are younger than 73 years of age. You are older than 73 years of age and your health care provider tells you that you are at risk for this type of infection. Your sexual activity has changed since you were last screened, and you are at increased risk for  chlamydia or gonorrhea. Ask your health care provider if you are at risk. Ask your health care provider about whether you are at high risk for HIV. Your health care provider may recommend a prescription medicine to help prevent HIV infection. If you choose to take medicine to prevent HIV, you should first get tested for HIV. You should then be tested every 3 months for as long as you are taking the medicine. Pregnancy If you are about to stop having your period (premenopausal) and you may become pregnant, seek counseling before you get pregnant. Take 400 to 800 micrograms (mcg) of folic acid every day if you become pregnant. Ask for birth control (contraception) if you want to prevent pregnancy. Osteoporosis and menopause Osteoporosis is a disease in which the bones lose minerals and strength with aging. This can result in bone fractures. If you are 81 years old or older, or if you are at risk for osteoporosis and fractures, ask your health care provider if you should: Be screened for bone loss. Take a calcium or vitamin D supplement to lower your risk of fractures. Be given hormone replacement therapy (HRT) to treat symptoms of menopause. Follow these instructions at home: Alcohol use Do not drink alcohol if: Your health care provider tells you not to drink. You are pregnant, may be pregnant, or are planning to become pregnant. If you drink alcohol: Limit how much you have to: 0-1 drink a day. Know how much alcohol is in your drink. In the [place] one drink equals one 12 oz bottle of beer (355 mL), one 5 oz glass of wine (148 mL), or one 1 oz glass of hard liquor (44 mL). Lifestyle Do not use any products that contain nicotine or tobacco. These products include cigarettes, chewing tobacco, and vaping devices, such as e-cigarettes. If you need help quitting, ask your health care provider. Do not use street drugs. Do not share needles. Ask your health care provider for help if you need support  or information about quitting drugs. General instructions Schedule regular health, dental, and eye exams. Stay current with your vaccines. Tell your health care provider if: You often feel depressed. You have ever been abused or do not feel safe at home. Summary Adopting a healthy lifestyle and getting preventive care are important in promoting health and wellness. Follow your health care provider's instructions about healthy diet, exercising, and getting tested or screened for diseases. Follow your health care provider's instructions on monitoring your cholesterol and blood pressure. This information is not intended to replace advice given to you by your health care provider. Make sure you discuss any questions you have with your health care provider. Document Revised: 03/20/2021 Document Reviewed: 03/20/2021 Elsevier Patient Education  [place].

## 2022-06-05 NOTE — Progress Notes (Addendum)
Subjective:    Patient ID: Heather Rivera, female    DOB: [birth year], 71 y.o.   MRN: [id number]      HPI Shakeya is here for a Physical exam.   She has no concerns.  She continues to have a tremor in both her hands.  Her right hand is worse than the left and most noticeable when she is at work and using the mouse.  It is something that is bothersome.  She does not think at this point she wants to take medication.  She wonders if there is anything natural that may help.  She has never seen anyone for the tremor in the past.  She has no difficulty ambulating and denies any stiffness.  She personally has not seen any signs of Parkinson's disease.   Medications and allergies reviewed with patient and updated if appropriate.  Current Outpatient Medications on File Prior to Visit  Medication Sig Dispense Refill   ASHWAGANDHA PO Take by mouth daily.     fluticasone (FLONASE) 50 MCG/ACT nasal spray Place 2 sprays into both nostrils daily. 16 g 6   loratadine (CLARITIN) 10 MG tablet Take 10 mg by mouth daily.     Magnesium 100 MG CAPS Magnesium     MAGNESIUM PO Take by mouth.     Probiotic Product (PROBIOTIC DAILY PO) Take by mouth.     Pyridoxine HCl (VITAMIN B6 PO) Vitamin B6     Cyanocobalamin (VITAMIN B-12 PO) Vitamin B-12     No current facility-administered medications on file prior to visit.    Review of Systems  Constitutional:  Negative for fever.  Eyes:  Negative for visual disturbance.  Respiratory:  Positive for cough (occ - allergies). Negative for shortness of breath and wheezing.   Cardiovascular:  Negative for chest pain, palpitations and leg swelling.  Gastrointestinal:  Positive for constipation (mild). Negative for abdominal pain, blood in stool, diarrhea and nausea.  Genitourinary:  Negative for dysuria.  Musculoskeletal:  Positive for back pain. Negative for arthralgias.  Skin:  Negative for rash.  Neurological:  Positive for headaches (occ). Negative for  light-headedness.  Psychiatric/Behavioral:  Positive for sleep disturbance. Negative for dysphoric mood. The patient is nervous/anxious (mild, controlled).        Objective:   Vitals:   06/06/22 0856  BP: 120/80  Pulse: 71  Temp: 97.7 F (36.5 C)  SpO2: 95%   Filed Weights   06/06/22 0856  Weight: 168 lb 8 oz (76.4 kg)   Body mass index is 28.04 kg/m.  BP Readings from Last 3 Encounters:  06/06/22 120/80  07/12/21 116/82  10/28/20 124/72    Wt Readings from Last 3 Encounters:  06/06/22 168 lb 8 oz (76.4 kg)  07/12/21 162 lb 6.4 oz (73.7 kg)  10/28/20 188 lb (85.3 kg)       Physical Exam Constitutional: She appears well-developed and well-nourished. No distress.  HENT:  Head: Normocephalic and atraumatic.  Right Ear: External ear normal. Normal ear canal and TM Left Ear: External ear normal.  Normal ear canal and TM Mouth/Throat: Oropharynx is clear and moist.  Eyes: Conjunctivae normal.  Neck: Neck supple. No tracheal deviation present. No thyromegaly present.  No carotid bruit  Cardiovascular: Normal rate, regular rhythm and normal heart sounds.   No murmur heard.  No edema. Pulmonary/Chest: Effort normal and breath sounds normal. No respiratory distress. She has no wheezes. She has no rales.  Breast: deferred   Abdominal: Soft. She exhibits no distension.  There is no tenderness.  Lymphadenopathy: She has no cervical adenopathy.  Skin: Skin is warm and dry. She is not diaphoretic.  Psychiatric: She has a normal mood and affect. Her behavior is normal.     Lab Results  Component Value Date   WBC 6.8 07/12/2021   HGB 13.6 07/12/2021   HCT 40.9 07/12/2021   PLT 318.0 07/12/2021   GLUCOSE 85 07/12/2021   CHOL 293 (H) 07/12/2021   TRIG 71.0 07/12/2021   HDL 117.70 07/12/2021   LDLCALC 162 (H) 07/12/2021   ALT 24 07/12/2021   AST 17 07/12/2021   NA 137 07/12/2021   K 4.0 07/12/2021   CL 99 07/12/2021   CREATININE 0.78 07/12/2021   BUN 13 07/12/2021    CO2 30 07/12/2021   TSH 2.07 07/12/2021   HGBA1C 5.8 07/12/2021         Assessment & Plan:   Physical exam: Screening blood work  ordered Exercise  regular Weight  good for age - on [employer] Substance abuse  none   Reviewed recommended immunizations.   Health Maintenance  Topic Date Due   COVID-19 Vaccine (5 - Moderna series) 06/22/2022 (Originally 05/15/2021)   MAMMOGRAM  06/07/2023 (Originally 03/08/2008)   DEXA SCAN  06/07/2023 (Originally 04/22/2014)   COLONOSCOPY (Pts 45-9yr Insurance coverage will need to be confirmed)  06/07/2023 (Originally 04/22/1994)   TETANUS/TDAP  06/07/2023 (Originally 11/12/2018)   INFLUENZA VACCINE  06/12/2022   Pneumonia Vaccine 73 Years old  Completed   Zoster Vaccines- Shingrix  Completed   HPV VACCINES  Aged Out   Hepatitis C Screening  Discontinued          See Problem List for Assessment and Plan of chronic medical problems.

## 2022-06-06 ENCOUNTER — Other Ambulatory Visit (INDEPENDENT_AMBULATORY_CARE_PROVIDER_SITE_OTHER): Payer: Medicare Other

## 2022-06-06 ENCOUNTER — Ambulatory Visit (INDEPENDENT_AMBULATORY_CARE_PROVIDER_SITE_OTHER): Payer: Medicare Other | Admitting: Internal Medicine

## 2022-06-06 VITALS — BP 120/80 | HR 71 | Temp 97.7°F | Ht 65.0 in | Wt 168.5 lb

## 2022-06-06 DIAGNOSIS — Z0000 Encounter for general adult medical examination without abnormal findings: Secondary | ICD-10-CM

## 2022-06-06 DIAGNOSIS — F5101 Primary insomnia: Secondary | ICD-10-CM

## 2022-06-06 DIAGNOSIS — R7303 Prediabetes: Secondary | ICD-10-CM

## 2022-06-06 DIAGNOSIS — E7849 Other hyperlipidemia: Secondary | ICD-10-CM | POA: Diagnosis not present

## 2022-06-06 DIAGNOSIS — G250 Essential tremor: Secondary | ICD-10-CM | POA: Diagnosis not present

## 2022-06-06 DIAGNOSIS — Z1231 Encounter for screening mammogram for malignant neoplasm of breast: Secondary | ICD-10-CM | POA: Diagnosis not present

## 2022-06-06 DIAGNOSIS — Z13820 Encounter for screening for osteoporosis: Secondary | ICD-10-CM

## 2022-06-06 DIAGNOSIS — J3089 Other allergic rhinitis: Secondary | ICD-10-CM | POA: Diagnosis not present

## 2022-06-06 LAB — CBC WITH DIFFERENTIAL/PLATELET
Basophils Absolute: 0.1 10*3/uL (ref 0.0–0.1)
Basophils Relative: 1.1 % (ref 0.0–3.0)
Eosinophils Absolute: 0.1 10*3/uL (ref 0.0–0.7)
Eosinophils Relative: 1.9 % (ref 0.0–5.0)
HCT: 40.9 % (ref 36.0–46.0)
Hemoglobin: 13.5 g/dL (ref 12.0–15.0)
Lymphocytes Relative: 35.8 % (ref 12.0–46.0)
Lymphs Abs: 1.9 10*3/uL (ref 0.7–4.0)
MCHC: 33 g/dL (ref 30.0–36.0)
MCV: 89.9 fl (ref 78.0–100.0)
Monocytes Absolute: 0.5 10*3/uL (ref 0.1–1.0)
Monocytes Relative: 8.5 % (ref 3.0–12.0)
Neutro Abs: 2.8 10*3/uL (ref 1.4–7.7)
Neutrophils Relative %: 52.7 % (ref 43.0–77.0)
Platelets: 318 10*3/uL (ref 150.0–400.0)
RBC: 4.55 Mil/uL (ref 3.87–5.11)
RDW: 13.2 % (ref 11.5–15.5)
WBC: 5.3 10*3/uL (ref 4.0–10.5)

## 2022-06-06 LAB — COMPREHENSIVE METABOLIC PANEL
ALT: 20 U/L (ref 0–35)
AST: 18 U/L (ref 0–37)
Albumin: 4.2 g/dL (ref 3.5–5.2)
Alkaline Phosphatase: 39 U/L (ref 39–117)
BUN: 15 mg/dL (ref 6–23)
CO2: 31 mEq/L (ref 19–32)
Calcium: 9.8 mg/dL (ref 8.4–10.5)
Chloride: 100 mEq/L (ref 96–112)
Creatinine, Ser: 0.81 mg/dL (ref 0.40–1.20)
GFR: 72.16 mL/min (ref >60.00)
Glucose, Bld: 96 mg/dL (ref 70–99)
Potassium: 3.9 mEq/L (ref 3.5–5.1)
Sodium: 137 mEq/L (ref 135–145)
Total Bilirubin: 0.9 mg/dL (ref 0.2–1.2)
Total Protein: 6.9 g/dL (ref 6.0–8.3)

## 2022-06-06 LAB — LIPID PANEL
Cholesterol: 271 mg/dL — ABNORMAL HIGH (ref 0–200)
HDL: 105.5 mg/dL (ref >39.00)
LDL Cholesterol: 154 mg/dL — ABNORMAL HIGH (ref 0–99)
NonHDL: 165.19
Total CHOL/HDL Ratio: 3
Triglycerides: 57 mg/dL (ref 0.0–149.0)
VLDL: 11.4 mg/dL (ref 0.0–40.0)

## 2022-06-06 LAB — TSH: TSH: 2.15 u[IU]/mL (ref 0.35–5.50)

## 2022-06-06 LAB — HEMOGLOBIN A1C: Hgb A1c MFr Bld: 5.9 % (ref 4.6–6.5)

## 2022-06-06 MED ORDER — ESZOPICLONE 2 MG PO TABS: 2.0000 mg | ORAL_TABLET | Freq: Every evening | ORAL | 0 refills | Status: DC | PRN

## 2022-06-06 MED ORDER — MONTELUKAST SODIUM 10 MG PO TABS: ORAL_TABLET | ORAL | 3 refills | Status: DC

## 2022-06-06 NOTE — Assessment & Plan Note (Signed)
Chronic Check a1c Low sugar / carb diet Stressed regular exercise  

## 2022-06-06 NOTE — Assessment & Plan Note (Signed)
Chronic Regular exercise and healthy diet encouraged Check lipid panel, CMP Currently diet controlled

## 2022-06-06 NOTE — Assessment & Plan Note (Addendum)
Chronic Controlled, Stable Ambien 5 mg nightly as needed-does not take frequently-this typically gives 3 hours of sleep We will try Lunesta 2 mg nightly as needed to see if this helps better.  She typically only takes this under circumstances such as travel or if she does not sleep for several nights in a row

## 2022-06-06 NOTE — Assessment & Plan Note (Addendum)
Chronic Bilateral hands-right hand is worse than left hand Not currently on medication can consider low-dose propranolol, primidone or gabapentin Referral to neurology-to confirm diagnosis and discussed treatment options-at this point she does not think she wants to be on medication

## 2022-06-06 NOTE — Assessment & Plan Note (Signed)
Chronic Controlled Continue singular 10 mg nightly, over-the-counter antihistamine, Flonase daily as needed

## 2022-06-12 ENCOUNTER — Encounter: Payer: Self-pay | Admitting: Neurology

## 2022-07-20 NOTE — Progress Notes (Unsigned)
Assessment/Plan:   ***Essential tremor  -This is evidenced by the symmetrical nature and longstanding hx of gradually getting worse.  We discussed nature and pathophysiology.  We discussed that this can continue to gradually get worse with time.  We discussed that some medications can worsen this, as can caffeine use.  We discussed medication therapy as well as surgical therapy.  The patient really did not want any medications.  She wanted natural herbs/supplements.  Unfortunately, as far as I know, there are none that will help tremor.  We did discuss weighted gloves and a weighted spoons that can certainly help.   Subjective:   Heather Rivera was seen today in the movement disorders clinic for neurologic consultation at the request of Burns, Claudina Lick, MD.  The consultation is for the evaluation of tremor.  Medical records made available to me have been reviewed  Tremor: {yes no:314532}   How long has it been going on? ***  At rest or with activation?  ***Activation  When is it noted the most?  ***When she is at work and using the mouse  Fam hx of tremor?  {yes QI:696295}  Located where?  ***  Affected by caffeine:  {yes no:314532}  Affected by alcohol:  {yes no:314532}  Affected by stress:  {yes no:314532}  Affected by fatigue:  {yes no:314532}  Spills soup if on spoon:  {yes no:314532}  Spills glass of liquid if full:  {yes no:314532}  Affects ADL's (tying shoes, brushing teeth, etc):  {yes no:314532}  Tremor inducing meds:  {yes no:314532}  Other Specific Symptoms:  Voice: *** Sleep: ***  Vivid Dreams:  {yes no:314532}  Acting out dreams:  {yes no:314532} Wet Pillows: {yes no:314532} Postural symptoms:  {yes no:314532}  Falls?  {yes no:314532} Bradykinesia symptoms: {parkinson brady:18041} Loss of smell:  {yes no:314532} Loss of taste:  {yes no:314532} Urinary Incontinence:  {yes no:314532} Difficulty Swallowing:  {yes no:314532} Handwriting, micrographia: {yes  no:314532} Trouble with ADL's:  {yes no:314532}  Trouble buttoning clothing: {yes no:314532} Depression:  {yes no:314532} Memory changes:  {yes no:314532} Hallucinations:  {yes no:314532}  visual distortions: {yes no:314532} N/V:  {yes no:314532} Lightheaded:  {yes no:314532}  Syncope: {yes no:314532} Diplopia:  {yes no:314532} Dyskinesia:  {yes no:314532}  Neuroimaging of the brain has *** previously been performed.  It *** available for my review today.  PREVIOUS MEDICATIONS: {Parkinson's RX:18200}  ALLERGIES:  No Known Allergies  CURRENT MEDICATIONS:  Current Outpatient Medications  Medication Instructions   ASHWAGANDHA PO Oral, Daily   Cyanocobalamin (VITAMIN B-12 PO) Vitamin B-12   eszopiclone (LUNESTA) 2 mg, Oral, At bedtime PRN, Take immediately before bedtime   fluticasone (FLONASE) 50 MCG/ACT nasal spray 2 sprays, Each Nare, Daily   loratadine (CLARITIN) 10 mg, Oral, Daily   Magnesium 100 MG CAPS Magnesium   MAGNESIUM PO Oral   montelukast (SINGULAIR) 10 MG tablet TAKE 1 TABLET BY MOUTH EVERYDAY AT BEDTIME   Probiotic Product (PROBIOTIC DAILY PO) Oral   Pyridoxine HCl (VITAMIN B6 PO) Vitamin B6    Objective:   PHYSICAL EXAMINATION:    VITALS:  There were no vitals filed for this visit.  GEN:  The patient appears stated age and is in NAD. HEENT:  Normocephalic, atraumatic.  The mucous membranes are moist. The superficial temporal arteries are without ropiness or tenderness. CV:  RRR Lungs:  CTAB Neck/HEME:  There are no carotid bruits bilaterally.  Neurological examination:  Orientation: The patient is alert and oriented x3.  Cranial nerves: There is good  facial symmetry.  Extraocular muscles are intact. The visual fields are full to confrontational testing. The speech is fluent and clear. Soft palate rises symmetrically and there is no tongue deviation. Hearing is intact to conversational tone. Sensation: Sensation is intact to light touch throughout  (facial, trunk, extremities). Vibration is intact at the bilateral big toe. There is no extinction with double simultaneous stimulation.  Motor: Strength is 5/5 in the bilateral upper and lower extremities.   Shoulder shrug is equal and symmetric.  There is no pronator drift. Deep tendon reflexes: Deep tendon reflexes are 2/4 at the bilateral biceps, triceps, brachioradialis, patella and achilles. Plantar responses are downgoing bilaterally.  Movement examination: Tone: There is ***tone in the bilateral upper extremities.  The tone in the lower extremities is ***.  Abnormal movements: *** Coordination:  There is *** decremation with RAM's, *** Gait and Station: The patient has *** difficulty arising out of a deep-seated chair without the use of the hands. The patient's stride length is ***.  The patient has a *** pull test.     I have reviewed and interpreted the following labs independently   Chemistry      Component Value Date/Time   NA 137 06/06/2022 0952   K 3.9 06/06/2022 0952   CL 100 06/06/2022 0952   CO2 31 06/06/2022 0952   BUN 15 06/06/2022 0952   CREATININE 0.81 06/06/2022 0952   GLU 91 09/23/2012 0000      Component Value Date/Time   CALCIUM 9.8 06/06/2022 0952   ALKPHOS 39 06/06/2022 0952   AST 18 06/06/2022 0952   ALT 20 06/06/2022 0952   BILITOT 0.9 06/06/2022 0952      Lab Results  Component Value Date   TSH 2.15 06/06/2022   Lab Results  Component Value Date   WBC 5.3 06/06/2022   HGB 13.5 06/06/2022   HCT 40.9 06/06/2022   MCV 89.9 06/06/2022   PLT 318.0 06/06/2022     Total time spent on today's visit was ***greater than 60 minutes, including both face-to-face time and nonface-to-face time.  Time included that spent on review of records (prior notes available to me/labs/imaging if pertinent), discussing treatment and goals, answering patient's questions and coordinating care.  Cc:  Binnie Rail, MD

## 2022-07-24 ENCOUNTER — Ambulatory Visit: Payer: Medicare Other | Admitting: Neurology

## 2022-07-24 ENCOUNTER — Encounter: Payer: Self-pay | Admitting: Neurology

## 2022-07-24 VITALS — BP 120/70 | HR 76 | Ht 66.0 in | Wt 166.8 lb

## 2022-07-24 DIAGNOSIS — G250 Essential tremor: Secondary | ICD-10-CM

## 2022-07-24 NOTE — Patient Instructions (Signed)
It was good to see you today!  We discussed medications, weighted spoons, forks, gloves.  We discussed that you do not have Parkinsons Disease today.  The physicians and staff at [facility] are committed to providing excellent care. You may receive a survey requesting feedback about your experience at our office. We strive to receive "very good" responses to the survey questions. If you feel that your experience would prevent you from giving the office a "very good " response, please contact our office to try to remedy the situation. We may be reached at [phone]. Thank you for taking the time out of your busy day to complete the survey.   Essential Tremor A tremor is trembling or shaking that a person cannot control. Most tremors affect the hands or arms. Tremors can also affect the head, vocal cords, legs, and other parts of the body. Essential tremor is a tremor without a known cause. Usually, it occurs while a person is trying to perform an action. It tends to get worse gradually as a person ages. What are the causes? The cause of this condition is not known, but it often runs in families. What increases the risk? You are more likely to develop this condition if: You have a family member with essential tremor. You are 8 years of age or older. What are the signs or symptoms? The main sign of a tremor is a rhythmic shaking of certain parts of your body that is uncontrolled and unintentional. You may: Have difficulty eating with a spoon or fork. Have difficulty writing. Nod your head up and down or side to side. Have a quivering voice. The shaking may: Get worse over time. Come and go. Be more noticeable on one side of your body. Get worse due to stress, tiredness (fatigue), caffeine, and extreme heat or cold. How is this diagnosed? This condition may be diagnosed based on: Your symptoms and medical history. A physical exam. There is no single test to diagnose an essential  tremor. However, your health care provider may order tests to rule out other causes of your condition. These may include: Blood and urine tests. Imaging studies of your brain, such as a CT scan or MRI. How is this treated? Treatment for essential tremor depends on the severity of the condition. Mild tremors may not need treatment if they do not affect your day-to-day life. Severe tremors may need to be treated using one or more of the following options: Medicines. Injections of a substance called botulinum toxin. Procedures such as deep brain stimulation (DBS) implantation or MRI-guided ultrasound treatment. Lifestyle changes. Occupational or physical therapy. Follow these instructions at home: Lifestyle  Do not use any products that contain nicotine or tobacco. These products include cigarettes, chewing tobacco, and vaping devices, such as e-cigarettes. If you need help quitting, ask your health care provider. Limit your caffeine intake as told by your health care provider. Try to get 8 hours of sleep each night. Find ways to manage your stress that fit your lifestyle and personality. Consider trying meditation or yoga. Try to anticipate stressful situations and allow extra time to manage them. If you are struggling emotionally with the effects of your tremor, consider working with a mental health provider. General instructions Take over-the-counter and prescription medicines only as told by your health care provider. Avoid extreme heat and extreme cold. Keep all follow-up visits. This is important. Visits may include physical therapy visits. Where to find more information [employer] of Neurological Disorders and  Stroke: [email or web address] Contact a health care provider if: You experience any changes in the location or intensity of your tremors. You start having a tremor after starting a new medicine. You have a tremor with other symptoms, such  as: Numbness. Tingling. Pain. Weakness. Your tremor gets worse. Your tremor interferes with your daily life. You feel down, blue, or sad for at least 2 weeks in a row. Worrying about your tremor and what other people think about you interferes with your everyday life functions, including relationships, work, or school. Summary Essential tremor is a tremor without a known cause. Usually, it occurs when you are trying to perform an action. You are more likely to develop this condition if you have a family member with essential tremor. The main sign of a tremor is a rhythmic shaking of certain parts of your body that is uncontrolled and unintentional. Treatment for essential tremor depends on the severity of the condition. This information is not intended to replace advice given to you by your health care provider. Make sure you discuss any questions you have with your health care provider. Document Revised: 08/18/2021 Document Reviewed: 08/18/2021 Elsevier Patient Education  [place].

## 2022-08-10 ENCOUNTER — Telehealth: Payer: Self-pay

## 2022-08-10 NOTE — Telephone Encounter (Signed)
LVM asking if patient has scheduled mammogram. If not where would patient like to go, and if she has had one where was it done.

## 2022-08-29 ENCOUNTER — Telehealth: Payer: Self-pay | Admitting: Internal Medicine

## 2022-08-29 NOTE — Telephone Encounter (Signed)
Left message for patient to call back and schedule Medicare Annual Wellness Visit (AWV).   Please offer to do virtually or by telephone.  Left office number and my jabber [phone].  Last AWV:06/05/2018  Please schedule at anytime with Nurse Health Advisor.

## 2022-12-05 ENCOUNTER — Ambulatory Visit
Admission: RE | Admit: 2022-12-05 | Discharge: 2022-12-05 | Disposition: A | Discharge status: Home / Self Care | Payer: PPO | Source: Ambulatory Visit | Attending: Internal Medicine | Admitting: Internal Medicine

## 2022-12-05 DIAGNOSIS — Z1231 Encounter for screening mammogram for malignant neoplasm of breast: Secondary | ICD-10-CM | POA: Diagnosis not present

## 2022-12-05 DIAGNOSIS — Z13820 Encounter for screening for osteoporosis: Secondary | ICD-10-CM

## 2022-12-05 DIAGNOSIS — Z780 Asymptomatic menopausal state: Secondary | ICD-10-CM | POA: Diagnosis not present

## 2022-12-05 DIAGNOSIS — M8589 Other specified disorders of bone density and structure, multiple sites: Secondary | ICD-10-CM | POA: Diagnosis not present

## 2022-12-07 ENCOUNTER — Other Ambulatory Visit: Payer: Self-pay | Admitting: Internal Medicine

## 2022-12-07 DIAGNOSIS — R928 Other abnormal and inconclusive findings on diagnostic imaging of breast: Secondary | ICD-10-CM

## 2022-12-09 ENCOUNTER — Encounter: Payer: Self-pay | Admitting: Internal Medicine

## 2022-12-09 DIAGNOSIS — M8580 Other specified disorders of bone density and structure, unspecified site: Secondary | ICD-10-CM | POA: Insufficient documentation

## 2022-12-09 DIAGNOSIS — M810 Age-related osteoporosis without current pathological fracture: Secondary | ICD-10-CM | POA: Insufficient documentation

## 2022-12-10 ENCOUNTER — Telehealth: Payer: Self-pay | Admitting: Internal Medicine

## 2022-12-10 MED ORDER — BENZONATATE 200 MG PO CAPS: 200.0000 mg | ORAL_CAPSULE | Freq: Three times a day (TID) | ORAL | 0 refills | Status: DC | PRN

## 2022-12-10 MED ORDER — PROMETHAZINE-DM 6.25-15 MG/5ML PO SYRP: 5.0000 mL | ORAL_SOLUTION | Freq: Four times a day (QID) | ORAL | 0 refills | Status: DC | PRN

## 2022-12-10 NOTE — Telephone Encounter (Signed)
Syrup and pills sent

## 2022-12-10 NOTE — Telephone Encounter (Signed)
Patient complaint of post-covid cough that is persistent. She is wanting cough medicine until her appointment on 12/17/22 with pcp.

## 2022-12-11 DIAGNOSIS — D225 Melanocytic nevi of trunk: Secondary | ICD-10-CM | POA: Diagnosis not present

## 2022-12-11 DIAGNOSIS — D2261 Melanocytic nevi of right upper limb, including shoulder: Secondary | ICD-10-CM | POA: Diagnosis not present

## 2022-12-11 DIAGNOSIS — D485 Neoplasm of uncertain behavior of skin: Secondary | ICD-10-CM | POA: Diagnosis not present

## 2022-12-11 DIAGNOSIS — D0359 Melanoma in situ of other part of trunk: Secondary | ICD-10-CM | POA: Diagnosis not present

## 2022-12-11 DIAGNOSIS — D1801 Hemangioma of skin and subcutaneous tissue: Secondary | ICD-10-CM | POA: Diagnosis not present

## 2022-12-11 DIAGNOSIS — L814 Other melanin hyperpigmentation: Secondary | ICD-10-CM | POA: Diagnosis not present

## 2022-12-11 DIAGNOSIS — L821 Other seborrheic keratosis: Secondary | ICD-10-CM | POA: Diagnosis not present

## 2022-12-11 DIAGNOSIS — B079 Viral wart, unspecified: Secondary | ICD-10-CM | POA: Diagnosis not present

## 2022-12-11 DIAGNOSIS — Z85828 Personal history of other malignant neoplasm of skin: Secondary | ICD-10-CM | POA: Diagnosis not present

## 2022-12-12 ENCOUNTER — Ambulatory Visit
Admission: RE | Admit: 2022-12-12 | Discharge: 2022-12-12 | Disposition: A | Discharge status: Home / Self Care | Payer: PPO | Source: Ambulatory Visit | Attending: Internal Medicine | Admitting: Internal Medicine

## 2022-12-12 ENCOUNTER — Other Ambulatory Visit: Payer: Self-pay | Admitting: Internal Medicine

## 2022-12-12 DIAGNOSIS — R928 Other abnormal and inconclusive findings on diagnostic imaging of breast: Secondary | ICD-10-CM

## 2022-12-12 DIAGNOSIS — R921 Mammographic calcification found on diagnostic imaging of breast: Secondary | ICD-10-CM | POA: Diagnosis not present

## 2022-12-16 ENCOUNTER — Encounter: Payer: Self-pay | Admitting: Internal Medicine

## 2022-12-16 NOTE — Progress Notes (Unsigned)
    Subjective:    Patient ID: Heather Rivera, female    DOB: [birth year], 72 y.o.   MRN: [id number]      HPI Heather Rivera is here for No chief complaint on file.   Had covid 3 weeks ago - has lingering cough.      Medications and allergies reviewed with patient and updated if appropriate.  Current Outpatient Medications on File Prior to Visit  Medication Sig Dispense Refill   ASHWAGANDHA PO Take by mouth daily.     benzonatate (TESSALON) 200 MG capsule Take 1 capsule (200 mg total) by mouth 3 (three) times daily as needed for cough. 30 capsule 0   Cyanocobalamin (VITAMIN B-12 PO) Vitamin B-12     eszopiclone (LUNESTA) 2 MG TABS tablet Take 1 tablet (2 mg total) by mouth at bedtime as needed for sleep. Take immediately before bedtime (Patient not taking: Reported on 07/24/2022) 30 tablet 0   fluticasone (FLONASE) 50 MCG/ACT nasal spray Place 2 sprays into both nostrils daily. 16 g 6   loratadine (CLARITIN) 10 MG tablet Take 10 mg by mouth daily.     Magnesium 100 MG CAPS 400 mg.     montelukast (SINGULAIR) 10 MG tablet TAKE 1 TABLET BY MOUTH EVERYDAY AT BEDTIME 90 tablet 3   Probiotic Product (PROBIOTIC DAILY PO) Take by mouth.     promethazine-dextromethorphan (PROMETHAZINE-DM) 6.25-15 MG/5ML syrup Take 5 mLs by mouth 4 (four) times daily as needed for cough. 150 mL 0   Pyridoxine HCl (VITAMIN B6 PO) Vitamin B6     No current facility-administered medications on file prior to visit.    Review of Systems     Objective:  There were no vitals filed for this visit. BP Readings from Last 3 Encounters:  07/24/22 120/70  06/06/22 120/80  07/12/21 116/82   Wt Readings from Last 3 Encounters:  07/24/22 166 lb 12.8 oz (75.7 kg)  06/06/22 168 lb 8 oz (76.4 kg)  07/12/21 162 lb 6.4 oz (73.7 kg)   There is no height or weight on file to calculate BMI.    Physical Exam         Assessment & Plan:    See Problem List for Assessment and Plan of chronic medical problems.

## 2022-12-17 ENCOUNTER — Ambulatory Visit (INDEPENDENT_AMBULATORY_CARE_PROVIDER_SITE_OTHER): Payer: PPO | Admitting: Internal Medicine

## 2022-12-17 VITALS — BP 124/70 | HR 75 | Temp 98.1°F | Ht 66.0 in | Wt 173.2 lb

## 2022-12-17 DIAGNOSIS — J0100 Acute maxillary sinusitis, unspecified: Secondary | ICD-10-CM | POA: Diagnosis not present

## 2022-12-17 DIAGNOSIS — R053 Chronic cough: Secondary | ICD-10-CM | POA: Diagnosis not present

## 2022-12-17 MED ORDER — AMOXICILLIN-POT CLAVULANATE 875-125 MG PO TABS: 1.0000 | ORAL_TABLET | Freq: Two times a day (BID) | ORAL | 0 refills | Status: DC

## 2022-12-17 MED ORDER — PREDNISONE 20 MG PO TABS: 40.0000 mg | ORAL_TABLET | Freq: Every day | ORAL | 0 refills | Status: AC

## 2022-12-17 NOTE — Patient Instructions (Addendum)
       Medications changes include :   Augmentin twice daily x 7 days, prednisone 40 mg x 3 days     Return if symptoms worsen or fail to improve.

## 2022-12-17 NOTE — Assessment & Plan Note (Signed)
Persisting cough since COVID and maybe even before that Possibly related to sinus infection, PND Reactive airway disease Will be treating sinus infection with Augmentin Prednisone 40 mg daily x 3 days-will repeat course if needed Symptomatic treatment Call if no improvement

## 2022-12-17 NOTE — Assessment & Plan Note (Signed)
Subacute Symptoms consistent with possible sinus infection in left maxillary sinus Has had symptoms for a while-has pressure, thicker yellow mucus and occasional blood Likely causing PND and cough Start Augmentin 875-125 mg twice daily x 7 days Continue symptomatic treatment

## 2022-12-18 ENCOUNTER — Telehealth: Payer: Self-pay | Admitting: Internal Medicine

## 2022-12-18 MED ORDER — AZITHROMYCIN 250 MG PO TABS: ORAL_TABLET | ORAL | 0 refills | Status: DC

## 2022-12-18 NOTE — Telephone Encounter (Signed)
Message left for patient today with info and Zpac sent in.

## 2022-12-18 NOTE — Telephone Encounter (Signed)
Stop augmentin.  Can take claritin if she is not already taking it and benadryl at night if needed.    Lets try a zpak to see if that works. It is pending.

## 2022-12-18 NOTE — Telephone Encounter (Signed)
Patient called and said she is having a reaction to amoxicillin-clavulanate (AUGMENTIN) 875-125 MG tablet.  She said she threw up last night and that she has a rash on her face. Best callback number is [phone]

## 2022-12-19 ENCOUNTER — Encounter: Payer: Self-pay | Admitting: Internal Medicine

## 2022-12-19 DIAGNOSIS — Z85820 Personal history of malignant melanoma of skin: Secondary | ICD-10-CM | POA: Insufficient documentation

## 2022-12-19 DIAGNOSIS — C439 Malignant melanoma of skin, unspecified: Secondary | ICD-10-CM | POA: Insufficient documentation

## 2022-12-20 ENCOUNTER — Ambulatory Visit
Admission: RE | Admit: 2022-12-20 | Discharge: 2022-12-20 | Disposition: A | Discharge status: Home / Self Care | Payer: PPO | Source: Ambulatory Visit | Attending: Internal Medicine | Admitting: Internal Medicine

## 2022-12-20 DIAGNOSIS — N6032 Fibrosclerosis of left breast: Secondary | ICD-10-CM | POA: Diagnosis not present

## 2022-12-20 DIAGNOSIS — R928 Other abnormal and inconclusive findings on diagnostic imaging of breast: Secondary | ICD-10-CM

## 2022-12-20 DIAGNOSIS — R921 Mammographic calcification found on diagnostic imaging of breast: Secondary | ICD-10-CM

## 2022-12-20 HISTORY — PX: BREAST BIOPSY: SHX20

## 2023-01-03 ENCOUNTER — Encounter: Payer: Self-pay | Admitting: Internal Medicine

## 2023-01-03 ENCOUNTER — Other Ambulatory Visit: Payer: Self-pay | Admitting: Internal Medicine

## 2023-01-03 MED ORDER — ESZOPICLONE 2 MG PO TABS: 2.0000 mg | ORAL_TABLET | Freq: Every evening | ORAL | 2 refills | Status: DC | PRN

## 2023-01-03 NOTE — Addendum Note (Signed)
Addended by: Binnie Rail on: 01/03/2023 05:13 PM   Modules accepted: Orders

## 2023-01-22 DIAGNOSIS — Z85828 Personal history of other malignant neoplasm of skin: Secondary | ICD-10-CM | POA: Diagnosis not present

## 2023-01-22 DIAGNOSIS — L988 Other specified disorders of the skin and subcutaneous tissue: Secondary | ICD-10-CM | POA: Diagnosis not present

## 2023-01-22 DIAGNOSIS — D0359 Melanoma in situ of other part of trunk: Secondary | ICD-10-CM | POA: Diagnosis not present

## 2023-01-22 DIAGNOSIS — D485 Neoplasm of uncertain behavior of skin: Secondary | ICD-10-CM | POA: Diagnosis not present

## 2023-05-06 ENCOUNTER — Ambulatory Visit (INDEPENDENT_AMBULATORY_CARE_PROVIDER_SITE_OTHER): Payer: PPO | Admitting: Internal Medicine

## 2023-05-06 ENCOUNTER — Encounter: Payer: Self-pay | Admitting: Internal Medicine

## 2023-05-06 VITALS — BP 124/84 | HR 74 | Temp 97.9°F | Ht 66.0 in | Wt 165.0 lb

## 2023-05-06 DIAGNOSIS — L729 Follicular cyst of the skin and subcutaneous tissue, unspecified: Secondary | ICD-10-CM

## 2023-05-06 DIAGNOSIS — L089 Local infection of the skin and subcutaneous tissue, unspecified: Secondary | ICD-10-CM | POA: Insufficient documentation

## 2023-05-06 MED ORDER — AZITHROMYCIN 250 MG PO TABS: ORAL_TABLET | ORAL | 0 refills | Status: DC

## 2023-05-06 NOTE — Patient Instructions (Addendum)
     Medications changes include :   zpak    Return if symptoms worsen or fail to improve.  

## 2023-05-06 NOTE — Progress Notes (Signed)
    Subjective:    Patient ID: Heather Rivera, female    DOB: [birth year], 72 y.o.   MRN: [id number]      HPI Heather Rivera is here for No chief complaint on file.    She developed a boil on her left proximal medial thigh approximately 1 month ago.  She has been doing warm compresses and feels that it has helped, but it has not gone away.  It is painful.  She denies any fevers or chills.  She has not had any discharge.    Medications and allergies reviewed with patient and updated if appropriate.  Current Outpatient Medications on File Prior to Visit  Medication Sig Dispense Refill   Cyanocobalamin (VITAMIN B-12 PO) Vitamin B-12     fluticasone (FLONASE) 50 MCG/ACT nasal spray Place 2 sprays into both nostrils daily. 16 g 6   loratadine (CLARITIN) 10 MG tablet Take 10 mg by mouth daily.     Magnesium 100 MG CAPS 400 mg.     montelukast (SINGULAIR) 10 MG tablet TAKE 1 TABLET BY MOUTH EVERYDAY AT BEDTIME 90 tablet 3   Probiotic Product (PROBIOTIC DAILY PO) Take by mouth.     Pyridoxine HCl (VITAMIN B6 PO) Vitamin B6     VITAMIN D, CHOLECALCIFEROL, PO Take by mouth.     ASHWAGANDHA PO Take by mouth daily.     azithromycin (ZITHROMAX) 250 MG tablet Take two tabs the first day and then one tab daily for four days (Patient not taking: Reported on 05/06/2023) 6 tablet 0   eszopiclone (LUNESTA) 2 MG TABS tablet Take 1 tablet (2 mg total) by mouth at bedtime as needed for sleep. Take immediately before bedtime (Patient not taking: Reported on 05/06/2023) 30 tablet 2   No current facility-administered medications on file prior to visit.    Review of Systems     Objective:   Vitals:   05/06/23 1023  BP: 124/84  Pulse: 74  Temp: 97.9 F (36.6 C)  SpO2: 98%   BP Readings from Last 3 Encounters:  05/06/23 124/84  12/17/22 124/70  07/24/22 120/70   Wt Readings from Last 3 Encounters:  05/06/23 165 lb (74.8 kg)  12/17/22 173 lb 3.2 oz (78.6 kg)  07/24/22 166 lb 12.8 oz (75.7 kg)    Body mass index is 26.63 kg/m.    Physical Exam Constitutional:      General: She is not in acute distress.    Appearance: Normal appearance. She is not ill-appearing.  HENT:     Head: Normocephalic and atraumatic.  Skin:    General: Skin is warm and dry.     Findings: Lesion (Infected boil left proximal medial thigh-slightly tender, saw soft without obvious fluctuance, mild erythema, no induration.  No surrounding swelling, fluctuance, induration or erythema) present.  Neurological:     Mental Status: She is alert.            Assessment & Plan:    See Problem List for Assessment and Plan of chronic medical problems.

## 2023-05-06 NOTE — Assessment & Plan Note (Signed)
Acute Left proximal medial thigh infected boil/skin cyst Has been there for a month and has improved with warm compresses, but has not resolved No drainage, but still tender Z-Pak Continue warm compresses Call if no improvement

## 2023-05-10 ENCOUNTER — Telehealth: Payer: Self-pay | Admitting: Internal Medicine

## 2023-05-10 MED ORDER — DOXYCYCLINE HYCLATE 100 MG PO TABS: 100.0000 mg | ORAL_TABLET | Freq: Two times a day (BID) | ORAL | 0 refills | Status: DC

## 2023-05-10 NOTE — Telephone Encounter (Signed)
Doxycycline sent to pharmacy - she should call if she has any side effects

## 2023-05-10 NOTE — Telephone Encounter (Signed)
Called pt no answer LMOM w/MD response../lmb 

## 2023-05-10 NOTE — Telephone Encounter (Signed)
Patient was seen 05/06/23 for a boil. Patient said she dropped her last pill of azithromycin (ZITHROMAX) 250 MG tablet and can't find it; she has taken the rest of the pack. She also said it does not seem to be going away and wanted to know if Dr. Lawerance Bach would recommend prescribing more. Patient would like a call back at [phone].

## 2023-05-17 ENCOUNTER — Other Ambulatory Visit: Payer: Self-pay | Admitting: Internal Medicine

## 2023-05-17 ENCOUNTER — Encounter: Payer: Self-pay | Admitting: Internal Medicine

## 2023-05-27 DIAGNOSIS — L02426 Furuncle of left lower limb: Secondary | ICD-10-CM | POA: Diagnosis not present

## 2023-05-27 DIAGNOSIS — Z85828 Personal history of other malignant neoplasm of skin: Secondary | ICD-10-CM | POA: Diagnosis not present

## 2023-06-17 ENCOUNTER — Encounter: Payer: Self-pay | Admitting: Internal Medicine

## 2023-06-17 NOTE — Patient Instructions (Addendum)
Blood work was ordered.   The lab is on the first floor.    Medications changes include :   propranolol 20 mg twice  a day as needed     Return in about 1 year (around 06/17/2024) for Physical Exam.    Health Maintenance, Female Adopting a healthy lifestyle and getting preventive care are important in promoting health and wellness. Ask your health care provider about: The right schedule for you to have regular tests and exams. Things you can do on your own to prevent diseases and keep yourself healthy. What should I know about diet, weight, and exercise? Eat a healthy diet  Eat a diet that includes plenty of vegetables, fruits, low-fat dairy products, and lean protein. Do not eat a lot of foods that are high in solid fats, added sugars, or sodium. Maintain a healthy weight Body mass index (BMI) is used to identify weight problems. It estimates body fat based on height and weight. Your health care provider can help determine your BMI and help you achieve or maintain a healthy weight. Get regular exercise Get regular exercise. This is one of the most important things you can do for your health. Most adults should: Exercise for at least 150 minutes each week. The exercise should increase your heart rate and make you sweat (moderate-intensity exercise). Do strengthening exercises at least twice a week. This is in addition to the moderate-intensity exercise. Spend less time sitting. Even light physical activity can be beneficial. Watch cholesterol and blood lipids Have your blood tested for lipids and cholesterol at 74 years of age, then have this test every 5 years. Have your cholesterol levels checked more often if: Your lipid or cholesterol levels are high. You are older than 74 years of age. You are at high risk for heart disease. What should I know about cancer screening? Depending on your health history and family history, you may need to have cancer screening at various  ages. This may include screening for: Breast cancer. Cervical cancer. Colorectal cancer. Skin cancer. Lung cancer. What should I know about heart disease, diabetes, and high blood pressure? Blood pressure and heart disease High blood pressure causes heart disease and increases the risk of stroke. This is more likely to develop in people who have high blood pressure readings or are overweight. Have your blood pressure checked: Every 3-5 years if you are 68-76 years of age. Every year if you are 102 years old or older. Diabetes Have regular diabetes screenings. This checks your fasting blood sugar level. Have the screening done: Once every three years after age 52 if you are at a normal weight and have a low risk for diabetes. More often and at a younger age if you are overweight or have a high risk for diabetes. What should I know about preventing infection? Hepatitis B If you have a higher risk for hepatitis B, you should be screened for this virus. Talk with your health care provider to find out if you are at risk for hepatitis B infection. Hepatitis C Testing is recommended for: Everyone born from [birth year] through 1967. Anyone with known risk factors for hepatitis C. Sexually transmitted infections (STIs) Get screened for STIs, including gonorrhea and chlamydia, if: You are sexually active and are younger than 74 years of age. You are older than 74 years of age and your health care provider tells you that you are at risk for this type of infection. Your sexual activity has changed  since you were last screened, and you are at increased risk for chlamydia or gonorrhea. Ask your health care provider if you are at risk. Ask your health care provider about whether you are at high risk for HIV. Your health care provider may recommend a prescription medicine to help prevent HIV infection. If you choose to take medicine to prevent HIV, you should first get tested for HIV. You should then be tested  every 3 months for as long as you are taking the medicine. Pregnancy If you are about to stop having your period (premenopausal) and you may become pregnant, seek counseling before you get pregnant. Take 400 to 800 micrograms (mcg) of folic acid every day if you become pregnant. Ask for birth control (contraception) if you want to prevent pregnancy. Osteoporosis and menopause Osteoporosis is a disease in which the bones lose minerals and strength with aging. This can result in bone fractures. If you are 41 years old or older, or if you are at risk for osteoporosis and fractures, ask your health care provider if you should: Be screened for bone loss. Take a calcium or vitamin D supplement to lower your risk of fractures. Be given hormone replacement therapy (HRT) to treat symptoms of menopause. Follow these instructions at home: Alcohol use Do not drink alcohol if: Your health care provider tells you not to drink. You are pregnant, may be pregnant, or are planning to become pregnant. If you drink alcohol: Limit how much you have to: 0-1 drink a day. Know how much alcohol is in your drink. In the [place] one drink equals one 12 oz bottle of beer (355 mL), one 5 oz glass of wine (148 mL), or one 1 oz glass of hard liquor (44 mL). Lifestyle Do not use any products that contain nicotine or tobacco. These products include cigarettes, chewing tobacco, and vaping devices, such as e-cigarettes. If you need help quitting, ask your health care provider. Do not use street drugs. Do not share needles. Ask your health care provider for help if you need support or information about quitting drugs. General instructions Schedule regular health, dental, and eye exams. Stay current with your vaccines. Tell your health care provider if: You often feel depressed. You have ever been abused or do not feel safe at home. Summary Adopting a healthy lifestyle and getting preventive care are important in promoting  health and wellness. Follow your health care provider's instructions about healthy diet, exercising, and getting tested or screened for diseases. Follow your health care provider's instructions on monitoring your cholesterol and blood pressure. This information is not intended to replace advice given to you by your health care provider. Make sure you discuss any questions you have with your health care provider. Document Revised: 03/20/2021 Document Reviewed: 03/20/2021 Elsevier Patient Education  2024 [employer].

## 2023-06-17 NOTE — Progress Notes (Signed)
Subjective:    Patient ID: Heather Rivera, female    DOB: 1951/08/18, 72 y.o.   MRN: [id number]      HPI Princie is here for a Physical exam and her chronic medical problems.    Getting a little dizzy at times.  Occurs when she looks down and then looks up.  She can tell that something is different.   Thought it may be related to BP but also has a lot of allergies.   Medications and allergies reviewed with patient and updated if appropriate.  Current Outpatient Medications on File Prior to Visit  Medication Sig Dispense Refill   ASHWAGANDHA PO Take by mouth daily.     Cyanocobalamin (VITAMIN B-12 PO) Vitamin B-12     fluticasone (FLONASE) 50 MCG/ACT nasal spray Place 2 sprays into both nostrils daily. 16 g 6   loratadine (CLARITIN) 10 MG tablet Take 10 mg by mouth daily.     Magnesium 100 MG CAPS 400 mg.     montelukast (SINGULAIR) 10 MG tablet TAKE 1 TABLET BY MOUTH EVERYDAY AT BEDTIME 90 tablet 3   Probiotic Product (PROBIOTIC DAILY PO) Take by mouth.     VITAMIN D, CHOLECALCIFEROL, PO Take by mouth.     No current facility-administered medications on file prior to visit.    Review of Systems  Constitutional:  Negative for fever.  HENT:  Positive for trouble swallowing (once in a while).   Eyes:  Negative for visual disturbance.  Respiratory:  Positive for cough (allergies). Negative for shortness of breath and wheezing.   Cardiovascular:  Negative for chest pain, palpitations and leg swelling.  Gastrointestinal:  Negative for abdominal pain, blood in stool, constipation and diarrhea.       No gerd  Genitourinary:  Negative for dysuria.  Musculoskeletal:  Positive for arthralgias (mild). Negative for back pain.       Leg cramps at night  Skin:  Negative for rash.  Neurological:  Positive for dizziness (mild with changes in position). Negative for light-headedness and headaches.  Psychiatric/Behavioral:  Negative for dysphoric mood. The patient is nervous/anxious  (situational).        Objective:   Vitals:   06/18/23 0841  BP: 136/80  Pulse: 73  Temp: 98.2 F (36.8 C)  SpO2: 98%   Filed Weights   06/18/23 0841  Weight: 170 lb (77.1 kg)   Body mass index is 27.44 kg/m.  BP Readings from Last 3 Encounters:  06/18/23 136/80  05/06/23 124/84  12/17/22 124/70    Wt Readings from Last 3 Encounters:  06/18/23 170 lb (77.1 kg)  05/06/23 165 lb (74.8 kg)  12/17/22 173 lb 3.2 oz (78.6 kg)       Physical Exam Constitutional: She appears well-developed and well-nourished. No distress.  HENT:  Head: Normocephalic and atraumatic.  Right Ear: External ear normal. Normal ear canal and TM Left Ear: External ear normal.  Normal ear canal and TM Mouth/Throat: Oropharynx is clear and moist.  Eyes: Conjunctivae normal.  Neck: Neck supple. No tracheal deviation present. No thyromegaly present.  No carotid bruit  Cardiovascular: Normal rate, regular rhythm and normal heart sounds.   No murmur heard.  No edema. Pulmonary/Chest: Effort normal and breath sounds normal. No respiratory distress. She has no wheezes. She has no rales.  Breast: deferred   Abdominal: Soft. She exhibits no distension. There is no tenderness.  Lymphadenopathy: She has no cervical adenopathy.  Skin: Skin is warm and dry. She is not diaphoretic.  Psychiatric: She has a normal mood and affect. Her behavior is normal.     Lab Results  Component Value Date   WBC 5.3 06/06/2022   HGB 13.5 06/06/2022   HCT 40.9 06/06/2022   PLT 318.0 06/06/2022   GLUCOSE 96 06/06/2022   CHOL 271 (H) 06/06/2022   TRIG 57.0 06/06/2022   HDL 105.50 06/06/2022   LDLCALC 154 (H) 06/06/2022   ALT 20 06/06/2022   AST 18 06/06/2022   NA 137 06/06/2022   K 3.9 06/06/2022   CL 100 06/06/2022   CREATININE 0.81 06/06/2022   BUN 15 06/06/2022   CO2 31 06/06/2022   TSH 2.15 06/06/2022   HGBA1C 5.9 06/06/2022         Assessment & Plan:   Physical exam: Screening blood work   ordered Exercise  trainer 1/week, gym regularly Weight  is good Substance abuse  none   Reviewed recommended immunizations.   Health Maintenance  Topic Date Due   DTaP/Tdap/Td (2 - Tdap) 11/12/2018   Medicare Annual Wellness (AWV)  06/06/2019   COVID-19 Vaccine (6 - 2023-24 season) 10/14/2022   INFLUENZA VACCINE  02/10/2024 (Originally 06/13/2023)   Colonoscopy  06/17/2024 (Originally 04/22/1994)   DEXA SCAN  12/05/2024   MAMMOGRAM  12/12/2024   Pneumonia Vaccine 75+ Years old  Completed   Zoster Vaccines- Shingrix  Completed   HPV VACCINES  Aged Out   Hepatitis C Screening  Discontinued          See Problem List for Assessment and Plan of chronic medical problems.

## 2023-06-18 ENCOUNTER — Ambulatory Visit: Payer: PPO | Admitting: Internal Medicine

## 2023-06-18 VITALS — BP 136/80 | HR 73 | Temp 98.2°F | Ht 66.0 in | Wt 170.0 lb

## 2023-06-18 DIAGNOSIS — M8589 Other specified disorders of bone density and structure, multiple sites: Secondary | ICD-10-CM | POA: Diagnosis not present

## 2023-06-18 DIAGNOSIS — G250 Essential tremor: Secondary | ICD-10-CM

## 2023-06-18 DIAGNOSIS — Z0000 Encounter for general adult medical examination without abnormal findings: Secondary | ICD-10-CM | POA: Diagnosis not present

## 2023-06-18 DIAGNOSIS — R7303 Prediabetes: Secondary | ICD-10-CM | POA: Diagnosis not present

## 2023-06-18 DIAGNOSIS — E7849 Other hyperlipidemia: Secondary | ICD-10-CM

## 2023-06-18 MED ORDER — PROPRANOLOL HCL 20 MG PO TABS: 20.0000 mg | ORAL_TABLET | Freq: Two times a day (BID) | ORAL | 5 refills | Status: DC | PRN

## 2023-06-18 NOTE — Assessment & Plan Note (Addendum)
Chronic Regular exercise and healthy diet encouraged Check lipid panel, CMP, TSH, CBC Currently diet controlled

## 2023-06-18 NOTE — Assessment & Plan Note (Addendum)
Chronic Lab Results  Component Value Date   HGBA1C 5.9 06/06/2022   Check a1c, CMP, CBC Low sugar / carb diet Stressed regular exercise

## 2023-06-18 NOTE — Assessment & Plan Note (Addendum)
Chronic Check TSH, CBC Bilateral hands-right hand is worse than left hand Not currently on medication Has seen neurology 07/2022 Would like to try propranolol -- start propranolol 20 mg bid prn  - can titrate

## 2023-06-18 NOTE — Assessment & Plan Note (Addendum)
Chronic DEXA up-to-date Continue regular exercise Continue vitamin D supplementation Does not tolerate calcium supplementation due to constipation-encouraged high calcium diet

## 2023-06-19 ENCOUNTER — Encounter: Payer: Self-pay | Admitting: Internal Medicine

## 2023-06-19 DIAGNOSIS — E7800 Pure hypercholesterolemia, unspecified: Secondary | ICD-10-CM

## 2023-06-19 LAB — COMPREHENSIVE METABOLIC PANEL
ALT: 19 U/L (ref 0–35)
AST: 17 U/L (ref 0–37)
Albumin: 4.3 g/dL (ref 3.5–5.2)
Alkaline Phosphatase: 41 U/L (ref 39–117)
BUN: 19 mg/dL (ref 6–23)
CO2: 30 mEq/L (ref 19–32)
Calcium: 10.1 mg/dL (ref 8.4–10.5)
Chloride: 99 mEq/L (ref 96–112)
Creatinine, Ser: 0.82 mg/dL (ref 0.40–1.20)
GFR: 70.6 mL/min (ref >60.00)
Glucose, Bld: 95 mg/dL (ref 70–99)
Potassium: 4.8 mEq/L (ref 3.5–5.1)
Sodium: 136 mEq/L (ref 135–145)
Total Bilirubin: 0.7 mg/dL (ref 0.2–1.2)
Total Protein: 7.2 g/dL (ref 6.0–8.3)

## 2023-06-19 LAB — CBC WITH DIFFERENTIAL/PLATELET
Basophils Absolute: 0 10*3/uL (ref 0.0–0.1)
Basophils Relative: 0.9 % (ref 0.0–3.0)
Eosinophils Absolute: 0.2 10*3/uL (ref 0.0–0.7)
Eosinophils Relative: 3.3 % (ref 0.0–5.0)
HCT: 40.5 % (ref 36.0–46.0)
Hemoglobin: 13.3 g/dL (ref 12.0–15.0)
Lymphocytes Relative: 33.9 % (ref 12.0–46.0)
Lymphs Abs: 1.8 10*3/uL (ref 0.7–4.0)
MCHC: 32.8 g/dL (ref 30.0–36.0)
MCV: 90.9 fl (ref 78.0–100.0)
Monocytes Absolute: 0.4 10*3/uL (ref 0.1–1.0)
Monocytes Relative: 7.8 % (ref 3.0–12.0)
Neutro Abs: 2.9 10*3/uL (ref 1.4–7.7)
Neutrophils Relative %: 54.1 % (ref 43.0–77.0)
Platelets: 363 10*3/uL (ref 150.0–400.0)
RBC: 4.46 Mil/uL (ref 3.87–5.11)
RDW: 14 % (ref 11.5–15.5)
WBC: 5.4 10*3/uL (ref 4.0–10.5)

## 2023-06-19 LAB — LIPID PANEL
Cholesterol: 310 mg/dL — ABNORMAL HIGH (ref 0–200)
HDL: 123.5 mg/dL (ref >39.00)
LDL Cholesterol: 176 mg/dL — ABNORMAL HIGH (ref 0–99)
NonHDL: 186.3
Total CHOL/HDL Ratio: 3
Triglycerides: 53 mg/dL (ref 0.0–149.0)
VLDL: 10.6 mg/dL (ref 0.0–40.0)

## 2023-06-19 LAB — HEMOGLOBIN A1C: Hgb A1c MFr Bld: 5.7 % (ref 4.6–6.5)

## 2023-06-19 LAB — TSH: TSH: 2.24 u[IU]/mL (ref 0.35–5.50)

## 2023-06-20 MED ORDER — ROSUVASTATIN CALCIUM 10 MG PO TABS: 10.0000 mg | ORAL_TABLET | Freq: Every day | ORAL | 3 refills | Status: DC

## 2023-06-20 NOTE — Addendum Note (Signed)
Addended by: Pincus Sanes on: 06/20/2023 07:22 PM   Modules accepted: Orders

## 2023-07-02 ENCOUNTER — Telehealth: Payer: Self-pay | Admitting: Radiology

## 2023-07-02 NOTE — Telephone Encounter (Signed)
Left voice mail for patient to call back at [phone] to schedule Medicare Annual Wellness Visit    Last AWV:  06/05/2018   Please schedule Sequential/Initial AWV with [facility]. CMA

## 2023-08-04 ENCOUNTER — Other Ambulatory Visit: Payer: Self-pay | Admitting: Internal Medicine

## 2023-09-20 ENCOUNTER — Other Ambulatory Visit: Payer: Self-pay | Admitting: Internal Medicine

## 2023-10-19 ENCOUNTER — Encounter (HOSPITAL_BASED_OUTPATIENT_CLINIC_OR_DEPARTMENT_OTHER): Payer: Self-pay

## 2023-10-19 ENCOUNTER — Other Ambulatory Visit: Payer: Self-pay

## 2023-10-19 ENCOUNTER — Inpatient Hospital Stay (HOSPITAL_COMMUNITY): Payer: PPO

## 2023-10-19 ENCOUNTER — Emergency Department (HOSPITAL_BASED_OUTPATIENT_CLINIC_OR_DEPARTMENT_OTHER): Payer: PPO

## 2023-10-19 ENCOUNTER — Inpatient Hospital Stay (HOSPITAL_BASED_OUTPATIENT_CLINIC_OR_DEPARTMENT_OTHER)
Admission: EM | Admit: 2023-10-19 | Discharge: 2023-10-25 | DRG: 481 | Disposition: A | Discharge status: Home Health | Payer: PPO | Attending: Internal Medicine | Admitting: Internal Medicine

## 2023-10-19 DIAGNOSIS — R609 Edema, unspecified: Secondary | ICD-10-CM | POA: Diagnosis not present

## 2023-10-19 DIAGNOSIS — Z6827 Body mass index (BMI) 27.0-27.9, adult: Secondary | ICD-10-CM

## 2023-10-19 DIAGNOSIS — Z833 Family history of diabetes mellitus: Secondary | ICD-10-CM

## 2023-10-19 DIAGNOSIS — J309 Allergic rhinitis, unspecified: Secondary | ICD-10-CM | POA: Diagnosis present

## 2023-10-19 DIAGNOSIS — S72409A Unspecified fracture of lower end of unspecified femur, initial encounter for closed fracture: Secondary | ICD-10-CM | POA: Diagnosis not present

## 2023-10-19 DIAGNOSIS — E669 Obesity, unspecified: Secondary | ICD-10-CM | POA: Diagnosis present

## 2023-10-19 DIAGNOSIS — Z043 Encounter for examination and observation following other accident: Secondary | ICD-10-CM | POA: Diagnosis not present

## 2023-10-19 DIAGNOSIS — W19XXXA Unspecified fall, initial encounter: Secondary | ICD-10-CM | POA: Diagnosis not present

## 2023-10-19 DIAGNOSIS — M25462 Effusion, left knee: Secondary | ICD-10-CM | POA: Diagnosis not present

## 2023-10-19 DIAGNOSIS — T07XXXA Unspecified multiple injuries, initial encounter: Secondary | ICD-10-CM | POA: Diagnosis not present

## 2023-10-19 DIAGNOSIS — Z881 Allergy status to other antibiotic agents status: Secondary | ICD-10-CM

## 2023-10-19 DIAGNOSIS — Z79899 Other long term (current) drug therapy: Secondary | ICD-10-CM

## 2023-10-19 DIAGNOSIS — M25531 Pain in right wrist: Secondary | ICD-10-CM | POA: Diagnosis present

## 2023-10-19 DIAGNOSIS — R7303 Prediabetes: Secondary | ICD-10-CM | POA: Diagnosis not present

## 2023-10-19 DIAGNOSIS — W101XXA Fall (on)(from) sidewalk curb, initial encounter: Secondary | ICD-10-CM | POA: Diagnosis present

## 2023-10-19 DIAGNOSIS — S82002A Unspecified fracture of left patella, initial encounter for closed fracture: Secondary | ICD-10-CM | POA: Diagnosis present

## 2023-10-19 DIAGNOSIS — E785 Hyperlipidemia, unspecified: Secondary | ICD-10-CM | POA: Diagnosis present

## 2023-10-19 DIAGNOSIS — S72492A Other fracture of lower end of left femur, initial encounter for closed fracture: Secondary | ICD-10-CM | POA: Diagnosis not present

## 2023-10-19 DIAGNOSIS — Z87891 Personal history of nicotine dependence: Secondary | ICD-10-CM | POA: Diagnosis not present

## 2023-10-19 DIAGNOSIS — D62 Acute posthemorrhagic anemia: Secondary | ICD-10-CM | POA: Diagnosis present

## 2023-10-19 DIAGNOSIS — S72422A Displaced fracture of lateral condyle of left femur, initial encounter for closed fracture: Secondary | ICD-10-CM | POA: Diagnosis not present

## 2023-10-19 DIAGNOSIS — R110 Nausea: Secondary | ICD-10-CM | POA: Diagnosis not present

## 2023-10-19 DIAGNOSIS — M25571 Pain in right ankle and joints of right foot: Secondary | ICD-10-CM | POA: Diagnosis not present

## 2023-10-19 DIAGNOSIS — M799 Soft tissue disorder, unspecified: Secondary | ICD-10-CM | POA: Diagnosis not present

## 2023-10-19 DIAGNOSIS — S80919A Unspecified superficial injury of unspecified knee, initial encounter: Secondary | ICD-10-CM | POA: Diagnosis not present

## 2023-10-19 DIAGNOSIS — G250 Essential tremor: Secondary | ICD-10-CM | POA: Diagnosis present

## 2023-10-19 DIAGNOSIS — S72425A Nondisplaced fracture of lateral condyle of left femur, initial encounter for closed fracture: Secondary | ICD-10-CM | POA: Diagnosis not present

## 2023-10-19 DIAGNOSIS — S72412A Displaced unspecified condyle fracture of lower end of left femur, initial encounter for closed fracture: Secondary | ICD-10-CM | POA: Diagnosis not present

## 2023-10-19 DIAGNOSIS — S92034A Nondisplaced avulsion fracture of tuberosity of right calcaneus, initial encounter for closed fracture: Secondary | ICD-10-CM | POA: Diagnosis not present

## 2023-10-19 DIAGNOSIS — M8580 Other specified disorders of bone density and structure, unspecified site: Secondary | ICD-10-CM | POA: Diagnosis not present

## 2023-10-19 DIAGNOSIS — S82042A Displaced comminuted fracture of left patella, initial encounter for closed fracture: Secondary | ICD-10-CM | POA: Diagnosis not present

## 2023-10-19 DIAGNOSIS — S72402Q Unspecified fracture of lower end of left femur, subsequent encounter for open fracture type I or II with malunion: Secondary | ICD-10-CM | POA: Diagnosis not present

## 2023-10-19 HISTORY — DX: Essential tremor: G25.0

## 2023-10-19 LAB — CBC WITH DIFFERENTIAL/PLATELET
Abs Immature Granulocytes: 0.02 10*3/uL (ref 0.00–0.07)
Basophils Absolute: 0 10*3/uL (ref 0.0–0.1)
Basophils Relative: 0 %
Eosinophils Absolute: 0 10*3/uL (ref 0.0–0.5)
Eosinophils Relative: 0 %
HCT: 38.1 % (ref 36.0–46.0)
Hemoglobin: 12.8 g/dL (ref 12.0–15.0)
Immature Granulocytes: 0 %
Lymphocytes Relative: 15 %
Lymphs Abs: 1.4 10*3/uL (ref 0.7–4.0)
MCH: 29.6 pg (ref 26.0–34.0)
MCHC: 33.6 g/dL (ref 30.0–36.0)
MCV: 88 fL (ref 80.0–100.0)
Monocytes Absolute: 0.6 10*3/uL (ref 0.1–1.0)
Monocytes Relative: 7 %
Neutro Abs: 7.2 10*3/uL (ref 1.7–7.7)
Neutrophils Relative %: 78 %
Platelets: 293 10*3/uL (ref 150–400)
RBC: 4.33 MIL/uL (ref 3.87–5.11)
RDW: 13.9 % (ref 11.5–15.5)
WBC: 9.3 10*3/uL (ref 4.0–10.5)
nRBC: 0 % (ref 0.0–0.2)

## 2023-10-19 LAB — BASIC METABOLIC PANEL
Anion gap: 12 (ref 5–15)
BUN: 22 mg/dL (ref 8–23)
CO2: 22 mmol/L (ref 22–32)
Calcium: 9.6 mg/dL (ref 8.9–10.3)
Chloride: 104 mmol/L (ref 98–111)
Creatinine, Ser: 0.69 mg/dL (ref 0.44–1.00)
GFR, Estimated: >60 mL/min (ref >60)
Glucose, Bld: 111 mg/dL — ABNORMAL HIGH (ref 70–99)
Potassium: 3.7 mmol/L (ref 3.5–5.1)
Sodium: 138 mmol/L (ref 135–145)

## 2023-10-19 MED ORDER — OXYCODONE HCL 5 MG PO TABS
5.0000 mg | ORAL_TABLET | ORAL | Status: DC | PRN
  Administered 2023-10-19 – 2023-10-25 (×28): 5 mg via ORAL
  Filled 2023-10-19 (×28): qty 1

## 2023-10-19 MED ORDER — ONDANSETRON HCL 4 MG/2ML IJ SOLN: 4.0000 mg | Freq: Four times a day (QID) | INTRAMUSCULAR | Status: DC | PRN

## 2023-10-19 MED ORDER — POLYETHYLENE GLYCOL 3350 17 G PO PACK
17.0000 g | PACK | Freq: Every day | ORAL | Status: DC | PRN
  Administered 2023-10-22 – 2023-10-23 (×2): 17 g via ORAL
  Filled 2023-10-19 (×2): qty 1

## 2023-10-19 MED ORDER — METHOCARBAMOL 500 MG PO TABS
500.0000 mg | ORAL_TABLET | Freq: Four times a day (QID) | ORAL | Status: DC | PRN
  Administered 2023-10-19 – 2023-10-24 (×12): 500 mg via ORAL
  Filled 2023-10-19 (×13): qty 1

## 2023-10-19 MED ORDER — PROCHLORPERAZINE EDISYLATE 10 MG/2ML IJ SOLN: 10.0000 mg | Freq: Four times a day (QID) | INTRAMUSCULAR | Status: DC | PRN

## 2023-10-19 MED ORDER — ENOXAPARIN SODIUM 40 MG/0.4ML IJ SOSY
40.0000 mg | PREFILLED_SYRINGE | INTRAMUSCULAR | Status: DC
  Administered 2023-10-19: 40 mg via SUBCUTANEOUS
  Filled 2023-10-19: qty 0.4

## 2023-10-19 MED ORDER — HYDROCODONE-ACETAMINOPHEN 5-325 MG PO TABS
1.0000 | ORAL_TABLET | ORAL | Status: DC | PRN
  Administered 2023-10-19: 1 via ORAL
  Filled 2023-10-19: qty 1

## 2023-10-19 MED ORDER — MELATONIN 5 MG PO TABS
5.0000 mg | ORAL_TABLET | Freq: Every evening | ORAL | Status: DC | PRN
  Administered 2023-10-19 – 2023-10-24 (×2): 5 mg via ORAL
  Filled 2023-10-19 (×2): qty 1

## 2023-10-19 MED ORDER — MORPHINE SULFATE (PF) 2 MG/ML IV SOLN
2.0000 mg | INTRAVENOUS | Status: DC | PRN
  Administered 2023-10-19 – 2023-10-20 (×4): 2 mg via INTRAVENOUS
  Filled 2023-10-19 (×4): qty 1

## 2023-10-19 MED ORDER — MORPHINE SULFATE (PF) 2 MG/ML IV SOLN
0.5000 mg | INTRAVENOUS | Status: DC | PRN
  Administered 2023-10-19 (×2): 0.5 mg via INTRAVENOUS
  Filled 2023-10-19 (×2): qty 1

## 2023-10-19 MED ORDER — METHOCARBAMOL 1000 MG/10ML IJ SOLN: 500.0000 mg | Freq: Four times a day (QID) | INTRAMUSCULAR | Status: DC | PRN

## 2023-10-19 MED ORDER — HYDROCODONE-ACETAMINOPHEN 5-325 MG PO TABS
1.0000 | ORAL_TABLET | Freq: Four times a day (QID) | ORAL | Status: DC | PRN
  Administered 2023-10-19: 1 via ORAL
  Filled 2023-10-19: qty 1

## 2023-10-19 MED ORDER — HYDROCODONE-ACETAMINOPHEN 5-325 MG PO TABS
1.0000 | ORAL_TABLET | Freq: Once | ORAL | Status: AC
  Administered 2023-10-19: 1 via ORAL
  Filled 2023-10-19: qty 1

## 2023-10-19 MED ORDER — MORPHINE SULFATE (PF) 2 MG/ML IV SOLN: 2.0000 mg | Freq: Once | INTRAVENOUS | Status: DC | PRN

## 2023-10-19 MED ORDER — MAGNESIUM OXIDE -MG SUPPLEMENT 400 (240 MG) MG PO TABS
200.0000 mg | ORAL_TABLET | Freq: Every evening | ORAL | Status: DC
  Administered 2023-10-19 – 2023-10-24 (×6): 200 mg via ORAL
  Filled 2023-10-19 (×6): qty 1

## 2023-10-19 MED ORDER — SODIUM CHLORIDE 0.9 % IV SOLN: INTRAVENOUS | Status: DC

## 2023-10-19 MED ORDER — ACETAMINOPHEN 500 MG PO TABS
1000.0000 mg | ORAL_TABLET | Freq: Four times a day (QID) | ORAL | Status: DC
  Administered 2023-10-19 – 2023-10-25 (×22): 1000 mg via ORAL
  Filled 2023-10-19 (×24): qty 2

## 2023-10-19 NOTE — ED Notes (Signed)
Report called and given to [place] on [address] at [place] [address].

## 2023-10-19 NOTE — Plan of Care (Signed)
  Problem: Coping: Goal: Level of anxiety will decrease Outcome: Progressing   Problem: Pain Management: Goal: General experience of comfort will improve Outcome: Progressing

## 2023-10-19 NOTE — H&P (Addendum)
History and Physical    Patient: Heather Rivera HYQ:[id number] DOB: 1951/01/16 DOA: 10/19/2023 DOS: the patient was seen and examined on 10/19/2023 PCP: Pincus Sanes, MD  Patient coming from: Home  Chief Complaint:  Chief Complaint  Patient presents with   Fall   HPI: Heather Rivera is a 72 y.o. female with medical history significant of hyperlipidemia, benign essential tremor, prediabetes, and osteopenia. She presents to [facility] ED via EMS after a mechanical fall.  Fall was a mechanical fall and was not preceded by any dizziness, change in sensorium, vision changes, or palpitations.  She denies head strike or loss of consciousness, she is not on any outpatient anticoagulants or ASA. She reports a trip over the curb when trying to get into her car and immediately noted pain and an inability to bear weight on the left lower extremity, she also had pain of the right heel but was able to bear weight, and she also has pain of the right wrist where she tried to catch herself as she fell.  She reports being able to lift herself into her car and she drove home, she then managed to get herself inside of her home and realized that she was in more pain than anticipated and called her daughter.  The daughter was unable to get her out of the home to bring her to the emergency department and they subsequently called EMS.   ED Course: On arrival to [facility] ED she was noted to be afebrile temp 37.1C, BP 118/71, HR 79, RR 18, SpO2 96% on room air. Plain films of the (L) knee and (R) ankle obtained and shows oblique fracture through the (L) lateral femoral condyle and an avulsion fracture from distal aspect of the (R) calcaneus. CT (L) knee shows vertical fracture through the distal femoral metaphysis of the femur extending into the femoral condyle adjacent to the intercondylar notch with displacement and a comminuted fracture of the lateral aspect of the patella. She was given Norco with improvement in pain and  Dr Roda Shutters with orthopedics was consulted and recommended admission for pain control and possible surgical repair. [facility] contacted for admission.  Review of Systems: As mentioned in the history of present illness. All other systems reviewed and are negative. Past Medical History:  Diagnosis Date   ALLERGIC RHINITIS    Essential tremor    Female pattern baldness    OBESITY    Past Surgical History:  Procedure Laterality Date   BREAST BIOPSY Left 12/20/2022   MM LT BREAST BX W LOC DEV 1ST LESION IMAGE BX SPEC STEREO GUIDE 12/20/2022 [facility]   CESAREAN SECTION  1982   glass removed from finger  2010   Social History:  reports that she quit smoking about 44 years ago. Her smoking use included cigarettes. She has never used smokeless tobacco. She reports that she does not currently use alcohol. She reports that she does not use drugs.  Allergies  Allergen Reactions   Augmentin [Amoxicillin-Pot Clavulanate] Nausea And Vomiting, Rash and Other (See Comments)    Rash, N/V    Family History  Problem Relation Age of Onset   Alcohol abuse Mother    Arthritis Mother        grandparent   COPD Mother    Diabetes Mother    Parkinson's disease Father    Heart disease Brother    Diabetes Maternal Grandmother    Healthy Child     Prior to Admission medications   Medication Sig Start  Date End Date Taking? Authorizing Provider  ASHWAGANDHA PO Take by mouth daily.    [provider]  Cyanocobalamin (VITAMIN B-12 PO) Vitamin B-12    [provider]  fluticasone (FLONASE) 50 MCG/ACT nasal spray Place 2 sprays into both nostrils daily. 08/29/20   Myrlene Broker, MD  loratadine (CLARITIN) 10 MG tablet Take 10 mg by mouth daily.    [provider]  Magnesium 100 MG CAPS 400 mg.    [provider]  montelukast (SINGULAIR) 10 MG tablet TAKE 1 TABLET BY MOUTH EVERYDAY AT BEDTIME 08/05/23   Pincus Sanes, MD  Probiotic Product (PROBIOTIC DAILY PO) Take by  mouth.    [provider]  propranolol (INDERAL) 20 MG tablet TAKE 1 TABLET BY MOUTH 2 TIMES DAILY AS NEEDED. 09/20/23   Pincus Sanes, MD  rosuvastatin (CRESTOR) 10 MG tablet Take 1 tablet (10 mg total) by mouth daily. 06/20/23   Pincus Sanes, MD  VITAMIN D, CHOLECALCIFEROL, PO Take by mouth.    [provider]    Physical Exam: Vitals:   10/19/23 0200 10/19/23 0300 10/19/23 0400 10/19/23 0710  BP: 133/74 137/73 133/75 (!) 115/58  Pulse: 85 94 84 85  Resp: 18  18 18   Temp: 98.5 F (36.9 C)   98.4 F (36.9 C)  TempSrc:    Oral  SpO2: 94% 97% 97% 94%  Weight:      Height:      Review of Systems  Constitutional: Negative.   HENT: Negative.    Eyes: Negative.   Respiratory: Negative.    Cardiovascular: Negative.   Gastrointestinal: Negative.   Genitourinary: Negative.   Musculoskeletal: Negative.   Skin: Negative.   Neurological: Negative.   Endo/Heme/Allergies: Negative.   Psychiatric/Behavioral: Negative.     Physical Exam Constitutional:      General: She is not in acute distress.    Appearance: Normal appearance.  HENT:     Head: Normocephalic and atraumatic.     Mouth/Throat:     Mouth: Mucous membranes are moist.  Eyes:     Extraocular Movements: Extraocular movements intact.  Cardiovascular:     Rate and Rhythm: Normal rate and regular rhythm.  Pulmonary:     Effort: Pulmonary effort is normal. No respiratory distress.     Breath sounds: Normal breath sounds. No wheezing.  Abdominal:     General: Bowel sounds are normal. There is no distension.     Palpations: Abdomen is soft.     Tenderness: There is no abdominal tenderness.  Musculoskeletal:     Cervical back: Normal range of motion and neck supple.     Comments: Right foot in ankle brace no swelling appreciated. DP intact. No paresthesia.  Left knee with brace in place no significant edema appreciated. Left DP intact  Skin:    General: Skin is warm and dry.  Neurological:      General: No focal deficit present.     Mental Status: She is alert.  Psychiatric:        Mood and Affect: Mood normal.        Behavior: Behavior normal.    Data Reviewed: CBC    Component Value Date/Time   WBC 9.3 10/19/2023 0315   RBC 4.33 10/19/2023 0315   HGB 12.8 10/19/2023 0315   HCT 38.1 10/19/2023 0315   PLT 293 10/19/2023 0315   MCV 88.0 10/19/2023 0315   MCH 29.6 10/19/2023 0315   MCHC 33.6 10/19/2023 0315  RDW 13.9 10/19/2023 0315   LYMPHSABS 1.4 10/19/2023 0315   MONOABS 0.6 10/19/2023 0315   EOSABS 0.0 10/19/2023 0315   BASOSABS 0.0 10/19/2023 0315      Latest Ref Rng & Units 10/19/2023    3:15 AM 06/19/2023    8:44 AM 06/06/2022    9:52 AM  BMP  Glucose 70 - 99 mg/dL 161  95  96   BUN 8 - 23 mg/dL 22  19  15    Creatinine 0.44 - 1.00 mg/dL 0.96  0.45  4.09   Sodium 135 - 145 mmol/L 138  136  137   Potassium 3.5 - 5.1 mmol/L 3.7  4.8  3.9   Chloride 98 - 111 mmol/L 104  99  100   CO2 22 - 32 mmol/L 22  30  31    Calcium 8.9 - 10.3 mg/dL 9.6  81.1  9.8    CT Knee Left Wo Contrast  Result Date: 10/19/2023 CLINICAL DATA:  Recent trip and fall with known distal femoral fracture EXAM: CT OF THE LEFT KNEE WITHOUT CONTRAST TECHNIQUE: Multidetector CT imaging of the left knee was performed according to the standard protocol. Multiplanar CT image reconstructions were also generated. RADIATION DOSE REDUCTION: This exam was performed according to the departmental dose-optimization program which includes automated exposure control, adjustment of the mA and/or kV according to patient size and/or use of iterative reconstruction technique. COMPARISON:  Knee film from earlier in the same day. FINDINGS: Bones/Joint/Cartilage The previously seen vertical fracture through the distal diaphysis into the lateral femoral condyle is again identified with only mild displacement. The patella also demonstrates evidence of a comminuted fracture with only minimal displacement. This was not well  appreciated on the prior plain film. No other fractures are seen. Ligaments Suboptimally assessed by CT. Muscles and Tendons Surrounding musculature is within normal limits. Soft tissues Joint effusion is seen.  No other soft tissue abnormality is noted. IMPRESSION: Vertical fracture through the distal femoral metaphysis of the femur extending into the femoral condyle adjacent to the intercondylar notch. About 2 mm of superior displacement at the fracture site is noted. Additionally, a comminuted fracture of the lateral aspect of the patella is noted with only minimal displacement identified. Electronically Signed   By: Alcide Clever M.D.   On: 10/19/2023 03:49   DG Ankle Complete Right  Result Date: 10/19/2023 CLINICAL DATA:  Recent fall with ankle pain, initial encounter EXAM: RIGHT ANKLE - COMPLETE 3+ VIEW COMPARISON:  None Available. FINDINGS: Small avulsion fracture is noted laterally likely related to the distal aspect of the calcaneus. Mild adjacent soft tissue swelling is noted. No other focal abnormality is noted. IMPRESSION: Avulsion fracture from distal aspect of the calcaneus. Electronically Signed   By: Alcide Clever M.D.   On: 10/19/2023 02:35   DG Knee Complete 4 Views Left  Result Date: 10/19/2023 CLINICAL DATA:  Recent fall with knee pain, initial encounter EXAM: LEFT KNEE - COMPLETE 4+ VIEW COMPARISON:  None Available. FINDINGS: Proximal tibia and fibular are within normal limits. Oblique fracture is noted through the lateral femoral condyle extending into the intercondylar notch. Small joint effusion is noted. Patella appears within normal limits. IMPRESSION: Oblique fracture through the lateral femoral condyle as described. Electronically Signed   By: Alcide Clever M.D.   On: 10/19/2023 02:28    Assessment and Plan: Oblique fracture through the lateral femoral condyle  Avulsion fracture from distal aspect of the calcaneus -Scheduled Tylenol - As needed oxycodone and Dilaudid -Zofran  and Compazine for nausea -Transfer to [facility] for tentative surgery on Monday with orthopedic surgery - PT/OT evaluation post-op  Hyperlipidemia -Continue home Crestor  Essential Tremor -Continue home propanolol  VTE prophylaxis: Lovenox GI prophylaxis: Not indicated  Code Status: FULL Disposition: Admit to Med-Surg  Advance Care Planning:   Code Status: Full Code   Consults:  Ortho  Family Communication: Daughter at bedside  Severity of Illness: The appropriate patient status for this patient is INPATIENT. Inpatient status is judged to be reasonable and necessary in order to provide the required intensity of service to ensure the patient's safety. The patient's presenting symptoms, physical exam findings, and initial radiographic and laboratory data in the context of their chronic comorbidities is felt to place them at high risk for further clinical deterioration. Furthermore, it is not anticipated that the patient will be medically stable for discharge from the hospital within 2 midnights of admission.   * I certify that at the point of admission it is my clinical judgment that the patient will require inpatient hospital care spanning beyond 2 midnights from the point of admission due to high intensity of service, high risk for further deterioration and high frequency of surveillance required.Lewie Chamber, MD [facility] 10/19/2023, 11:59 AM

## 2023-10-19 NOTE — ED Notes (Signed)
Left knee elevated on a pillow and ice applied.

## 2023-10-19 NOTE — ED Triage Notes (Signed)
Pt brought in by EMS for fall (pt tripped walking on curb). Pt fell and hit left knee on the ground. Pain primarily in left knee, mild pain in right ankle. Did not hit head. (-) thinners. (-) LOC.

## 2023-10-19 NOTE — ED Notes (Signed)
Pt transported to [facility]

## 2023-10-19 NOTE — ED Provider Notes (Signed)
[place] EMERGENCY DEPARTMENT AT [facility]  Provider Note  CSN: [id number] Arrival date & time: 10/19/23 0131  History Chief Complaint  Patient presents with   Heather Rivera is a 72 y.o. female reports she tripped over a curb and fell around 2130hrs tonight, injuring her L knee. it took her a long time to get to her car and then to get into her house. She has not been able to bear weight. She called her daughter who was also unable to get her out of her house and so EMS was called to bring her here. Also having some R ankle pain, but able to bear weight there. No other reported injuries, head trauma or LOC.    Home Medications Prior to Admission medications   Medication Sig Start Date End Date Taking? Authorizing Provider  ASHWAGANDHA PO Take by mouth daily.    [provider]  Cyanocobalamin (VITAMIN B-12 PO) Vitamin B-12    [provider]  fluticasone (FLONASE) 50 MCG/ACT nasal spray Place 2 sprays into both nostrils daily. 08/29/20   Myrlene Broker, MD  loratadine (CLARITIN) 10 MG tablet Take 10 mg by mouth daily.    [provider]  Magnesium 100 MG CAPS 400 mg.    [provider]  montelukast (SINGULAIR) 10 MG tablet TAKE 1 TABLET BY MOUTH EVERYDAY AT BEDTIME 08/05/23   Pincus Sanes, MD  Probiotic Product (PROBIOTIC DAILY PO) Take by mouth.    [provider]  propranolol (INDERAL) 20 MG tablet TAKE 1 TABLET BY MOUTH 2 TIMES DAILY AS NEEDED. 09/20/23   Pincus Sanes, MD  rosuvastatin (CRESTOR) 10 MG tablet Take 1 tablet (10 mg total) by mouth daily. 06/20/23   Pincus Sanes, MD  VITAMIN D, CHOLECALCIFEROL, PO Take by mouth.    [provider]     Allergies    Augmentin [amoxicillin-pot clavulanate]   Review of Systems   Review of Systems Please see HPI for pertinent positives and negatives  Physical Exam BP 133/75   Pulse 84   Temp 98.5 F (36.9 C)   Resp 18   Ht 5' 5.5" (1.664 m)    Wt 74.8 kg   SpO2 97%   BMI 27.04 kg/m   Physical Exam Vitals and nursing note reviewed.  Constitutional:      Appearance: Normal appearance.  HENT:     Head: Normocephalic and atraumatic.     Nose: Nose normal.     Mouth/Throat:     Mouth: Mucous membranes are moist.  Eyes:     Extraocular Movements: Extraocular movements intact.     Conjunctiva/sclera: Conjunctivae normal.  Cardiovascular:     Rate and Rhythm: Normal rate.  Pulmonary:     Effort: Pulmonary effort is normal.     Breath sounds: Normal breath sounds.  Abdominal:     General: Abdomen is flat.     Palpations: Abdomen is soft.     Tenderness: There is no abdominal tenderness.  Musculoskeletal:        General: Swelling and tenderness (L anterior knee; abrasion, ecchymosis, swelling) present.     Cervical back: Neck supple.  Skin:    General: Skin is warm and dry.  Neurological:     General: No focal deficit present.     Mental Status: She is alert.  Psychiatric:        Mood and Affect: Mood normal.     ED Results / Procedures / Treatments  EKG None  Procedures Procedures  Medications Ordered in the ED Medications  HYDROcodone-acetaminophen (NORCO/VICODIN) 5-325 MG per tablet 1 tablet (1 tablet Oral Given 10/19/23 0346)    Initial Impression and Plan  Patient here after mechanical fall and L knee injury. Also some R ankle soreness. Will check xrays, she declines pain medication now, took some motrin PTA.   ED Course   Clinical Course as of 10/19/23 0427  Sat Oct 19, 2023  0250 I personally viewed the images from radiology studies and agree with radiologist interpretation: Xrays show small avulsion fracture on R calcaneus as well as an oblique fracture of L lateral femoral condyle. I spoke with Dr. Roda Shutters, Ortho, who will review images and call back with recommendations.  [CS]  0309 Dr. Roda Shutters has reviewed films, recommends CT for further evaluation of her fracture. Knee immobilizer and pain meds for  comfort. She will need admission to medical service for pain control, possible surgical repair vs conservative management. Patient and daughter are amenable to this plan. Hospitalist paged.  [CS]  0401 CBC is normal.  [CS]  0424 Spoke with Dr. Janalyn Shy, Hospitalist, who will accept for admission.  [CS]  0426 BMP is unremarkable.  [CS]    Clinical Course User Index [CS] Pollyann Savoy, MD     MDM Rules/Calculators/A&P Medical Decision Making Problems Addressed: Closed nondisplaced avulsion fracture of tuberosity of right calcaneus, initial encounter: acute illness or injury Closed nondisplaced fracture of lateral condyle of left femur, initial encounter [facility]): acute illness or injury Closed nondisplaced fracture of left patella, unspecified fracture morphology, initial encounter: acute illness or injury Fall, initial encounter: acute illness or injury  Amount and/or Complexity of Data Reviewed Labs: ordered. Decision-making details documented in ED Course. Radiology: ordered and independent interpretation performed. Decision-making details documented in ED Course.  Risk Prescription drug management. Decision regarding hospitalization.     Final Clinical Impression(s) / ED Diagnoses Final diagnoses:  Fall, initial encounter  Closed nondisplaced fracture of lateral condyle of left femur, initial encounter (HCC)  Closed nondisplaced avulsion fracture of tuberosity of right calcaneus, initial encounter  Closed nondisplaced fracture of left patella, unspecified fracture morphology, initial encounter    Rx / DC Orders ED Discharge Orders     None        Pollyann Savoy, MD 10/19/23 [phone]

## 2023-10-19 NOTE — Plan of Care (Addendum)
Plan of Care Note for accepted transfer  Patient: Heather Rivera              OZH:[id number]  DOA: 10/19/2023     Facility requesting transfer: [facility] emergency department Requesting Provider: Dr. Bernette Mayers  Reason for transfer: Evaluation for fracture of distal femur and avulsion fracture of distal calcaneus.  Facility course: [name] is a 72 y.o. female (unable to find any medical problem on the list) reports she tripped over a curb and fell around 2130hrs tonight, injuring her L knee. it took her a long time to get to her car and then to get into her house. She has not been able to bear weight. She called her daughter who was also unable to get her out of her house and so EMS was called to bring her here. Also having some R ankle pain, but able to bear weight there. No other reported injuries, head trauma or LOC.     At presentation to ED patient is hemodynamically stable CBC unremarkable.  BMP is in process.  X-ray of the left knee showed: Oblique fracture through the lateral femoral condyle as described   X-ray of the ankle showed:Avulsion fracture from distal aspect of the calcaneus.    CT scan of the knee showed: Vertical fracture through the distal femoral metaphysis of the femur extending into the femoral condyle adjacent to the intercondylar notch. About 2 mm of superior displacement at the fracture site is noted. Additionally, a comminuted fracture of the lateral aspect of the patella is noted with only minimal displacement identified.  ED physician spoke with [facility] orthopedics group Dr. Roda Shutters recommended to admit patient under hospitalist service and will see patient in the a.m.  No other recommendation at this time.  Need to inform on-call orthopedics at [facility] upon arrival.   Plan of care: The patient is accepted for admission for inpatient status to Med-surg  unit, at [facility].  Check [email or web address] for on-call coverage.  [facility] will  assume care on arrival to accepting facility. Until arrival, medical decision making responsibilities remain with the EDP.  However, [facility] available 24/7 for questions and assistance.   Nursing staff please page [facility] Admits and Consults [phone]) as soon as the patient arrives to the hospital.    Author: Tereasa Coop, MD  10/19/2023  [facility]

## 2023-10-19 NOTE — Consult Note (Signed)
ORTHOPAEDIC CONSULTATION  REQUESTING PHYSICIAN: Lewie Chamber, MD  Chief Complaint: Left distal femur and patella fx  HPI: Heather Rivera is a 72 y.o. female who presents with left distal femur and patella fx s/p mechanical fall off a curb yesterday.  Presented to [facility].  Reports severe pain in left knee region.    Past Medical History:  Diagnosis Date   ALLERGIC RHINITIS    Essential tremor    Female pattern baldness    OBESITY    Past Surgical History:  Procedure Laterality Date   BREAST BIOPSY Left 12/20/2022   MM LT BREAST BX W LOC DEV 1ST LESION IMAGE BX SPEC STEREO GUIDE 12/20/2022 [facility]   CESAREAN SECTION  1982   glass removed from finger  2010   Social History   Socioeconomic History   Marital status: Divorced    Spouse name: Not on file   Number of children: 2   Years of education: Not on file   Highest education level: Not on file  Occupational History   Not on file  Tobacco Use   Smoking status: Former    Current packs/day: 0.00    Types: Cigarettes    Quit date: 11/12/1978    Years since quitting: 44.9   Smokeless tobacco: Never   Tobacco comments:    Divorced, single and lives alone. 2 grown kids. employed with hotel sales, freq travel and speaker presentation  Vaping Use   Vaping status: Never Used  Substance and Sexual Activity   Alcohol use: Not Currently    Comment: 2 glasses/night   Drug use: No   Sexual activity: Not Currently  Other Topics Concern   Not on file  Social History Narrative   Right handed    Still working Economist for hotels    Social Determinants of Health   Financial Resource Strain: Low Risk  (06/05/2018)   Overall Financial Resource Strain (CARDIA)    Difficulty of Paying Living Expenses: Not hard at all  Food Insecurity: No Food Insecurity (10/19/2023)   Hunger Vital Sign    Worried About Running Out of Food in the Last Year: Never true    Ran Out of Food in the Last Year: Never true  Transportation  Needs: No Transportation Needs (10/19/2023)   PRAPARE - Administrator, Civil Service (Medical): No    Lack of Transportation (Non-Medical): No  Physical Activity: Sufficiently Active (06/05/2018)   Exercise Vital Sign    Days of Exercise per Week: 3 days    Minutes of Exercise per Session: 60 min  Stress: No Stress Concern Present (06/05/2018)   [employer] of Occupational Health - Occupational Stress Questionnaire    Feeling of Stress : Not at all  Social Connections: Unknown (06/05/2018)   Social Connection and Isolation Panel [NHANES]    Frequency of Communication with Friends and Family: More than three times a week    Frequency of Social Gatherings with Friends and Family: More than three times a week    Attends Religious Services: Not on Marketing executive or Organizations: Yes    Attends Engineer, structural: More than 4 times per year    Marital Status: Divorced   Family History  Problem Relation Age of Onset   Alcohol abuse Mother    Arthritis Mother        grandparent   COPD Mother    Diabetes Mother    Parkinson's disease Father  Heart disease Brother    Diabetes Maternal Grandmother    Healthy Child    - negative except otherwise stated in the family history section Allergies  Allergen Reactions   Augmentin [Amoxicillin-Pot Clavulanate] Nausea And Vomiting, Rash and Other (See Comments)    Rash, N/V   Prior to Admission medications   Medication Sig Start Date End Date Taking? Authorizing Provider  ASHWAGANDHA PO Take 1 capsule by mouth daily.   Yes [provider]  fluticasone (FLONASE) 50 MCG/ACT nasal spray Place 2 sprays into both nostrils daily. 08/29/20  Yes Myrlene Broker, MD  loratadine (CLARITIN) 10 MG tablet Take 10 mg by mouth daily.   Yes [provider]  Magnesium 100 MG CAPS Take 200 mg by mouth daily.   Yes [provider]  montelukast (SINGULAIR) 10 MG tablet TAKE 1 TABLET BY  MOUTH EVERYDAY AT BEDTIME Patient taking differently: Take 10 mg by mouth at bedtime. 08/05/23  Yes Burns, Bobette Mo, MD  Probiotic Product (PROBIOTIC DAILY PO) Take 1 tablet by mouth daily.   Yes [provider]  propranolol (INDERAL) 20 MG tablet TAKE 1 TABLET BY MOUTH 2 TIMES DAILY AS NEEDED. Patient taking differently: Take 40 mg by mouth daily in the afternoon. 09/20/23  Yes Burns, Bobette Mo, MD  rosuvastatin (CRESTOR) 10 MG tablet Take 1 tablet (10 mg total) by mouth daily. 06/20/23  Yes Burns, Bobette Mo, MD  VITAMIN D, CHOLECALCIFEROL, PO Take 1 tablet by mouth daily.   Yes [provider]   CT Knee Left Wo Contrast  Result Date: 10/19/2023 CLINICAL DATA:  Recent trip and fall with known distal femoral fracture EXAM: CT OF THE LEFT KNEE WITHOUT CONTRAST TECHNIQUE: Multidetector CT imaging of the left knee was performed according to the standard protocol. Multiplanar CT image reconstructions were also generated. RADIATION DOSE REDUCTION: This exam was performed according to the departmental dose-optimization program which includes automated exposure control, adjustment of the mA and/or kV according to patient size and/or use of iterative reconstruction technique. COMPARISON:  Knee film from earlier in the same day. FINDINGS: Bones/Joint/Cartilage The previously seen vertical fracture through the distal diaphysis into the lateral femoral condyle is again identified with only mild displacement. The patella also demonstrates evidence of a comminuted fracture with only minimal displacement. This was not well appreciated on the prior plain film. No other fractures are seen. Ligaments Suboptimally assessed by CT. Muscles and Tendons Surrounding musculature is within normal limits. Soft tissues Joint effusion is seen.  No other soft tissue abnormality is noted. IMPRESSION: Vertical fracture through the distal femoral metaphysis of the femur extending into the femoral condyle adjacent to the  intercondylar notch. About 2 mm of superior displacement at the fracture site is noted. Additionally, a comminuted fracture of the lateral aspect of the patella is noted with only minimal displacement identified. Electronically Signed   By: Alcide Clever M.D.   On: 10/19/2023 03:49   DG Ankle Complete Right  Result Date: 10/19/2023 CLINICAL DATA:  Recent fall with ankle pain, initial encounter EXAM: RIGHT ANKLE - COMPLETE 3+ VIEW COMPARISON:  None Available. FINDINGS: Small avulsion fracture is noted laterally likely related to the distal aspect of the calcaneus. Mild adjacent soft tissue swelling is noted. No other focal abnormality is noted. IMPRESSION: Avulsion fracture from distal aspect of the calcaneus. Electronically Signed   By: Alcide Clever M.D.   On: 10/19/2023 02:35   DG Knee Complete 4 Views Left  Result Date: 10/19/2023 CLINICAL DATA:  Recent fall with knee pain, initial encounter EXAM: LEFT KNEE - COMPLETE 4+ VIEW COMPARISON:  None Available. FINDINGS: Proximal tibia and fibular are within normal limits. Oblique fracture is noted through the lateral femoral condyle extending into the intercondylar notch. Small joint effusion is noted. Patella appears within normal limits. IMPRESSION: Oblique fracture through the lateral femoral condyle as described. Electronically Signed   By: Alcide Clever M.D.   On: 10/19/2023 02:28   - pertinent xrays, CT, MRI studies were reviewed and independently interpreted  Positive ROS: All other systems have been reviewed and were otherwise negative with the exception of those mentioned in the HPI and as above.  Physical Exam: General: No acute distress Cardiovascular: No pedal edema Respiratory: No cyanosis, no use of accessory musculature GI: No organomegaly, abdomen is soft and non-tender Skin: No lesions in the area of chief complaint Neurologic: Sensation intact distally Psychiatric: Patient is at baseline mood and affect Lymphatic: No axillary or  cervical lymphadenopathy  MUSCULOSKELETAL:  Left knee - 2 small superficial abrasions - large joint effusion - ROM and ligamentous exam deferred due to pain  Assessment: Left distal femur fx Left patella fx  Plan: I have discussed this case with Dr. Jena Gauss who has graciously agreed to provide definitive surgical repair on Monday.  Patient may have a diet this weekend. Please transfer patient to [facility] this weekend for anticipated surgery Monday.  All questions answered to patient's satisfaction.  Thank you for the consult and the opportunity to see Ms. Argentina  N. Rivera Arvin, MD [facility] 10:10 AM

## 2023-10-19 NOTE — Plan of Care (Signed)
  Problem: Education: Goal: Knowledge of General Education information will improve Description: Including pain rating scale, medication(s)/side effects and non-pharmacologic comfort measures Outcome: Progressing   Problem: Activity: Goal: Risk for activity intolerance will decrease Outcome: Progressing   Problem: Pain Management: Goal: General experience of comfort will improve Outcome: Progressing

## 2023-10-20 DIAGNOSIS — T07XXXA Unspecified multiple injuries, initial encounter: Secondary | ICD-10-CM

## 2023-10-20 DIAGNOSIS — S72492A Other fracture of lower end of left femur, initial encounter for closed fracture: Secondary | ICD-10-CM

## 2023-10-20 LAB — SURGICAL PCR SCREEN
MRSA, PCR: NEGATIVE
Staphylococcus aureus: NEGATIVE

## 2023-10-20 LAB — CBC
HCT: 35.4 % — ABNORMAL LOW (ref 36.0–46.0)
Hemoglobin: 11.4 g/dL — ABNORMAL LOW (ref 12.0–15.0)
MCH: 29.7 pg (ref 26.0–34.0)
MCHC: 32.2 g/dL (ref 30.0–36.0)
MCV: 92.2 fL (ref 80.0–100.0)
Platelets: 230 10*3/uL (ref 150–400)
RBC: 3.84 MIL/uL — ABNORMAL LOW (ref 3.87–5.11)
RDW: 14.2 % (ref 11.5–15.5)
WBC: 6.8 10*3/uL (ref 4.0–10.5)
nRBC: 0 % (ref 0.0–0.2)

## 2023-10-20 LAB — BASIC METABOLIC PANEL
Anion gap: 9 (ref 5–15)
BUN: 16 mg/dL (ref 8–23)
CO2: 23 mmol/L (ref 22–32)
Calcium: 8.7 mg/dL — ABNORMAL LOW (ref 8.9–10.3)
Chloride: 102 mmol/L (ref 98–111)
Creatinine, Ser: 0.68 mg/dL (ref 0.44–1.00)
GFR, Estimated: >60 mL/min (ref >60)
Glucose, Bld: 110 mg/dL — ABNORMAL HIGH (ref 70–99)
Potassium: 3.7 mmol/L (ref 3.5–5.1)
Sodium: 134 mmol/L — ABNORMAL LOW (ref 135–145)

## 2023-10-20 MED ORDER — PROBIOTIC DAILY PO CAPS: ORAL_CAPSULE | Freq: Every day | ORAL | Status: DC

## 2023-10-20 MED ORDER — HYDROMORPHONE HCL 1 MG/ML IJ SOLN
1.0000 mg | INTRAMUSCULAR | Status: DC | PRN
  Administered 2023-10-20 (×2): 1 mg via INTRAVENOUS
  Filled 2023-10-20 (×2): qty 1

## 2023-10-20 MED ORDER — KETOROLAC TROMETHAMINE 30 MG/ML IJ SOLN
30.0000 mg | Freq: Once | INTRAMUSCULAR | Status: AC
  Administered 2023-10-20: 30 mg via INTRAVENOUS
  Filled 2023-10-20: qty 1

## 2023-10-20 MED ORDER — RISAQUAD PO CAPS
1.0000 | ORAL_CAPSULE | Freq: Every day | ORAL | Status: DC
  Administered 2023-10-20 – 2023-10-25 (×6): 1 via ORAL
  Filled 2023-10-20 (×6): qty 1

## 2023-10-20 MED ORDER — LORATADINE 10 MG PO TABS
10.0000 mg | ORAL_TABLET | Freq: Every day | ORAL | Status: DC
  Administered 2023-10-20 – 2023-10-25 (×6): 10 mg via ORAL
  Filled 2023-10-20 (×6): qty 1

## 2023-10-20 MED ORDER — MONTELUKAST SODIUM 10 MG PO TABS
10.0000 mg | ORAL_TABLET | Freq: Every day | ORAL | Status: DC
  Administered 2023-10-20 – 2023-10-24 (×5): 10 mg via ORAL
  Filled 2023-10-20 (×5): qty 1

## 2023-10-20 MED ORDER — FLUTICASONE PROPIONATE 50 MCG/ACT NA SUSP
2.0000 | Freq: Every day | NASAL | Status: DC
  Administered 2023-10-21 – 2023-10-25 (×5): 2 via NASAL
  Filled 2023-10-20 (×2): qty 16

## 2023-10-20 MED ORDER — CHOLECALCIFEROL 10 MCG (400 UNIT) PO TABS
400.0000 [IU] | ORAL_TABLET | Freq: Every day | ORAL | Status: DC
  Administered 2023-10-20 – 2023-10-25 (×6): 400 [IU] via ORAL
  Filled 2023-10-20 (×6): qty 1

## 2023-10-20 MED ORDER — PROPRANOLOL HCL 40 MG PO TABS
40.0000 mg | ORAL_TABLET | Freq: Every day | ORAL | Status: DC
  Administered 2023-10-20 – 2023-10-24 (×5): 40 mg via ORAL
  Filled 2023-10-20 (×6): qty 1

## 2023-10-20 MED ORDER — HYDROMORPHONE HCL 1 MG/ML IJ SOLN
1.0000 mg | Freq: Once | INTRAMUSCULAR | Status: AC | PRN
  Administered 2023-10-20: 1 mg via INTRAVENOUS
  Filled 2023-10-20: qty 1

## 2023-10-20 MED ORDER — ROSUVASTATIN CALCIUM 5 MG PO TABS
10.0000 mg | ORAL_TABLET | Freq: Every day | ORAL | Status: DC
  Administered 2023-10-20 – 2023-10-25 (×6): 10 mg via ORAL
  Filled 2023-10-20: qty 1
  Filled 2023-10-20 (×5): qty 2

## 2023-10-20 MED ORDER — LORAZEPAM 2 MG/ML IJ SOLN: 1.0000 mg | INTRAMUSCULAR | Status: DC | PRN

## 2023-10-20 NOTE — Progress Notes (Signed)
OT Cancellation Note  Patient Details Name: Heather Rivera MRN: [id number] DOB: [birth year]   Cancelled Treatment:    Reason Eval/Treat Not Completed: Medical issues which prohibited therapy Per Dr. Roda Shutters ortho note on 12/7 patient is pending transfer [facility] for [facility] intervention of left distal femur fracture and patella fx on 12/9. Therapy to continue to follow and check back after surgical intervention for updated orders.  Rosalio Loud, MS Acute Rehabilitation Department Office# [phone]  10/20/2023, 6:58 AM

## 2023-10-20 NOTE — Plan of Care (Signed)
  Problem: Education: Goal: Knowledge of General Education information will improve Description: Including pain rating scale, medication(s)/side effects and non-pharmacologic comfort measures Outcome: Progressing   Problem: Activity: Goal: Risk for activity intolerance will decrease Outcome: Progressing   Problem: Pain Management: Goal: General experience of comfort will improve Outcome: Progressing

## 2023-10-20 NOTE — Progress Notes (Signed)
Progress Note    Heather Rivera   AOZ:[id number]  DOB: October 02, 1951  DOA: 10/19/2023     1 PCP: Pincus Sanes, MD  Initial CC: mechanical fall  Hospital Course: Heather Rivera is a 72 y.o. female with medical history significant of hyperlipidemia, benign essential tremor, prediabetes, and osteopenia. She presents to [facility] ED via EMS after a mechanical fall.  Fall was a mechanical fall and was not preceded by any dizziness, change in sensorium, vision changes, or palpitations.  She denies head strike or loss of consciousness, she is not on any outpatient anticoagulants or ASA. She reports a trip over the curb when trying to get into her car and immediately noted pain and an inability to bear weight on the left lower extremity, she also had pain of the right heel but was able to bear weight, and she also has pain of the right wrist where she tried to catch herself as she fell.  She reports being able to lift herself into her car and she drove home, she then managed to get herself inside of her home and realized that she was in more pain than anticipated and called her daughter.  The daughter was unable to get her out of the home to bring her to the emergency department and they subsequently called EMS.    ED Course: On arrival to [facility] ED she was noted to be afebrile temp 37.1C, BP 118/71, HR 79, RR 18, SpO2 96% on room air. Plain films of the (L) knee and (R) ankle obtained and shows oblique fracture through the (L) lateral femoral condyle and an avulsion fracture from distal aspect of the (R) calcaneus. CT (L) knee shows vertical fracture through the distal femoral metaphysis of the femur extending into the femoral condyle adjacent to the intercondylar notch with displacement and a comminuted fracture of the lateral aspect of the patella.  After admission, she was transferred to [facility] for definitive repair with orthopedic surgery.  Interval History:  Patient still having uncontrolled  pain this morning.  Pain regimen further adjusted. Transfer to [facility] taking place today for definitive repair with orthopedic surgery on Monday.  Assessment and Plan:  Oblique fracture through the lateral femoral condyle  Avulsion fracture from distal aspect of the calcaneus -Scheduled Tylenol - As needed oxycodone and Dilaudid - toradol x 1 - might be getting nerve block with anesthesia? -Zofran and Compazine for nausea -Transfer to [facility] for tentative surgery on Monday with orthopedic surgery - PT/OT evaluation post-op   Hyperlipidemia -Continue home Crestor   Essential Tremor -Continue home propanolol     Old records reviewed in assessment of this patient  Antimicrobials:   DVT prophylaxis:  SCDs Start: 10/19/23 0758   Code Status:   Code Status: Full Code  Mobility Assessment (Last 72 Hours)     Mobility Assessment     Row Name 10/20/23 0844 10/20/23 0000 10/19/23 0701 10/19/23 0653     Does patient have an order for bedrest or is patient medically unstable Yes- Bedfast (Level 1) - Complete  fx precautions Yes- Bedfast (Level 1) - Complete Yes- Bedfast (Level 1) - Complete Yes- Bedfast (Level 1) - Complete             Barriers to discharge:  Disposition Plan:  TBD after surgery Status is: Inpt  Objective: Blood pressure 133/72, pulse 84, temperature 98.3 F (36.8 C), temperature source Oral, resp. rate 17, height 5' 5.5" (1.664 m), weight 74.8 kg, SpO2  90%.  Examination:  Physical Exam Constitutional:      Appearance: Normal appearance.  HENT:     Head: Normocephalic and atraumatic.     Mouth/Throat:     Mouth: Mucous membranes are moist.  Eyes:     Extraocular Movements: Extraocular movements intact.  Cardiovascular:     Rate and Rhythm: Normal rate and regular rhythm.  Pulmonary:     Effort: Pulmonary effort is normal. No respiratory distress.     Breath sounds: Normal breath sounds. No wheezing.  Abdominal:     General: Bowel  sounds are normal. There is no distension.     Palpations: Abdomen is soft.     Tenderness: There is no abdominal tenderness.  Musculoskeletal:     Cervical back: Normal range of motion and neck supple.     Comments: Right ankle noted in brace with mild edema and bruising.  Left knee immobilized in brace.  Soft thigh compartment. DP intact bilaterally  Skin:    General: Skin is warm and dry.  Neurological:     General: No focal deficit present.     Mental Status: She is alert.  Psychiatric:        Mood and Affect: Mood normal.      Consultants:  Orthopedic surgery  Procedures:    Data Reviewed: Results for orders placed or performed during the hospital encounter of 10/19/23 (from the past 24 hour(s))  Basic metabolic panel     Status: Abnormal   Collection Time: 10/20/23  3:17 AM  Result Value Ref Range   Sodium 134 (L) 135 - 145 mmol/L   Potassium 3.7 3.5 - 5.1 mmol/L   Chloride 102 98 - 111 mmol/L   CO2 23 22 - 32 mmol/L   Glucose, Bld 110 (H) 70 - 99 mg/dL   BUN 16 8 - 23 mg/dL   Creatinine, Ser 7.82 0.44 - 1.00 mg/dL   Calcium 8.7 (L) 8.9 - 10.3 mg/dL   GFR, Estimated >95 >62 mL/min   Anion gap 9 5 - 15  CBC     Status: Abnormal   Collection Time: 10/20/23  3:17 AM  Result Value Ref Range   WBC 6.8 4.0 - 10.5 K/uL   RBC 3.84 (L) 3.87 - 5.11 MIL/uL   Hemoglobin 11.4 (L) 12.0 - 15.0 g/dL   HCT 13.0 (L) 86.5 - 78.4 %   MCV 92.2 80.0 - 100.0 fL   MCH 29.7 26.0 - 34.0 pg   MCHC 32.2 30.0 - 36.0 g/dL   RDW 69.6 29.5 - 28.4 %   Platelets 230 150 - 400 K/uL   nRBC 0.0 0.0 - 0.2 %    I have reviewed pertinent nursing notes, vitals, labs, and images as necessary. I have ordered labwork to follow up on as indicated.  I have reviewed the last notes from staff over past 24 hours. I have discussed patient's care plan and test results with nursing staff, CM/SW, and other staff as appropriate.  Time spent: Greater than 50% of the 55 minute visit was spent in  counseling/coordination of care for the patient as laid out in the A&P.   LOS: 1 day   Lewie Chamber, MD [facility] 10/20/2023, 12:42 PM

## 2023-10-20 NOTE — Hospital Course (Signed)
Heather Rivera is a 72 y.o. female with medical history significant of hyperlipidemia, benign essential tremor, prediabetes, and osteopenia. She presents to [facility] ED via EMS after a mechanical fall.  Fall was a mechanical fall and was not preceded by any dizziness, change in sensorium, vision changes, or palpitations.  She denies head strike or loss of consciousness, she is not on any outpatient anticoagulants or ASA. She reports a trip over the curb when trying to get into her car and immediately noted pain and an inability to bear weight on the left lower extremity, she also had pain of the right heel but was able to bear weight, and she also has pain of the right wrist where she tried to catch herself as she fell.  She reports being able to lift herself into her car and she drove home, she then managed to get herself inside of her home and realized that she was in more pain than anticipated and called her daughter.  The daughter was unable to get her out of the home to bring her to the emergency department and they subsequently called EMS.    ED Course: On arrival to [facility] ED she was noted to be afebrile temp 37.1C, BP 118/71, HR 79, RR 18, SpO2 96% on room air. Plain films of the (L) knee and (R) ankle obtained and shows oblique fracture through the (L) lateral femoral condyle and an avulsion fracture from distal aspect of the (R) calcaneus. CT (L) knee shows vertical fracture through the distal femoral metaphysis of the femur extending into the femoral condyle adjacent to the intercondylar notch with displacement and a comminuted fracture of the lateral aspect of the patella.  After admission, she was transferred to [facility] for definitive repair with orthopedic surgery.

## 2023-10-20 NOTE — Anesthesia Preprocedure Evaluation (Signed)
Anesthesia Evaluation  Patient identified by MRN, date of birth, ID band Patient awake    Reviewed: Allergy & Precautions, NPO status , Patient's Chart, lab work & pertinent test results  History of Anesthesia Complications Negative for: history of anesthetic complications  Airway Mallampati: II  TM Distance: >3 FB Neck ROM: Full    Dental no notable dental hx.    Pulmonary former smoker   Pulmonary exam normal        Cardiovascular Pt. on home beta blockers Normal cardiovascular exam     Neuro/Psych negative neurological ROS     GI/Hepatic negative GI ROS, Neg liver ROS,,,  Endo/Other  negative endocrine ROS    Renal/GU negative Renal ROS  negative genitourinary   Musculoskeletal Left distal femur fracture   Abdominal   Peds  Hematology  (+) Blood dyscrasia (Hgb 11.4), anemia   Anesthesia Other Findings Day of surgery medications reviewed with patient.  Reproductive/Obstetrics                              Anesthesia Physical Anesthesia Plan  ASA: 2  Anesthesia Plan: General   Post-op Pain Management: Tylenol PO (pre-op)*   Induction: Intravenous  PONV Risk Score and Plan: 3 and Ondansetron, Dexamethasone and Treatment may vary due to age or medical condition  Airway Management Planned: Oral ETT  Additional Equipment: None  Intra-op Plan:   Post-operative Plan: Extubation in OR  Informed Consent: I have reviewed the patients History and Physical, chart, labs and discussed the procedure including the risks, benefits and alternatives for the proposed anesthesia with the patient or authorized representative who has indicated his/her understanding and acceptance.     Dental advisory given  Plan Discussed with: CRNA  Anesthesia Plan Comments:         Anesthesia Quick Evaluation

## 2023-10-20 NOTE — Plan of Care (Signed)
  Problem: Coping: Goal: Level of anxiety will decrease Outcome: Progressing   Problem: Pain Management: Goal: General experience of comfort will improve Outcome: Progressing

## 2023-10-20 NOTE — Progress Notes (Signed)
Ortho Trauma Note  Asked by Dr. Roda Shutters to assume care of patients fracture of her distal femur. Will plan to proceed first thing in the AM. OTS consult to occur in the morning.  Roby Lofts, MD [facility] [phone] (office) [email or web address]

## 2023-10-20 NOTE — Progress Notes (Signed)
Awaited to transfer until physician rounded and all am medications were given/updated. Report called to Tameshia LPN on 5N 14, [facility] transport set up.

## 2023-10-20 NOTE — Progress Notes (Signed)
PT Cancellation Note  Patient Details Name: Heather Rivera MRN: [id number] DOB: 1951-06-15   Cancelled Treatment:     PT order received but eval deferred this date.  Per Dr. Roda Shutters ortho note on 12/7 patient is pending transfer to [facility] for surgical intervention of left distal femur fracture and patella fx on 12/9. Therapy to continue to follow and check back after surgical intervention for updated orders.    Dazia Lippold 10/20/2023, 7:05 AM

## 2023-10-21 ENCOUNTER — Encounter (HOSPITAL_COMMUNITY)
Admission: EM | Disposition: A | Discharge status: Home Health | Payer: Self-pay | Source: Home / Self Care | Attending: Internal Medicine

## 2023-10-21 ENCOUNTER — Inpatient Hospital Stay (HOSPITAL_COMMUNITY): Payer: Self-pay | Admitting: Anesthesiology

## 2023-10-21 ENCOUNTER — Inpatient Hospital Stay (HOSPITAL_COMMUNITY): Payer: PPO

## 2023-10-21 ENCOUNTER — Other Ambulatory Visit: Payer: Self-pay

## 2023-10-21 ENCOUNTER — Encounter (HOSPITAL_COMMUNITY): Payer: Self-pay | Admitting: Internal Medicine

## 2023-10-21 DIAGNOSIS — S82002A Unspecified fracture of left patella, initial encounter for closed fracture: Secondary | ICD-10-CM

## 2023-10-21 DIAGNOSIS — S72425A Nondisplaced fracture of lateral condyle of left femur, initial encounter for closed fracture: Secondary | ICD-10-CM

## 2023-10-21 DIAGNOSIS — S92034A Nondisplaced avulsion fracture of tuberosity of right calcaneus, initial encounter for closed fracture: Secondary | ICD-10-CM

## 2023-10-21 DIAGNOSIS — W19XXXA Unspecified fall, initial encounter: Secondary | ICD-10-CM

## 2023-10-21 DIAGNOSIS — S72402Q Unspecified fracture of lower end of left femur, subsequent encounter for open fracture type I or II with malunion: Secondary | ICD-10-CM | POA: Diagnosis not present

## 2023-10-21 HISTORY — PX: ORIF FEMUR FRACTURE: SHX2119

## 2023-10-21 LAB — CBC
HCT: 34.6 % — ABNORMAL LOW (ref 36.0–46.0)
Hemoglobin: 11.7 g/dL — ABNORMAL LOW (ref 12.0–15.0)
MCH: 30.1 pg (ref 26.0–34.0)
MCHC: 33.8 g/dL (ref 30.0–36.0)
MCV: 88.9 fL (ref 80.0–100.0)
Platelets: 238 10*3/uL (ref 150–400)
RBC: 3.89 MIL/uL (ref 3.87–5.11)
RDW: 13.7 % (ref 11.5–15.5)
WBC: 8.4 10*3/uL (ref 4.0–10.5)
nRBC: 0 % (ref 0.0–0.2)

## 2023-10-21 LAB — CREATININE, SERUM
Creatinine, Ser: 0.84 mg/dL (ref 0.44–1.00)
GFR, Estimated: >60 mL/min (ref >60)

## 2023-10-21 SURGERY — OPEN REDUCTION INTERNAL FIXATION (ORIF) DISTAL FEMUR FRACTURE
Anesthesia: General | Site: Knee | Laterality: Left

## 2023-10-21 MED ORDER — ACETAMINOPHEN 10 MG/ML IV SOLN
1000.0000 mg | Freq: Once | INTRAVENOUS | Status: AC
  Administered 2023-10-21: 1000 mg via INTRAVENOUS

## 2023-10-21 MED ORDER — ORAL CARE MOUTH RINSE: 15.0000 mL | Freq: Once | OROMUCOSAL | Status: AC

## 2023-10-21 MED ORDER — 0.9 % SODIUM CHLORIDE (POUR BTL) OPTIME
TOPICAL | Status: DC | PRN
  Administered 2023-10-21: 1000 mL

## 2023-10-21 MED ORDER — ONDANSETRON HCL 4 MG PO TABS: 4.0000 mg | ORAL_TABLET | Freq: Four times a day (QID) | ORAL | Status: DC | PRN

## 2023-10-21 MED ORDER — KETOROLAC TROMETHAMINE 15 MG/ML IJ SOLN
7.5000 mg | Freq: Four times a day (QID) | INTRAMUSCULAR | Status: AC
  Administered 2023-10-21 – 2023-10-22 (×5): 7.5 mg via INTRAVENOUS
  Filled 2023-10-21 (×5): qty 1

## 2023-10-21 MED ORDER — VANCOMYCIN HCL 1000 MG IV SOLR
INTRAVENOUS | Status: DC | PRN
  Administered 2023-10-21: 1000 mg via TOPICAL

## 2023-10-21 MED ORDER — MIDAZOLAM HCL 2 MG/2ML IJ SOLN
INTRAMUSCULAR | Status: AC
  Filled 2023-10-21: qty 2

## 2023-10-21 MED ORDER — PROPOFOL 10 MG/ML IV BOLUS
INTRAVENOUS | Status: DC | PRN
  Administered 2023-10-21: 140 mg via INTRAVENOUS
  Administered 2023-10-21: 20 mg via INTRAVENOUS

## 2023-10-21 MED ORDER — DROPERIDOL 2.5 MG/ML IJ SOLN: 0.6250 mg | Freq: Once | INTRAMUSCULAR | Status: DC | PRN

## 2023-10-21 MED ORDER — OXYCODONE HCL 5 MG PO TABS: 5.0000 mg | ORAL_TABLET | Freq: Once | ORAL | Status: DC | PRN

## 2023-10-21 MED ORDER — SUGAMMADEX SODIUM 200 MG/2ML IV SOLN
INTRAVENOUS | Status: DC | PRN
  Administered 2023-10-21: 150 mg via INTRAVENOUS

## 2023-10-21 MED ORDER — ONDANSETRON HCL 4 MG/2ML IJ SOLN: 4.0000 mg | Freq: Four times a day (QID) | INTRAMUSCULAR | Status: DC | PRN

## 2023-10-21 MED ORDER — OXYCODONE HCL 5 MG/5ML PO SOLN: 5.0000 mg | Freq: Once | ORAL | Status: DC | PRN

## 2023-10-21 MED ORDER — HYDROMORPHONE HCL 1 MG/ML IJ SOLN
0.5000 mg | INTRAMUSCULAR | Status: DC | PRN
  Administered 2023-10-21: 1 mg via INTRAVENOUS
  Administered 2023-10-22 (×2): 0.5 mg via INTRAVENOUS
  Filled 2023-10-21 (×3): qty 1

## 2023-10-21 MED ORDER — CHLORHEXIDINE GLUCONATE 0.12 % MT SOLN
15.0000 mL | Freq: Once | OROMUCOSAL | Status: AC
  Administered 2023-10-21: 15 mL via OROMUCOSAL
  Filled 2023-10-21: qty 15

## 2023-10-21 MED ORDER — CEFAZOLIN SODIUM-DEXTROSE 2-3 GM-%(50ML) IV SOLR
INTRAVENOUS | Status: DC | PRN
  Administered 2023-10-21: 2 g via INTRAVENOUS

## 2023-10-21 MED ORDER — ROCURONIUM BROMIDE 10 MG/ML (PF) SYRINGE
PREFILLED_SYRINGE | INTRAVENOUS | Status: AC
  Filled 2023-10-21: qty 10

## 2023-10-21 MED ORDER — FENTANYL CITRATE (PF) 250 MCG/5ML IJ SOLN
INTRAMUSCULAR | Status: DC | PRN
  Administered 2023-10-21 (×3): 50 ug via INTRAVENOUS
  Administered 2023-10-21: 100 ug via INTRAVENOUS

## 2023-10-21 MED ORDER — FENTANYL CITRATE (PF) 100 MCG/2ML IJ SOLN
50.0000 ug | Freq: Once | INTRAMUSCULAR | Status: AC
  Administered 2023-10-21: 50 ug via INTRAVENOUS
  Filled 2023-10-21: qty 2

## 2023-10-21 MED ORDER — LACTATED RINGERS IV SOLN: INTRAVENOUS | Status: DC

## 2023-10-21 MED ORDER — PHENYLEPHRINE 80 MCG/ML (10ML) SYRINGE FOR IV PUSH (FOR BLOOD PRESSURE SUPPORT)
PREFILLED_SYRINGE | INTRAVENOUS | Status: DC | PRN
  Administered 2023-10-21 (×4): 80 ug via INTRAVENOUS

## 2023-10-21 MED ORDER — ONDANSETRON HCL 4 MG/2ML IJ SOLN
INTRAMUSCULAR | Status: DC | PRN
  Administered 2023-10-21: 4 mg via INTRAVENOUS

## 2023-10-21 MED ORDER — ENOXAPARIN SODIUM 40 MG/0.4ML IJ SOSY
40.0000 mg | PREFILLED_SYRINGE | INTRAMUSCULAR | Status: DC
  Administered 2023-10-22 – 2023-10-25 (×4): 40 mg via SUBCUTANEOUS
  Filled 2023-10-21 (×5): qty 0.4

## 2023-10-21 MED ORDER — TRANEXAMIC ACID-NACL 1000-0.7 MG/100ML-% IV SOLN
1000.0000 mg | Freq: Once | INTRAVENOUS | Status: AC
  Administered 2023-10-21: 1000 mg via INTRAVENOUS
  Filled 2023-10-21: qty 100

## 2023-10-21 MED ORDER — SODIUM CHLORIDE 0.9% FLUSH
10.0000 mL | Freq: Two times a day (BID) | INTRAVENOUS | Status: DC
  Administered 2023-10-21 – 2023-10-25 (×8): 10 mL via INTRAVENOUS

## 2023-10-21 MED ORDER — PROPOFOL 10 MG/ML IV BOLUS
INTRAVENOUS | Status: AC
Start: 2023-10-21 — End: ?
  Filled 2023-10-21: qty 20

## 2023-10-21 MED ORDER — DIPHENHYDRAMINE HCL 25 MG PO CAPS: 25.0000 mg | ORAL_CAPSULE | Freq: Four times a day (QID) | ORAL | Status: DC | PRN

## 2023-10-21 MED ORDER — ROCURONIUM BROMIDE 10 MG/ML (PF) SYRINGE
PREFILLED_SYRINGE | INTRAVENOUS | Status: DC | PRN
  Administered 2023-10-21: 60 mg via INTRAVENOUS
  Administered 2023-10-21: 20 mg via INTRAVENOUS

## 2023-10-21 MED ORDER — VANCOMYCIN HCL 1000 MG IV SOLR
INTRAVENOUS | Status: AC
  Filled 2023-10-21: qty 20

## 2023-10-21 MED ORDER — LIDOCAINE 2% (20 MG/ML) 5 ML SYRINGE
INTRAMUSCULAR | Status: AC
  Filled 2023-10-21: qty 5

## 2023-10-21 MED ORDER — FENTANYL CITRATE (PF) 100 MCG/2ML IJ SOLN
INTRAMUSCULAR | Status: AC
  Filled 2023-10-21: qty 2

## 2023-10-21 MED ORDER — ACETAMINOPHEN 10 MG/ML IV SOLN
INTRAVENOUS | Status: AC
  Filled 2023-10-21: qty 100

## 2023-10-21 MED ORDER — MIDAZOLAM HCL 2 MG/2ML IJ SOLN
INTRAMUSCULAR | Status: DC | PRN
  Administered 2023-10-21: .5 mg via INTRAVENOUS

## 2023-10-21 MED ORDER — PROPOFOL 10 MG/ML IV BOLUS
INTRAVENOUS | Status: AC
  Filled 2023-10-21: qty 20

## 2023-10-21 MED ORDER — CEFAZOLIN SODIUM-DEXTROSE 2-4 GM/100ML-% IV SOLN
2.0000 g | Freq: Three times a day (TID) | INTRAVENOUS | Status: AC
  Administered 2023-10-21 – 2023-10-22 (×3): 2 g via INTRAVENOUS
  Filled 2023-10-21 (×3): qty 100

## 2023-10-21 MED ORDER — CEFAZOLIN SODIUM 1 G IJ SOLR
INTRAMUSCULAR | Status: AC
  Filled 2023-10-21: qty 20

## 2023-10-21 MED ORDER — LIDOCAINE 2% (20 MG/ML) 5 ML SYRINGE
INTRAMUSCULAR | Status: DC | PRN
  Administered 2023-10-21: 100 mg via INTRAVENOUS

## 2023-10-21 MED ORDER — FENTANYL CITRATE (PF) 250 MCG/5ML IJ SOLN
INTRAMUSCULAR | Status: AC
  Filled 2023-10-21: qty 5

## 2023-10-21 MED ORDER — ONDANSETRON HCL 4 MG/2ML IJ SOLN
INTRAMUSCULAR | Status: AC
  Filled 2023-10-21: qty 2

## 2023-10-21 MED ORDER — DOCUSATE SODIUM 100 MG PO CAPS
100.0000 mg | ORAL_CAPSULE | Freq: Two times a day (BID) | ORAL | Status: DC
  Administered 2023-10-21 – 2023-10-25 (×8): 100 mg via ORAL
  Filled 2023-10-21 (×9): qty 1

## 2023-10-21 MED ORDER — PHENYLEPHRINE 80 MCG/ML (10ML) SYRINGE FOR IV PUSH (FOR BLOOD PRESSURE SUPPORT)
PREFILLED_SYRINGE | INTRAVENOUS | Status: AC
  Filled 2023-10-21: qty 10

## 2023-10-21 MED ORDER — METOCLOPRAMIDE HCL 5 MG/ML IJ SOLN
5.0000 mg | Freq: Three times a day (TID) | INTRAMUSCULAR | Status: DC | PRN
Start: 2023-10-21 — End: 2023-10-25

## 2023-10-21 MED ORDER — FENTANYL CITRATE (PF) 100 MCG/2ML IJ SOLN
25.0000 ug | INTRAMUSCULAR | Status: DC | PRN
  Administered 2023-10-21 (×3): 50 ug via INTRAVENOUS

## 2023-10-21 MED ORDER — METOCLOPRAMIDE HCL 5 MG PO TABS: 5.0000 mg | ORAL_TABLET | Freq: Three times a day (TID) | ORAL | Status: DC | PRN

## 2023-10-21 SURGICAL SUPPLY — 62 items
BAG COUNTER SPONGE SURGICOUNT (BAG) ×1 IMPLANT
BIT DRILL LONG 3.3 (BIT) IMPLANT
BIT DRILL QC 3.3X195 (BIT) IMPLANT
BLADE CLIPPER SURG (BLADE) IMPLANT
BNDG COHESIVE 6X5 TAN ST LF (GAUZE/BANDAGES/DRESSINGS) ×1 IMPLANT
BNDG ELASTIC 4INX 5YD STR LF (GAUZE/BANDAGES/DRESSINGS) IMPLANT
BNDG ELASTIC 6INX 5YD STR LF (GAUZE/BANDAGES/DRESSINGS) IMPLANT
BNDG ELASTIC 6X10 VLCR STRL LF (GAUZE/BANDAGES/DRESSINGS) ×1 IMPLANT
BRUSH SCRUB EZ PLAIN DRY (MISCELLANEOUS) ×2 IMPLANT
CANISTER SUCT 3000ML PPV (MISCELLANEOUS) ×1 IMPLANT
CAP LOCK NCB (Cap) IMPLANT
CHLORAPREP W/TINT 26 (MISCELLANEOUS) ×1 IMPLANT
COVER SURGICAL LIGHT HANDLE (MISCELLANEOUS) ×1 IMPLANT
DERMABOND ADVANCED .7 DNX12 (GAUZE/BANDAGES/DRESSINGS) IMPLANT
DRAPE C-ARM 42X72 X-RAY (DRAPES) ×1 IMPLANT
DRAPE C-ARMOR (DRAPES) ×1 IMPLANT
DRAPE HALF SHEET 40X57 (DRAPES) ×2 IMPLANT
DRAPE SURG 17X23 STRL (DRAPES) ×1 IMPLANT
DRAPE SURG ORHT 6 SPLT 77X108 (DRAPES) ×2 IMPLANT
DRAPE U-SHAPE 47X51 STRL (DRAPES) ×1 IMPLANT
DRESSING MEPILEX FLEX 4X4 (GAUZE/BANDAGES/DRESSINGS) IMPLANT
DRSG ADAPTIC 3X8 NADH LF (GAUZE/BANDAGES/DRESSINGS) IMPLANT
DRSG MEPILEX FLEX 4X4 (GAUZE/BANDAGES/DRESSINGS)
DRSG MEPILEX POST OP 4X12 (GAUZE/BANDAGES/DRESSINGS) IMPLANT
DRSG MEPILEX POST OP 4X8 (GAUZE/BANDAGES/DRESSINGS) IMPLANT
ELECT REM PT RETURN 9FT ADLT (ELECTROSURGICAL) ×1
ELECTRODE REM PT RTRN 9FT ADLT (ELECTROSURGICAL) ×1 IMPLANT
GAUZE PAD ABD 8X10 STRL (GAUZE/BANDAGES/DRESSINGS) ×3 IMPLANT
GAUZE SPONGE 4X4 12PLY STRL (GAUZE/BANDAGES/DRESSINGS) ×1 IMPLANT
GLOVE BIO SURGEON STRL SZ 6.5 (GLOVE) ×3 IMPLANT
GLOVE BIO SURGEON STRL SZ7.5 (GLOVE) ×4 IMPLANT
GLOVE BIOGEL PI IND STRL 6.5 (GLOVE) ×1 IMPLANT
GLOVE BIOGEL PI IND STRL 7.5 (GLOVE) ×1 IMPLANT
GOWN STRL REUS W/ TWL LRG LVL3 (GOWN DISPOSABLE) ×3 IMPLANT
K-WIRE FXSTD 280X2XNS SS (WIRE) ×2
KIT BASIN OR (CUSTOM PROCEDURE TRAY) ×1 IMPLANT
KIT TURNOVER KIT B (KITS) ×1 IMPLANT
KWIRE FXSTD 280X2XNS SS (WIRE) IMPLANT
NS IRRIG 1000ML POUR BTL (IV SOLUTION) ×1 IMPLANT
PACK TOTAL JOINT (CUSTOM PROCEDURE TRAY) ×1 IMPLANT
PAD ARMBOARD 7.5X6 YLW CONV (MISCELLANEOUS) ×2 IMPLANT
PAD CAST 4YDX4 CTTN HI CHSV (CAST SUPPLIES) ×1 IMPLANT
PADDING CAST COTTON 6X4 STRL (CAST SUPPLIES) ×1 IMPLANT
PLATE BONE LOCK 238MM 9HOLE (Plate) IMPLANT
SCREW NCB 4.0MX38M (Screw) IMPLANT
SCREW NCB 4.0MX42M (Screw) IMPLANT
SCREW NCB 4.0X36MM (Screw) IMPLANT
SCREW NCB 4.0X75 CORT S/T (Screw) IMPLANT
SCREW NCB 4X3 4X70 (Screw) IMPLANT
SCREW PROX ST NCB 4X80 (Screw) ×1 IMPLANT
SPONGE T-LAP 18X18 ~~LOC~~+RFID (SPONGE) IMPLANT
STAPLER VISISTAT 35W (STAPLE) ×1 IMPLANT
SUCTION TUBE FRAZIER 10FR DISP (SUCTIONS) ×1 IMPLANT
SUT ETHILON 3 0 PS 1 (SUTURE) ×2 IMPLANT
SUT MNCRL AB 3-0 PS2 18 (SUTURE) IMPLANT
SUT MON AB 2-0 CT1 36 (SUTURE) IMPLANT
SUT VIC AB 0 CT1 27XBRD ANBCTR (SUTURE) IMPLANT
SUT VIC AB 1 CT1 27XBRD ANBCTR (SUTURE) IMPLANT
SUT VIC AB 2-0 CT1 TAPERPNT 27 (SUTURE) ×2 IMPLANT
TOWEL GREEN STERILE (TOWEL DISPOSABLE) ×2 IMPLANT
TRAY FOLEY MTR SLVR 16FR STAT (SET/KITS/TRAYS/PACK) IMPLANT
WATER STERILE IRR 1000ML POUR (IV SOLUTION) ×2 IMPLANT

## 2023-10-21 NOTE — Op Note (Addendum)
Orthopaedic Surgery Operative Note (CSN: [id number] ) Date of Surgery: 10/21/2023  Admit Date: 10/19/2023   Diagnoses: Pre-Op Diagnoses: Left lateral femoral condyle fracture Left lateral patella fracture  Post-Op Diagnosis: Same  Procedures: CPT 27514-Open reduction internal fixation of left lateral femoral condyle CPT 27520-Nonoperative management of left patella fracture  Surgeons : Primary: Roby Lofts, MD  Assistant: Ulyses Southward, PA-C  Location: OR 3   Anesthesia: General   Antibiotics: Ancef 2g preop with 1 gm vancomycin powder placed topically   Tourniquet time: None    Estimated Blood Loss: 100 mL  Complications: None  Specimens:* No specimens in log *   Implants: Implant Name Type Inv. Item Serial No. Manufacturer Lot No. LRB No. Used Action  PLATE BONE LOCK 9HOLE - [id number] Plate PLATE BONE LOCK 9HOLE  [employer](ORTH,TRAU,BIO,SG) [id number] Left 1 Implanted  CAP LOCK NCB - [id number] Cap CAP LOCK NCB  [employer](ORTH,TRAU,BIO,SG) ON TRAY Left 5 Implanted  SCREW NCB 4.0X75 CORT S/T - [id number] Screw SCREW NCB 4.0X75 CORT S/T  [employer](ORTH,TRAU,BIO,SG) ON TRAY Left 3 Implanted  SCREW PROX ST NCB 4X80 - [id number] Screw SCREW PROX ST NCB 4X80  [employer](ORTH,TRAU,BIO,SG) ON TRAY Left 1 Implanted  SCREW NCB 4.0MX42M - [id number] Screw SCREW NCB 4.0MX42M  ZIMMER [employer]ON(ORTH,TRAU,BIO,SG) ON TRAY Left 1 Implanted  SCREW NCB 4.0MX38M - [id number] Screw SCREW NCB 4.0MX38M  ZIMMER [employer]ON(ORTH,TRAU,BIO,SG) ON TRAY Left 1 Implanted  SCREW NCB 4.0X36MM - [id number] Screw SCREW NCB 4.0X36MM  ZIMMER [employer]ON(ORTH,TRAU,BIO,SG) ON TRAY Left 1 Implanted  SCREW NCB 4X3 4X70 - [id number] Screw SCREW NCB 4X3 4X70  ZIMMER [employer]ON(ORTH,TRAU,BIO,SG) ON TRAY Left 1 Implanted     Indications for Surgery: 72 year old female who presents with a lateral femoral condyle fracture and a lateral patellar fracture.  Due to the unstable nature of her injury I recommend  proceeding with open reduction internal fixation of her femur.  Risks and benefits were discussed with the patient.  Risks include but not limited to bleeding, infection, malunion, nonunion, hardware failure, hardware irritation, nerve and blood vessel injury, posttraumatic arthritis, knee stiffness, even the possibility anesthetic complications.  She agreed to proceed with surgery and consent was obtained.  Operative Findings: 1.  Open reduction internal fixation of lateral femoral condyle using [employer] NCB 9 hole distal femoral locking plate 2.  Stable lateral patella fracture without any articular instability treated with nonoperative management.  Procedure: The patient was identified in the preoperative holding area. Consent was confirmed with the patient and their family and all questions were answered. The operative extremity was marked after confirmation with the patient. she was then brought back to the operating room by our anesthesia colleagues.  She was placed under general anesthetic and carefully transferred over to radiolucent flattop table.  A bump was placed under her operative hip.  The left lower extremity was then prepped and draped in usual sterile fashion.  A timeout was performed to verify the patient, the procedure, and the extremity.  Preoperative antibiotics were dosed.  The hip and knee were flexed over a triangle and fluoroscopic imaging showed the unstable nature of her injury.  A lateral parapatellar approach was then made and carried down through skin and subcutaneous tissue.  I then incised through the IT band and into the capsule to visualize the articular surface.  There was a clear step-off of the lateral condyle.  I then worked to clean out the hematoma and used a Clinical biochemist to free the condylar fragment.  I was then able to manipulate the fragment back into anatomic reduction and then I used a reduction tenaculum to hold it in place.  I then placed a 2.0 mm  guidewire from lateral to medial and brought out the medial skin to hold the fragment.  I then placed a 4.0 mm independent screw to hold the fragment as well while I remove the clamp.  I then chose a 9 hole [employer]immer [employer] NCB distal femoral locking plate and slid this submuscularly along the lateral cortex of the femur attached to a targeting arm.  I held it distally in position with a 2.0 mm K wire and proximally at the aligned the plate with a 3.3 mm drill bit through the targeting arm.  I then placed a 4.0 millimeter screws distally to bring the plate flush to bone I then percutaneously placed 4.0 millimeter screws in the femoral shaft.  A total of 3 screws were placed in the femoral shaft and the targeting arm was removed.  I then returned to the distal segment and placed a total of five 4.0 millimeter screws.  Locking caps were placed all the screws and final fluoroscopic imaging was obtained.  I palpated the lateral patella fracture was which was just the rim of the patella which did not have any instability and I felt that fixation was not warranted.  The incision was copiously irrigated.  A gram of vancomycin powder was placed into the incision.  A layered closure of 0 Vicryl, 2-0 Monocryl and 3-0 Monocryl with Dermabond was used to close the skin.  Sterile dressings were applied.  The patient was then awoke from anesthesia and taken to the PACU in stable condition.  Post Op Plan/Instructions: The patient will be touchdown weightbearing to the left lower extremity.  She will have unrestricted range of motion of the knee.  She will receive Lovenox for DVT prophylaxis while inpatient and discharged on aspirin 325 mg.  Will have her mobilize with physical and Occupational Therapy.  I was present and performed the entire surgery.  Ulyses Southward, PA-C did assist me throughout the case. An assistant was necessary given the difficulty in approach, maintenance of reduction and ability to instrument the  fracture.   Truitt Merle, MD [facility]

## 2023-10-21 NOTE — Anesthesia Procedure Notes (Addendum)
Procedure Name: Intubation Date/Time: 10/21/2023 7:41 AM  Performed by: Ayesha Rumpf, CRNAPre-anesthesia Checklist: Patient identified, Emergency Drugs available, Suction available and Patient being monitored Patient Re-evaluated:Patient Re-evaluated prior to induction Oxygen Delivery Method: Circle System Utilized Preoxygenation: Pre-oxygenation with 100% oxygen Induction Type: IV induction Ventilation: Mask ventilation with difficulty and Two handed mask ventilation required Laryngoscope Size: Mac and 3 Grade View: Grade III Tube type: Oral Tube size: 7.0 mm Number of attempts: 1 Airway Equipment and Method: Stylet and Oral airway Placement Confirmation: ETT inserted through vocal cords under direct vision, positive ETCO2 and breath sounds checked- equal and bilateral Secured at: 22 cm Tube secured with: Tape Dental Injury: Teeth and Oropharynx as per pre-operative assessment  Difficulty Due To: Difficult Airway- due to anterior larynx

## 2023-10-21 NOTE — Transfer of Care (Signed)
Immediate Anesthesia Transfer of Care Note  Patient: Heather Rivera  Procedure(s) Performed: OPEN REDUCTION INTERNAL FIXATION (ORIF) DISTAL FEMUR FRACTURE (Left: Knee)  Patient Location: PACU  Anesthesia Type:General  Level of Consciousness: drowsy, patient cooperative, and responds to stimulation  Airway & Oxygen Therapy: Patient Spontanous Breathing and Patient connected to face mask oxygen  Post-op Assessment: Report given to RN and Post -op Vital signs reviewed and stable  Post vital signs: Reviewed and stable  Last Vitals:  Vitals Value Taken Time  BP    Temp    Pulse 93 10/21/23 0908  Resp    SpO2 100 % 10/21/23 0908  Vitals shown include unfiled device data.  Last Pain:  Vitals:   10/21/23 0703  TempSrc:   PainSc: 5       Patients Stated Pain Goal: 1 (10/21/23 0703)  Complications: No notable events documented.

## 2023-10-21 NOTE — Progress Notes (Signed)
Patient to OR

## 2023-10-21 NOTE — Anesthesia Postprocedure Evaluation (Signed)
Anesthesia Post Note  Patient: Heather Rivera  Procedure(s) Performed: OPEN REDUCTION INTERNAL FIXATION (ORIF) DISTAL FEMUR FRACTURE (Left: Knee)     Patient location during evaluation: PACU Anesthesia Type: General Level of consciousness: awake and alert Pain management: pain level controlled Vital Signs Assessment: post-procedure vital signs reviewed and stable Respiratory status: spontaneous breathing, nonlabored ventilation and respiratory function stable Cardiovascular status: blood pressure returned to baseline Postop Assessment: no apparent nausea or vomiting Anesthetic complications: no   No notable events documented.          Shanda Howells

## 2023-10-21 NOTE — Plan of Care (Signed)
  Problem: Education: Goal: Knowledge of General Education information will improve Description: Including pain rating scale, medication(s)/side effects and non-pharmacologic comfort measures Outcome: Progressing   Problem: Clinical Measurements: Goal: Ability to maintain clinical measurements within normal limits will improve Outcome: Progressing Goal: Will remain free from infection Outcome: Progressing Goal: Cardiovascular complication will be avoided Outcome: Progressing   Problem: Activity: Goal: Risk for activity intolerance will decrease Outcome: Progressing   Problem: Nutrition: Goal: Adequate nutrition will be maintained Outcome: Progressing   Problem: Coping: Goal: Level of anxiety will decrease Outcome: Progressing   Problem: Elimination: Goal: Will not experience complications related to bowel motility Outcome: Progressing   Problem: Pain Management: Goal: General experience of comfort will improve Outcome: Progressing   Problem: Safety: Goal: Ability to remain free from injury will improve Outcome: Progressing   Problem: Skin Integrity: Goal: Risk for impaired skin integrity will decrease Outcome: Progressing

## 2023-10-21 NOTE — Progress Notes (Signed)
PT Cancellation Note  Patient Details Name: Heather Rivera MRN: [id number] DOB: 1951-07-07   Cancelled Treatment:    Reason Eval/Treat Not Completed: Patient at procedure or test/unavailable (OR)   Dametra Whetsel B Misbah Hornaday 10/21/2023, 7:47 AM Merryl Hacker, PT Acute Rehabilitation Services Office: [phone]

## 2023-10-21 NOTE — H&P (View-Only) (Signed)
Orthopaedic Trauma Service (OTS) Consult   Patient ID: Heather Rivera MRN: [id number] DOB/AGE: 1951-06-12 72 y.o.  Reason for Consult:Left distal femur fracture Referring Physician: Dr. Glee Arvin, MD Heather Rivera  HPI: Heather Rivera is an 72 y.o. female who is being seen in consultation at the request of Dr. Roda Rivera for evaluation of left intra-articular distal femur fracture and left patella fracture.  Patient had a mechanical fall landing on her leg.  She tripped and she eventually drove home and crawled up the stairs where she was in significant amount of pain and she went to the emergency room.  X-rays and CT scan showed an intra-articular lateral femoral condyle fracture and a left patella fracture as well as a potential avulsion of the calcaneus on the right side.  Due to the complexity of the injury it was felt that orthopedic trauma would be appropriate to manage the patient.  Patient was seen and evaluated in preoperative holding area.  Currently comfortable.  She works at a hotel.  She ambulates without assist device.  She has 2 daughters that are local and in the area.  Denies any major medical problems and denies any diabetes or heart disease.  Past Medical History:  Diagnosis Date   ALLERGIC RHINITIS    Essential tremor    Female pattern baldness    OBESITY     Past Surgical History:  Procedure Laterality Date   BREAST BIOPSY Left 12/20/2022   MM LT BREAST BX W LOC DEV 1ST LESION IMAGE BX SPEC STEREO GUIDE 12/20/2022 [facility]   CESAREAN SECTION  1982   glass removed from finger  2010    Family History  Problem Relation Age of Onset   Alcohol abuse Mother    Arthritis Mother        grandparent   COPD Mother    Diabetes Mother    Parkinson's disease Father    Heart disease Brother    Diabetes Maternal Grandmother    Healthy Child     Social History:  reports that she quit smoking about 44 years ago. Her smoking use included cigarettes. She has never used smokeless  tobacco. She reports that she does not currently use alcohol. She reports that she does not use drugs.  Allergies:  Allergies  Allergen Reactions   Augmentin [Amoxicillin-Pot Clavulanate] Nausea And Vomiting, Rash and Other (See Comments)    Rash, N/V    Medications:  No current facility-administered medications on file prior to encounter.   Current Outpatient Medications on File Prior to Encounter  Medication Sig Dispense Refill   ASHWAGANDHA PO Take 1 capsule by mouth daily.     fluticasone (FLONASE) 50 MCG/ACT nasal spray Place 2 sprays into both nostrils daily. 16 g 6   loratadine (CLARITIN) 10 MG tablet Take 10 mg by mouth daily.     Magnesium 100 MG CAPS Take 200 mg by mouth daily.     montelukast (SINGULAIR) 10 MG tablet TAKE 1 TABLET BY MOUTH EVERYDAY AT BEDTIME (Patient taking differently: Take 10 mg by mouth at bedtime.) 90 tablet 3   Probiotic Product (PROBIOTIC DAILY PO) Take 1 tablet by mouth daily.     propranolol (INDERAL) 20 MG tablet TAKE 1 TABLET BY MOUTH 2 TIMES DAILY AS NEEDED. (Patient taking differently: Take 40 mg by mouth daily in the afternoon.) 180 tablet 1   rosuvastatin (CRESTOR) 10 MG tablet Take 1 tablet (10 mg total) by mouth daily. 90 tablet 3   VITAMIN D, CHOLECALCIFEROL, PO Take 1  tablet by mouth daily.       ROS: Constitutional: No fever or chills Vision: No changes in vision ENT: No difficulty swallowing CV: No chest pain Pulm: No SOB or wheezing GI: No nausea or vomiting GU: No urgency or inability to hold urine Skin: No poor wound healing Neurologic: No numbness or tingling Psychiatric: No depression or anxiety Heme: No bruising Allergic: No reaction to medications or food   Exam: Blood pressure (!) 147/84, pulse 90, temperature 98.3 F (36.8 C), temperature source Oral, resp. rate 18, height 5' 5.5" (1.664 m), weight 77.1 kg, SpO2 92%. General: No acute distress Orientation: Awake alert and oriented x 3 Mood and Affect: Cooperative  and pleasant Gait: Unable to assess due to her fracture Coordination and balance: Within normal limits  Left lower extremity: Knee immobilizer in place.  The skin is clean and dry and intact.  Compartments are soft compressible.  She has active dorsiflexion plantarflexion of her foot and ankle she is warm well-perfused foot.  She has 2+ DP pulses with brisk cap refill.  Right lower extremity: Skin without lesions. No tenderness to palpation.  She does have a ankle brace in place that I did not take off for evaluation.  Full painless ROM, full strength in each muscle groups without evidence of instability.   Medical Decision Making: Data: Imaging: X-ray and CT scan of the left knee show a comminuted patella fracture that is nondisplaced as well as a intra-articular lateral femoral condyle with a slight amount of step-off to the articular surface.  Labs:  Results for orders placed or performed during the hospital encounter of 10/19/23 (from the past 24 hour(s))  Surgical pcr screen     Status: None   Collection Time: 10/20/23 10:13 PM   Specimen: Nasal Mucosa; Nasal Swab  Result Value Ref Range   MRSA, PCR NEGATIVE NEGATIVE   Staphylococcus aureus NEGATIVE NEGATIVE     Imaging or Labs ordered: None  Medical history and chart was reviewed and case discussed with medical provider.  Assessment/Plan: 72 year old female with left patella and intra-articular lateral femoral condyle fracture  Patient require open reduction internal fixation of her left distal femur.  Risks and benefits were discussed with the patient.  Risks include but not limited to bleeding, infection, malunion, nonunion, hardware failure, hardware rotation, nerve or blood vessel injury, DVT, even the possibility anesthetic complications.  We also discussed the possibility of knee stiffness and posttraumatic arthritis.  I feel that the patella is likely nonoperative and management.  All risks were discussed as well as  postoperative recovery and the patient agreed to proceed with surgery and consent was obtained.  Heather Lofts, MD [facility] [phone] (office) [email or web address]

## 2023-10-21 NOTE — Progress Notes (Signed)
[facility]                                                                              [place], is a 72 y.o. female, DOB - 12-06-1950, ZOX:[id number] Admit date - 10/19/2023    Outpatient Primary MD for the patient is Lawerance Bach, Bobette Mo, MD  LOS - 2  days  Chief Complaint  Patient presents with   Fall       Brief summary   Patient is a 72 year old female with hyperlipidemia, benign essential tremor, prediabetes, osteopenia presented initially to [facility], ED after mechanical fall, with no syncopal episode.  She reported tripping over a curb when trying to get into her car and immediately noted pain and inability to bear weight on the left lower extremity, pain in the right heel and pain in the right wrist while she tried to catch herself as she fell.  She reported being able to lift herself into her car and drove home and noted that she was in more pain than anticipated.  EMS was called. Plain films of the (L) knee and (R) ankle obtained and shows oblique fracture through the (L) lateral femoral condyle and an avulsion fracture from distal aspect of the (R) calcaneus. CT (L) knee shows vertical fracture through the distal femoral metaphysis of the femur extending into the femoral condyle adjacent to the intercondylar notch with displacement and a comminuted fracture of the lateral aspect of the patella.   Orthopedics was consulted.  Patient was transferred to [facility] for surgery.  Assessment & Plan    Principal Problem: Left lateral femoral condyle fracture -Avulsion fracture from distal aspect of the calcaneus -orthopedics consulted -Underwent open reduction internal fixation of the left lateral femoral condyle today -Seen postsurgery, alert and oriented, no acute complaints. -Resume diet, pain control, DVT prophylaxis -Start PT OT tomorrow  Active Problems: Left lateral comminuted, nondisplaced patella fracture -Orthopedics following, recommended  nonoperative management of left patella fracture -PT OT evaluation tomorrow -Knee immobilizer in place    Benign essential tremor -Continue propranolol    HLD (hyperlipidemia) -Continue Crestor   Estimated body mass index is 27.86 kg/m as calculated from the following:   Height as of this encounter: 5' 5.5" (1.664 m).   Weight as of this encounter: 77.1 kg.  Code Status: Full CODE STATUS DVT Prophylaxis:  enoxaparin (LOVENOX) injection 40 mg Start: 10/22/23 0800 SCDs Start: 10/21/23 0956 SCDs Start: 10/19/23 0758   Level of Care: Level of care: Med-Surg Family Communication: Updated patient's daughter on the phone Disposition Plan:      Remains inpatient appropriate:      Procedures:  10/21/2023 open reduction internal fixation of left lateral femoral condyle  Consultants:   Orthopedics  Antimicrobials:   Anti-infectives (From admission, onward)    Start     Dose/Rate Route Frequency Ordered Stop   10/21/23 1400  ceFAZolin (ANCEF) IVPB 2g/100 mL premix        2 g 200 mL/hr over 30 Minutes Intravenous Every 8 hours 10/21/23 0955 10/22/23 1359   10/21/23 0841  vancomycin (VANCOCIN) powder  Status:  Discontinued  As needed 10/21/23 0841 10/21/23 0905          Medications  acetaminophen  1,000 mg Oral QID   acidophilus  1 capsule Oral Daily   cholecalciferol  400 Units Oral Daily   docusate sodium  100 mg Oral BID   [START ON 10/22/2023] enoxaparin (LOVENOX) injection  40 mg Subcutaneous Q24H   fentaNYL       fentaNYL       fluticasone  2 spray Each Nare Daily   ketorolac  7.5 mg Intravenous Q6H   loratadine  10 mg Oral Daily   magnesium oxide  200 mg Oral QPM   montelukast  10 mg Oral QHS   propranolol  40 mg Oral Q1500   rosuvastatin  10 mg Oral Daily   sodium chloride flush  10 mL Intravenous Q12H      Subjective:   Heather Rivera was seen and examined today.  Seen after the surgery, feeling cold, pain and somewhat anxious.  No acute chest  pain, shortness of breath, fevers or chills, nausea or vomiting.  Objective:   Vitals:   10/21/23 0930 10/21/23 0945 10/21/23 0958 10/21/23 1000  BP: (!) 153/81 132/66 (!) 152/82   Pulse: 81 85 88   Resp: 15 15 18    Temp:  98.1 F (36.7 C) 98.6 F (37 C)   TempSrc:   Oral   SpO2: 98% 98% (!) 86% 96%  Weight:      Height:        Intake/Output Summary (Last 24 hours) at 10/21/2023 1121 Last data filed at 10/21/2023 0901 Gross per 24 hour  Intake 960 ml  Output 700 ml  Net 260 ml     Wt Readings from Last 3 Encounters:  10/21/23 77.1 kg  06/18/23 77.1 kg  05/06/23 74.8 kg     Exam General: Alert and oriented x 3, NAD Cardiovascular: S1 S2 auscultated,  RRR Respiratory: CTAB Gastrointestinal: Soft, nontender, nondistended, + bowel sounds Ext: no pedal edema bilaterally Neuro: able to wiggle her toes, nonfocal Psych: anxious    Data Reviewed:  I have personally reviewed following labs    CBC Lab Results  Component Value Date   WBC 8.4 10/21/2023   RBC 3.89 10/21/2023   HGB 11.7 (L) 10/21/2023   HCT 34.6 (L) 10/21/2023   MCV 88.9 10/21/2023   MCH 30.1 10/21/2023   PLT 238 10/21/2023   MCHC 33.8 10/21/2023   RDW 13.7 10/21/2023   LYMPHSABS 1.4 10/19/2023   MONOABS 0.6 10/19/2023   EOSABS 0.0 10/19/2023   BASOSABS 0.0 10/19/2023     Last metabolic panel Lab Results  Component Value Date   NA 134 (L) 10/20/2023   K 3.7 10/20/2023   CL 102 10/20/2023   CO2 23 10/20/2023   BUN 16 10/20/2023   CREATININE 0.68 10/20/2023   GLUCOSE 110 (H) 10/20/2023   GFRNONAA >60 10/20/2023   CALCIUM 8.7 (L) 10/20/2023   PROT 7.2 06/19/2023   ALBUMIN 4.3 06/19/2023   BILITOT 0.7 06/19/2023   ALKPHOS 41 06/19/2023   AST 17 06/19/2023   ALT 19 06/19/2023   ANIONGAP 9 10/20/2023    CBG (last 3)  No results for input(s): "GLUCAP" in the last 72 hours.    Coagulation Profile: No results for input(s): "INR", "PROTIME" in the last 168 hours.   Radiology  Studies: I have personally reviewed the imaging studies  DG C-Arm 1-60 Min-No Report  Result Date: 10/21/2023 Fluoroscopy was utilized by the requesting physician.  No radiographic interpretation.       Thad Ranger M.D. [facility] 10/21/2023, 11:21 AM  Available via Epic secure chat 7am-7pm After 7 pm, please refer to night coverage provider listed on amion.

## 2023-10-21 NOTE — Interval H&P Note (Signed)
History and Physical Interval Note:  10/21/2023 7:26 AM  Heather Rivera  has presented today for surgery, with the diagnosis of Left distal femur fracture.  The various methods of treatment have been discussed with the patient and family. After consideration of risks, benefits and other options for treatment, the patient has consented to  Procedure(s): OPEN REDUCTION INTERNAL FIXATION (ORIF) DISTAL FEMUR FRACTURE (Left) as a surgical intervention.  The patient's history has been reviewed, patient examined, no change in status, stable for surgery.  I have reviewed the patient's chart and labs.  Questions were answered to the patient's satisfaction.     Caryn Bee P Diany Formosa

## 2023-10-21 NOTE — Progress Notes (Signed)
OT Cancellation Note  Patient Details Name: Heather Rivera MRN: [id number] DOB: [birth year]   Cancelled Treatment:    Reason Eval/Treat Not Completed: Patient not medically ready (surgery today. Will follow up tomorrow and assess as appropriate.)  Chukwudi Ewen,HILLARY 10/21/2023, 12:20 PM Luisa Dago, OT/L   Acute OT Clinical Specialist Acute Rehabilitation Services Pager [phone] Office [phone]

## 2023-10-21 NOTE — Plan of Care (Signed)

## 2023-10-21 NOTE — Consult Note (Signed)
Orthopaedic Trauma Service (OTS) Consult   Patient ID: Heather Rivera MRN: [id number] DOB/AGE: 1951-06-12 72 y.o.  Reason for Consult:Left distal femur fracture Referring Physician: Dr. Glee Arvin, MD Cyndia Skeeters  HPI: Heather Rivera is an 72 y.o. female who is being seen in consultation at the request of Dr. Roda Shutters for evaluation of left intra-articular distal femur fracture and left patella fracture.  Patient had a mechanical fall landing on her leg.  She tripped and she eventually drove home and crawled up the stairs where she was in significant amount of pain and she went to the emergency room.  X-rays and CT scan showed an intra-articular lateral femoral condyle fracture and a left patella fracture as well as a potential avulsion of the calcaneus on the right side.  Due to the complexity of the injury it was felt that orthopedic trauma would be appropriate to manage the patient.  Patient was seen and evaluated in preoperative holding area.  Currently comfortable.  She works at a hotel.  She ambulates without assist device.  She has 2 daughters that are local and in the area.  Denies any major medical problems and denies any diabetes or heart disease.  Past Medical History:  Diagnosis Date   ALLERGIC RHINITIS    Essential tremor    Female pattern baldness    OBESITY     Past Surgical History:  Procedure Laterality Date   BREAST BIOPSY Left 12/20/2022   MM LT BREAST BX W LOC DEV 1ST LESION IMAGE BX SPEC STEREO GUIDE 12/20/2022 [facility]   CESAREAN SECTION  1982   glass removed from finger  2010    Family History  Problem Relation Age of Onset   Alcohol abuse Mother    Arthritis Mother        grandparent   COPD Mother    Diabetes Mother    Parkinson's disease Father    Heart disease Brother    Diabetes Maternal Grandmother    Healthy Child     Social History:  reports that she quit smoking about 44 years ago. Her smoking use included cigarettes. She has never used smokeless  tobacco. She reports that she does not currently use alcohol. She reports that she does not use drugs.  Allergies:  Allergies  Allergen Reactions   Augmentin [Amoxicillin-Pot Clavulanate] Nausea And Vomiting, Rash and Other (See Comments)    Rash, N/V    Medications:  No current facility-administered medications on file prior to encounter.   Current Outpatient Medications on File Prior to Encounter  Medication Sig Dispense Refill   ASHWAGANDHA PO Take 1 capsule by mouth daily.     fluticasone (FLONASE) 50 MCG/ACT nasal spray Place 2 sprays into both nostrils daily. 16 g 6   loratadine (CLARITIN) 10 MG tablet Take 10 mg by mouth daily.     Magnesium 100 MG CAPS Take 200 mg by mouth daily.     montelukast (SINGULAIR) 10 MG tablet TAKE 1 TABLET BY MOUTH EVERYDAY AT BEDTIME (Patient taking differently: Take 10 mg by mouth at bedtime.) 90 tablet 3   Probiotic Product (PROBIOTIC DAILY PO) Take 1 tablet by mouth daily.     propranolol (INDERAL) 20 MG tablet TAKE 1 TABLET BY MOUTH 2 TIMES DAILY AS NEEDED. (Patient taking differently: Take 40 mg by mouth daily in the afternoon.) 180 tablet 1   rosuvastatin (CRESTOR) 10 MG tablet Take 1 tablet (10 mg total) by mouth daily. 90 tablet 3   VITAMIN D, CHOLECALCIFEROL, PO Take 1  tablet by mouth daily.       ROS: Constitutional: No fever or chills Vision: No changes in vision ENT: No difficulty swallowing CV: No chest pain Pulm: No SOB or wheezing GI: No nausea or vomiting GU: No urgency or inability to hold urine Skin: No poor wound healing Neurologic: No numbness or tingling Psychiatric: No depression or anxiety Heme: No bruising Allergic: No reaction to medications or food   Exam: Blood pressure (!) 147/84, pulse 90, temperature 98.3 F (36.8 C), temperature source Oral, resp. rate 18, height 5' 5.5" (1.664 m), weight 77.1 kg, SpO2 92%. General: No acute distress Orientation: Awake alert and oriented x 3 Mood and Affect: Cooperative  and pleasant Gait: Unable to assess due to her fracture Coordination and balance: Within normal limits  Left lower extremity: Knee immobilizer in place.  The skin is clean and dry and intact.  Compartments are soft compressible.  She has active dorsiflexion plantarflexion of her foot and ankle she is warm well-perfused foot.  She has 2+ DP pulses with brisk cap refill.  Right lower extremity: Skin without lesions. No tenderness to palpation.  She does have a ankle brace in place that I did not take off for evaluation.  Full painless ROM, full strength in each muscle groups without evidence of instability.   Medical Decision Making: Data: Imaging: X-ray and CT scan of the left knee show a comminuted patella fracture that is nondisplaced as well as a intra-articular lateral femoral condyle with a slight amount of step-off to the articular surface.  Labs:  Results for orders placed or performed during the hospital encounter of 10/19/23 (from the past 24 hour(s))  Surgical pcr screen     Status: None   Collection Time: 10/20/23 10:13 PM   Specimen: Nasal Mucosa; Nasal Swab  Result Value Ref Range   MRSA, PCR NEGATIVE NEGATIVE   Staphylococcus aureus NEGATIVE NEGATIVE     Imaging or Labs ordered: None  Medical history and chart was reviewed and case discussed with medical provider.  Assessment/Plan: 72 year old female with left patella and intra-articular lateral femoral condyle fracture  Patient require open reduction internal fixation of her left distal femur.  Risks and benefits were discussed with the patient.  Risks include but not limited to bleeding, infection, malunion, nonunion, hardware failure, hardware rotation, nerve or blood vessel injury, DVT, even the possibility anesthetic complications.  We also discussed the possibility of knee stiffness and posttraumatic arthritis.  I feel that the patella is likely nonoperative and management.  All risks were discussed as well as  postoperative recovery and the patient agreed to proceed with surgery and consent was obtained.  Roby Lofts, MD [facility] [phone] (office) [email or web address]

## 2023-10-22 ENCOUNTER — Encounter (HOSPITAL_COMMUNITY): Payer: Self-pay | Admitting: Student

## 2023-10-22 DIAGNOSIS — W19XXXA Unspecified fall, initial encounter: Secondary | ICD-10-CM | POA: Diagnosis not present

## 2023-10-22 DIAGNOSIS — S92034A Nondisplaced avulsion fracture of tuberosity of right calcaneus, initial encounter for closed fracture: Secondary | ICD-10-CM | POA: Diagnosis not present

## 2023-10-22 DIAGNOSIS — S72425A Nondisplaced fracture of lateral condyle of left femur, initial encounter for closed fracture: Secondary | ICD-10-CM | POA: Diagnosis not present

## 2023-10-22 DIAGNOSIS — S82002A Unspecified fracture of left patella, initial encounter for closed fracture: Secondary | ICD-10-CM | POA: Diagnosis not present

## 2023-10-22 LAB — CBC
HCT: 32.7 % — ABNORMAL LOW (ref 36.0–46.0)
Hemoglobin: 10.9 g/dL — ABNORMAL LOW (ref 12.0–15.0)
MCH: 29.6 pg (ref 26.0–34.0)
MCHC: 33.3 g/dL (ref 30.0–36.0)
MCV: 88.9 fL (ref 80.0–100.0)
Platelets: 222 10*3/uL (ref 150–400)
RBC: 3.68 MIL/uL — ABNORMAL LOW (ref 3.87–5.11)
RDW: 13.6 % (ref 11.5–15.5)
WBC: 6.4 10*3/uL (ref 4.0–10.5)
nRBC: 0 % (ref 0.0–0.2)

## 2023-10-22 LAB — VITAMIN D 25 HYDROXY (VIT D DEFICIENCY, FRACTURES): Vit D, 25-Hydroxy: 44.63 ng/mL (ref 30–100)

## 2023-10-22 LAB — BASIC METABOLIC PANEL
Anion gap: 9 (ref 5–15)
BUN: 13 mg/dL (ref 8–23)
CO2: 26 mmol/L (ref 22–32)
Calcium: 9.1 mg/dL (ref 8.9–10.3)
Chloride: 101 mmol/L (ref 98–111)
Creatinine, Ser: 0.91 mg/dL (ref 0.44–1.00)
GFR, Estimated: >60 mL/min (ref >60)
Glucose, Bld: 107 mg/dL — ABNORMAL HIGH (ref 70–99)
Potassium: 4.3 mmol/L (ref 3.5–5.1)
Sodium: 136 mmol/L (ref 135–145)

## 2023-10-22 NOTE — TOC Initial Note (Signed)
Transition of Care [facility]) - Initial/Assessment Note    Patient Details  Name: Heather Rivera MRN: [id number] Date of Birth: 1950/11/28  Transition of Care [facility]) CM/SW Contact:    Lorri Frederick, LCSW Phone Number: 10/22/2023, 2:37 PM  Clinical Narrative:      CSW met with pt and daughter Logan Bores regarding PT recommendation for [facility].  Permission given to speak with both daughters Logan Bores and Shanda Bumps.  Pt lives alone but is planning to DC to Evan's home: [address], [place], [address].  They are agreeable to [facility], medicare choice document provided and they want to review options before picking HH agency.       DME discussed: pt does want wheelchair and 3n1, will plan to purchase walker on her own.  Pt reports she is also wanting to hire [facility] aide support in addition to [facility].  She is aware this would be out of pocket, CSW provided care.[email or web address] resource for locating in home aide agency.           Expected Discharge Plan: Home w Home Health Services Barriers to Discharge: Continued Medical Work up   Patient Goals and CMS Choice Patient states their goals for this hospitalization and ongoing recovery are:: get back to where I was before CMS Medicare.gov Compare Post Acute Care list provided to:: Patient Choice offered to / list presented to : Patient, Adult Children (daughter Logan Bores)      Expected Discharge Plan and Services In-house Referral: Clinical Social Work   Post Acute Care Choice: Home Health Living arrangements for the past 2 months: Single Family Home                                      Prior Living Arrangements/Services Living arrangements for the past 2 months: Single Family Home Lives with:: Self Patient language and need for interpreter reviewed:: Yes Do you feel safe going back to the place where you live?: Yes      Need for Family Participation in Patient Care: Yes (Comment) Care giver support system in place?: Yes (comment) Current home services: Other (comment)  (none) Criminal Activity/Legal Involvement Pertinent to Current Situation/Hospitalization: No - Comment as needed  Activities of Daily Living   ADL Screening (condition at time of admission) Independently performs ADLs?: Yes (appropriate for developmental age) Is the patient deaf or have difficulty hearing?: No Does the patient have difficulty seeing, even when wearing glasses/contacts?: No Does the patient have difficulty concentrating, remembering, or making decisions?: No  Permission Sought/Granted Permission sought to share information with : Family Supports Permission granted to share information with : Yes, Verbal Permission Granted  Share Information with NAME: daughters Logan Bores and Shanda Bumps           Emotional Assessment Appearance:: Appears stated age Attitude/Demeanor/Rapport: Engaged Affect (typically observed): Appropriate, Pleasant Orientation: : Oriented to Self, Oriented to Place, Oriented to  Time, Oriented to Situation      Admission diagnosis:  Femoral distal fracture (HCC) [S72.409A] Closed nondisplaced avulsion fracture of tuberosity of right calcaneus, initial encounter [S92.034A] Fall, initial encounter [W19.XXXA] Closed nondisplaced fracture of lateral condyle of left femur, initial encounter (HCC) [S72.425A] Closed nondisplaced fracture of left patella, unspecified fracture morphology, initial encounter [S82.002A] Fractures involving multiple body regions [T07.XXXA] Patient Active Problem List   Diagnosis Date Noted   Femoral distal fracture (HCC) 10/19/2023   Fractures involving multiple body regions 10/19/2023   History of melanoma  12/19/2022   Persistent cough 12/17/2022   Osteopenia with high frax 12/09/2022   HLD (hyperlipidemia) 06/05/2022   Chronic back pain 07/12/2021   Lipoma 07/12/2021   Benign essential tremor 10/28/2020   Adhesive capsulitis of left shoulder 10/28/2020   Prediabetes 10/27/2020   Neck pain 09/28/2020   Onychomycosis  08/17/2020   Eczema 08/17/2020   Insomnia 06/17/2018   Metatarsalgia of both feet 07/05/2017   Rotator cuff syndrome of right shoulder 05/24/2017   Allergic rhinitis 10/23/2010   PCP:  Pincus Sanes, MD Pharmacy:   [facility] [phone] - [place], [place] - [address] AT [address] OF [facility] [address] [place] [address] Phone: [phone] Fax: [phone]     Social Determinants of Health (SDOH) Social History: SDOH Screenings   Food Insecurity: No Food Insecurity (10/19/2023)  Housing: Low Risk  (10/19/2023)  Transportation Needs: No Transportation Needs (10/19/2023)  Utilities: Not At Risk (10/19/2023)  Depression (PHQ2-9): Low Risk  (06/18/2023)  Financial Resource Strain: Low Risk  (06/05/2018)  Physical Activity: Sufficiently Active (06/05/2018)  Social Connections: Unknown (06/05/2018)  Stress: No Stress Concern Present (06/05/2018)  Tobacco Use: Medium Risk (10/21/2023)   SDOH Interventions:     Readmission Risk Interventions     No data to display

## 2023-10-22 NOTE — Care Management Important Message (Signed)
Important Message  Patient Details  Name: Heather Rivera MRN: [id number] Date of Birth: 06/20/1951   Important Message Given:  Yes - Medicare IM     Dorena Bodo 10/22/2023, 2:44 PM

## 2023-10-22 NOTE — Progress Notes (Signed)
Orthopaedic Trauma Progress Note  SUBJECTIVE: Doing okay this morning.  Is anxious following her surgery.  States that she feels helpless and does not want her family to have to wait on her.  Pain currently well-controlled at rest.  Has not been up out of bed yet since surgery.  No chest pain. No SOB. No nausea/vomiting. No other complaints.  Patient's daughter Sharlynn Oliphant is at bedside.  Patient plans to stay with her other daughter at discharge.  OBJECTIVE:  Vitals:   10/22/23 0459 10/22/23 0934  BP: 127/68 123/65  Pulse: 84 88  Resp: 17 18  Temp: 98.3 F (36.8 C) 98.6 F (37 C)  SpO2: 93% 93%    General: Sitting up in bed, no acute distress Respiratory: No increased work of breathing.  LLE: Dressing clean, dry, intact.  Tenderness over the knee and throughout the thigh as expected.  No calf tenderness.  Tolerates gentle active and passive knee motion.  Ankle dorsiflexion/plantarflexion intact.  Endorses sensation light touch of the toes.  Foot warm and well-perfused.  IMAGING: Stable post op imaging.   LABS:  Results for orders placed or performed during the hospital encounter of 10/19/23 (from the past 24 hour(s))  CBC     Status: Abnormal   Collection Time: 10/21/23 10:40 AM  Result Value Ref Range   WBC 8.4 4.0 - 10.5 K/uL   RBC 3.89 3.87 - 5.11 MIL/uL   Hemoglobin 11.7 (L) 12.0 - 15.0 g/dL   HCT 78.4 (L) 69.6 - 29.5 %   MCV 88.9 80.0 - 100.0 fL   MCH 30.1 26.0 - 34.0 pg   MCHC 33.8 30.0 - 36.0 g/dL   RDW 28.4 13.2 - 44.0 %   Platelets 238 150 - 400 K/uL   nRBC 0.0 0.0 - 0.2 %  Creatinine, serum     Status: None   Collection Time: 10/21/23 10:40 AM  Result Value Ref Range   Creatinine, Ser 0.84 0.44 - 1.00 mg/dL   GFR, Estimated >10 >27 mL/min  CBC     Status: Abnormal   Collection Time: 10/22/23  5:41 AM  Result Value Ref Range   WBC 6.4 4.0 - 10.5 K/uL   RBC 3.68 (L) 3.87 - 5.11 MIL/uL   Hemoglobin 10.9 (L) 12.0 - 15.0 g/dL   HCT 25.3 (L) 66.4 - 40.3 %   MCV 88.9  80.0 - 100.0 fL   MCH 29.6 26.0 - 34.0 pg   MCHC 33.3 30.0 - 36.0 g/dL   RDW 47.4 25.9 - 56.3 %   Platelets 222 150 - 400 K/uL   nRBC 0.0 0.0 - 0.2 %  Basic metabolic panel     Status: Abnormal   Collection Time: 10/22/23  5:41 AM  Result Value Ref Range   Sodium 136 135 - 145 mmol/L   Potassium 4.3 3.5 - 5.1 mmol/L   Chloride 101 98 - 111 mmol/L   CO2 26 22 - 32 mmol/L   Glucose, Bld 107 (H) 70 - 99 mg/dL   BUN 13 8 - 23 mg/dL   Creatinine, Ser 8.75 0.44 - 1.00 mg/dL   Calcium 9.1 8.9 - 64.3 mg/dL   GFR, Estimated >32 >95 mL/min   Anion gap 9 5 - 15  VITAMIN D 25 Hydroxy (Vit-D Deficiency, Fractures)     Status: None   Collection Time: 10/22/23  5:41 AM  Result Value Ref Range   Vit D, 25-Hydroxy 44.63 30 - 100 ng/mL    ASSESSMENT: Marlon Over is a  72 y.o. female, 1 Day Post-Op  S/p OPEN REDUCTION INTERNAL FIXATION LEFT DISTAL FEMUR FRACTURE  CV/Blood loss: Acute blood loss anemia, Hgb 10.9 this morning. Hemodynamically stable  PLAN: Weightbearing: NWB LLE ROM: Okay for unrestricted knee motion as tolerated Incisional and dressing care: Reinforce dressings as needed  Showering: Patient okay to shower today, keep incision covered Orthopedic device(s): None  Pain management:  1. Tylenol 1000 mg q 6 hours scheduled 2. Robaxin 500 mg q 6 hours PRN 3. Oxycodone 5 mg q 4 hours PRN 4. Dilaudid 0.5-1 mg q 3 hours PRN 5. Toradol 7.5 mg q 6 hours x 5 doses VTE prophylaxis: Lovenox, SCDs ID:  Ancef 2gm post op Foley/Lines:  No foley, KVO IVFs Impediments to Fracture Healing: Vitamin D level 44, no additional supplementation needed Dispo: PT/OT evaluation today.  Patient will likely require home health at discharge.  Plan is to discharge to her daughter's house.  Daughter will need to obtain a bed prior to discharge.  Plan to change dressing LLE tomorrow 10/23/2023   D/C recommendations: -Oxycodone, Robaxin, Tylenol for pain control -Aspirin 325 mg daily x 30 days for DVT  prophylaxis -No additional need for Vit D supplementation  Follow - up plan: 2 weeks after discharge for wound check and repeat x-rays   Contact information:  Truitt Merle MD, Thyra Breed PA-C. After hours and holidays please check [email or web address] for group call information for [facility]   Thompson Caul, PA-C [phone] (office) [email or web address]

## 2023-10-22 NOTE — Evaluation (Signed)
Occupational Therapy Evaluation Patient Details Name: Heather Rivera MRN: [id number] DOB: 1951/08/17 Today's Date: 10/22/2023   History of Present Illness Pt is a 72 y.o. F who presents 10/19/2023 after a fall with left intra-articular distal femur fracture now s/p ORIF 12/9 and left patella fracture (non operative management). R ankle avulsion fx from distal aspect of R calcaneus. PMH includes: essential tremor.   Clinical Impression   PTA patient independent and working. Admitted for above and presents with problem list below.  Currently requires mod assist +2 for bed mobility, min assist +2 for transfers into recliner using RW, and setup to total assist for ADLS (mostly assist for LB). She demonstrates good adherence to NWB to L LE. Anticipate she will progress well as pain improves.  Based on performance today, will benefit from continued OT services acutely and after dc at [facility] level to optimize independence, safety and return to PLOF.       If plan is discharge home, recommend the following: A lot of help with bathing/dressing/bathroom;A lot of help with walking and/or transfers;Assistance with cooking/housework;Assist for transportation;Help with stairs or ramp for entrance    Functional Status Assessment  Patient has had a recent decline in their functional status and demonstrates the ability to make significant improvements in function in a reasonable and predictable amount of time.  Equipment Recommendations  BSC/3in1;Other (comment) (RW)    Recommendations for Other Services       Precautions / Restrictions Precautions Precautions: Fall Required Braces or Orthoses: Other Brace Other Brace: R soft ankle brace Restrictions Weight Bearing Restrictions: Yes LLE Weight Bearing: Non weight bearing      Mobility Bed Mobility Overal bed mobility: Needs Assistance Bed Mobility: Supine to Sit     Supine to sit: Mod assist, +2 for physical assistance     General bed mobility  comments: L LE and hip management towards EOB, cueing for technique with use of rail    Transfers Overall transfer level: Needs assistance Equipment used: Rolling walker (2 wheels) Transfers: Sit to/from Stand, Bed to chair/wheelchair/BSC Sit to Stand: Min assist, +2 physical assistance Stand pivot transfers: Min assist, +2 physical assistance         General transfer comment: cueing for hand placement and technique to maintain NWB to L LE, pivoting on R LE with to recliner      Balance Overall balance assessment: Needs assistance Sitting-balance support: No upper extremity supported, Feet supported Sitting balance-Leahy Scale: Good     Standing balance support: Bilateral upper extremity supported, During functional activity Standing balance-Leahy Scale: Poor Standing balance comment: relies on RW and external support                           ADL either performed or assessed with clinical judgement   ADL Overall ADL's : Needs assistance/impaired     Grooming: Set up;Sitting           Upper Body Dressing : Set up;Sitting   Lower Body Dressing: Sit to/from stand;Total assistance;+2 for physical assistance   Toilet Transfer: Minimal assistance;+2 for physical assistance;Stand-pivot;Rolling walker (2 wheels) Toilet Transfer Details (indicate cue type and reason): to recliner         Functional mobility during ADLs: Minimal assistance;+2 for physical assistance;Moderate assistance;Rolling walker (2 wheels)       Vision Patient Visual Report: No change from baseline Vision Assessment?: No apparent visual deficits     Perception  Praxis         Pertinent Vitals/Pain Pain Assessment Pain Assessment: Faces Faces Pain Scale: Hurts even more Pain Location: L LE Pain Descriptors / Indicators: Discomfort, Grimacing, Operative site guarding Pain Intervention(s): Limited activity within patient's tolerance, Monitored during session,  Premedicated before session, Repositioned     Extremity/Trunk Assessment Upper Extremity Assessment Upper Extremity Assessment: Overall WFL for tasks assessed   Lower Extremity Assessment Lower Extremity Assessment: Defer to PT evaluation (s/p L surgery)       Communication Communication Communication: No apparent difficulties   Cognition Arousal: Alert Behavior During Therapy: WFL for tasks assessed/performed Overall Cognitive Status: Within Functional Limits for tasks assessed                                       General Comments  daughter at side and supportive    Exercises     Shoulder Instructions      Home Living Family/patient expects to be discharged to:: Private residence Living Arrangements: Children (daughter) Available Help at Discharge: Family Type of Home: House Home Access: Stairs to enter Secretary/administrator of Steps: 1 (grass to get to patio with 1 STE)   Home Layout: Able to live on main level with bedroom/bathroom     Bathroom Shower/Tub: Producer, television/film/video: Standard     Home Equipment: None   Additional Comments: Discharging to daughter's house. Plans to hire caregiver      Prior Functioning/Environment Prior Level of Function : Independent/Modified Independent;Working/employed;Driving                        OT Problem List: Decreased strength;Decreased activity tolerance;Impaired balance (sitting and/or standing);Decreased knowledge of use of DME or AE;Decreased knowledge of precautions;Pain;Obesity      OT Treatment/Interventions: Self-care/ADL training;Therapeutic exercise;DME and/or AE instruction;Therapeutic activities;Patient/family education;Balance training;Cognitive remediation/compensation    OT Goals(Current goals can be found in the care plan section) Acute Rehab OT Goals Patient Stated Goal: less pain OT Goal Formulation: With patient Time For Goal Achievement: 11/05/23 Potential  to Achieve Goals: Good  OT Frequency: Min 1X/week    Co-evaluation PT/OT/SLP Co-Evaluation/Treatment: Yes     OT goals addressed during session: ADL's and self-care      AM-PAC OT "6 Clicks" Daily Activity     Outcome Measure Help from another person eating meals?: None Help from another person taking care of personal grooming?: A Little Help from another person toileting, which includes using toliet, bedpan, or urinal?: A Lot Help from another person bathing (including washing, rinsing, drying)?: A Lot Help from another person to put on and taking off regular upper body clothing?: A Little Help from another person to put on and taking off regular lower body clothing?: A Lot 6 Click Score: 16   End of Session Equipment Utilized During Treatment: Gait belt;Rolling walker (2 wheels) Nurse Communication: Mobility status;Patient requests pain meds  Activity Tolerance: Patient tolerated treatment well Patient left: in chair;with call bell/phone within reach;with chair alarm set  OT Visit Diagnosis: Other abnormalities of gait and mobility (R26.89);Muscle weakness (generalized) (M62.81);Pain Pain - Right/Left: Left Pain - part of body: Leg;Hip                Time: 8657-8469 OT Time Calculation (min): 39 min Charges:  OT General Charges $OT Visit: 1 Visit OT Evaluation $OT Eval Moderate Complexity: 1 Mod OT Treatments $  Self Care/Home Management : 8-22 mins  Barry Brunner, OT Acute Rehabilitation Services Office [phone]   Chancy Milroy 10/22/2023, 1:08 PM

## 2023-10-22 NOTE — Progress Notes (Signed)
[facility]                                                                              Heather Rivera, is a 72 y.o. female, DOB - 09-Dec-1950, QVZ:[id number] Admit date - 10/19/2023    Outpatient Primary MD for the patient is Lawerance Bach, Bobette Mo, MD  LOS - 3  days  Chief Complaint  Patient presents with   Fall       Brief summary   Patient is a 72 year old female with hyperlipidemia, benign essential tremor, prediabetes, osteopenia presented initially to [facility], ED after mechanical fall, with no syncopal episode.  She reported tripping over a curb when trying to get into her car and immediately noted pain and inability to bear weight on the left lower extremity, pain in the right heel and pain in the right wrist while she tried to catch herself as she fell.  She reported being able to lift herself into her car and drove home and noted that she was in more pain than anticipated.  EMS was called. Plain films of the (L) knee and (R) ankle obtained and shows oblique fracture through the (L) lateral femoral condyle and an avulsion fracture from distal aspect of the (R) calcaneus. CT (L) knee shows vertical fracture through the distal femoral metaphysis of the femur extending into the femoral condyle adjacent to the intercondylar notch with displacement and a comminuted fracture of the lateral aspect of the patella.   Orthopedics was consulted.  Patient was transferred to [facility] for surgery.  Assessment & Plan    Principal Problem: Left lateral femoral condyle fracture -Avulsion fracture from distal aspect of the calcaneus -orthopedics consulted -Underwent open reduction internal fixation of the left lateral femoral condyle on 10/21/2023, postop day #1 -Pain controlled, PT OT evaluation -Per orthopedics, aspirin 325 mg daily for 30 days for DVT prophylaxis, outpatient follow-up in 2 weeks after discharge for wound check and repeat x-rays, with Dr. Jena Gauss  Active  Problems: Left lateral comminuted, nondisplaced patella fracture -Orthopedics following, recommended nonoperative management of left patella fracture -PT OT evaluation tomorrow -Knee immobilizer in place    Benign essential tremor -Continue propranolol    HLD (hyperlipidemia) -Continue Crestor   Estimated body mass index is 27.86 kg/m as calculated from the following:   Height as of this encounter: 5' 5.5" (1.664 m).   Weight as of this encounter: 77.1 kg.  Code Status: Full CODE STATUS DVT Prophylaxis:  enoxaparin (LOVENOX) injection 40 mg Start: 10/22/23 0800 SCDs Start: 10/21/23 0956 SCDs Start: 10/19/23 0758   Level of Care: Level of care: Med-Surg Family Communication: Updated patient's daughter at the bedside. Disposition Plan:      Remains inpatient appropriate:   Patient wanting to discharge to her daughter's house with home health PT, OT, aide.  Daughter also works during the day.   Procedures:  10/21/2023 open reduction internal fixation of left lateral femoral condyle  Consultants:   Orthopedics  Antimicrobials:   Anti-infectives (From admission, onward)    Start     Dose/Rate Route Frequency Ordered Stop   10/21/23 1400  ceFAZolin (ANCEF) IVPB 2g/100 mL  premix        2 g 200 mL/hr over 30 Minutes Intravenous Every 8 hours 10/21/23 0955 10/22/23 0614   10/21/23 0841  vancomycin (VANCOCIN) powder  Status:  Discontinued          As needed 10/21/23 0841 10/21/23 0905          Medications  acetaminophen  1,000 mg Oral QID   acidophilus  1 capsule Oral Daily   cholecalciferol  400 Units Oral Daily   docusate sodium  100 mg Oral BID   enoxaparin (LOVENOX) injection  40 mg Subcutaneous Q24H   fluticasone  2 spray Each Nare Daily   loratadine  10 mg Oral Daily   magnesium oxide  200 mg Oral QPM   montelukast  10 mg Oral QHS   propranolol  40 mg Oral Q1500   rosuvastatin  10 mg Oral Daily   sodium chloride flush  10 mL Intravenous Q12H       Subjective:   Heather Rivera was seen and examined today.  No acute complaints today, pain is controlled, PT evaluation today.  Daughter at the bedside.  No chest pain, shortness of breath, fevers or chills, abdominal pain.  Objective:   Vitals:   10/21/23 1534 10/21/23 2050 10/22/23 0459 10/22/23 0934  BP: 119/68 121/67 127/68 123/65  Pulse: 74 75 84 88  Resp: 18 17 17 18   Temp: 98.4 F (36.9 C) 98.3 F (36.8 C) 98.3 F (36.8 C) 98.6 F (37 C)  TempSrc: Oral Oral Oral Oral  SpO2: 95% 93% 93% 93%  Weight:      Height:        Intake/Output Summary (Last 24 hours) at 10/22/2023 1406 Last data filed at 10/22/2023 1219 Gross per 24 hour  Intake 200 ml  Output 1100 ml  Net -900 ml     Wt Readings from Last 3 Encounters:  10/21/23 77.1 kg  06/18/23 77.1 kg  05/06/23 74.8 kg   Physical Exam General: Alert and oriented x 3, NAD Cardiovascular: S1 S2 clear, RRR.  Respiratory: CTAB, Gastrointestinal: Soft, nontender, nondistended, NBS Ext: no pedal edema bilaterally Neuro: no new deficits    Data Reviewed:  I have personally reviewed following labs    CBC Lab Results  Component Value Date   WBC 6.4 10/22/2023   RBC 3.68 (L) 10/22/2023   HGB 10.9 (L) 10/22/2023   HCT 32.7 (L) 10/22/2023   MCV 88.9 10/22/2023   MCH 29.6 10/22/2023   PLT 222 10/22/2023   MCHC 33.3 10/22/2023   RDW 13.6 10/22/2023   LYMPHSABS 1.4 10/19/2023   MONOABS 0.6 10/19/2023   EOSABS 0.0 10/19/2023   BASOSABS 0.0 10/19/2023     Last metabolic panel Lab Results  Component Value Date   NA 136 10/22/2023   K 4.3 10/22/2023   CL 101 10/22/2023   CO2 26 10/22/2023   BUN 13 10/22/2023   CREATININE 0.91 10/22/2023   GLUCOSE 107 (H) 10/22/2023   GFRNONAA >60 10/22/2023   CALCIUM 9.1 10/22/2023   PROT 7.2 06/19/2023   ALBUMIN 4.3 06/19/2023   BILITOT 0.7 06/19/2023   ALKPHOS 41 06/19/2023   AST 17 06/19/2023   ALT 19 06/19/2023   ANIONGAP 9 10/22/2023    CBG (last 3)   No results for input(s): "GLUCAP" in the last 72 hours.    Coagulation Profile: No results for input(s): "INR", "PROTIME" in the last 168 hours.   Radiology Studies: I have personally reviewed the imaging studies  DG  Knee Left Port  Result Date: 10/21/2023 CLINICAL DATA:  Postop fracture ORIF EXAM: PORTABLE LEFT KNEE - 2 VIEW COMPARISON:  Preop imaging 10/19/2023 and 10/18/2023 FINDINGS: Long lateral fixation plate with multiple transverse screws along the distal femur transfixing the distal femoral metaphyseal fracture. Expected alignment. Soft tissue gas seen including some gas in the joint space. Anterior soft tissue thickening as well as lateral. Possible additional separate patellar fracture. Imaging was obtained to aid in treatment. IMPRESSION: Acute surgical changes of ORIF of the distal femur. Separate patellar fracture as well Electronically Signed   By: Karen Kays M.D.   On: 10/21/2023 12:59   DG FEMUR MIN 2 VIEWS LEFT  Result Date: 10/21/2023 CLINICAL DATA:  ORIF femur EXAM: Intraoperative fluoroscopy COMPARISON:  CT 10/19/2023 and x-ray FINDINGS: Four fluoroscopic spot images submitted for review demonstrate placement of the fixation plate with screws transfixing the distal femoral fracture. Expected alignment. Imaging was obtained to aid in treatment. Please correlate with real-time fluoroscopy of 48 seconds and cumulative dose 3.65 mGy IMPRESSION: Intraoperative fluoroscopy. Electronically Signed   By: Karen Kays M.D.   On: 10/21/2023 12:57   DG C-Arm 1-60 Min-No Report  Result Date: 10/21/2023 Fluoroscopy was utilized by the requesting physician.  No radiographic interpretation.       Thad Ranger M.D. [facility] 10/22/2023, 2:06 PM  Available via Epic secure chat 7am-7pm After 7 pm, please refer to night coverage provider listed on amion.

## 2023-10-22 NOTE — Evaluation (Signed)
Physical Therapy Evaluation Patient Details Name: Heather Rivera MRN: [id number] DOB: 08-Apr-1951 Today's Date: 10/22/2023  History of Present Illness  Pt is a 72 y.o. F who presents 10/19/2023 after a fall with left intra-articular distal femur fracture now s/p ORIF 12/9 and left patella fracture (non operative management). R ankle avulsion fx from distal aspect of R calcaneus. PMH includes: essential tremor.  Clinical Impression  PTA, pt independent and working. Plans to discharge to her daughter's home and states she will hire caregiver assist as needed. Pt presents with decreased functional mobility secondary to weightbearing precautions, pain, decreased L knee ROM and weakness. Pt requiring two person minimal assist for stand pivot transfers using RW from bed to chair. Demonstrates good adherence to maintaining LLE in NWB position. Suspect good progress. Currently recommending HHPT.        If plan is discharge home, recommend the following: A little help with walking and/or transfers;A little help with bathing/dressing/bathroom;Assistance with cooking/housework;Help with stairs or ramp for entrance;Assist for transportation   Can travel by private vehicle        Equipment Recommendations Rolling walker (2 wheels);BSC/3in1;Wheelchair (measurements PT);Wheelchair cushion (measurements PT)  Recommendations for Other Services       Functional Status Assessment Patient has had a recent decline in their functional status and demonstrates the ability to make significant improvements in function in a reasonable and predictable amount of time.     Precautions / Restrictions Precautions Precautions: Fall Required Braces or Orthoses: Other Brace Other Brace: R soft ankle brace Restrictions Weight Bearing Restrictions: Yes LLE Weight Bearing: Non weight bearing      Mobility  Bed Mobility Overal bed mobility: Needs Assistance Bed Mobility: Supine to Sit     Supine to sit: Mod assist,  +2 for physical assistance     General bed mobility comments: L LE and hip management towards EOB, cueing for technique with use of rail    Transfers Overall transfer level: Needs assistance Equipment used: Rolling walker (2 wheels) Transfers: Sit to/from Stand, Bed to chair/wheelchair/BSC Sit to Stand: Min assist, +2 physical assistance Stand pivot transfers: Min assist, +2 physical assistance         General transfer comment: cueing for hand placement and technique to maintain NWB to L LE, pivoting on R LE with to recliner    Ambulation/Gait                  Stairs            Wheelchair Mobility     Tilt Bed    Modified Rankin (Stroke Patients Only)       Balance Overall balance assessment: Needs assistance Sitting-balance support: No upper extremity supported, Feet supported Sitting balance-Leahy Scale: Good     Standing balance support: Bilateral upper extremity supported, During functional activity Standing balance-Leahy Scale: Poor Standing balance comment: relies on RW and external support                             Pertinent Vitals/Pain Pain Assessment Pain Assessment: Faces Faces Pain Scale: Hurts even more Pain Location: L LE Pain Descriptors / Indicators: Discomfort, Grimacing, Operative site guarding Pain Intervention(s): Limited activity within patient's tolerance, Monitored during session, Premedicated before session, Repositioned    Home Living Family/patient expects to be discharged to:: Private residence Living Arrangements: Children (daughter) Available Help at Discharge: Family Type of Home: House Home Access: Stairs to enter   [employer] of  Steps: 1 (grass to get to patio with 1 STE)   Home Layout: Able to live on main level with bedroom/bathroom Home Equipment: None Additional Comments: Discharging to daughter's house. Plans to hire caregiver    Prior Function Prior Level of Function :  Independent/Modified Independent;Working/employed;Driving                     Extremity/Trunk Assessment   Upper Extremity Assessment Upper Extremity Assessment: Overall WFL for tasks assessed    Lower Extremity Assessment Lower Extremity Assessment: RLE deficits/detail;LLE deficits/detail RLE Deficits / Details: Reports mild sprain from fall with ecchymosis medial ankle. ROM WFL LLE Deficits / Details: 20-30 degrees knee flexion ROM       Communication   Communication Communication: No apparent difficulties  Cognition Arousal: Alert Behavior During Therapy: WFL for tasks assessed/performed Overall Cognitive Status: Within Functional Limits for tasks assessed                                          General Comments General comments (skin integrity, edema, etc.): daughter at side and supportive    Exercises     Assessment/Plan    PT Assessment Patient needs continued PT services  PT Problem List Decreased strength;Decreased range of motion;Decreased activity tolerance;Decreased balance;Decreased mobility;Pain       PT Treatment Interventions DME instruction;Stair training;Gait training;Functional mobility training;Therapeutic activities;Balance training;Therapeutic exercise;Patient/family education    PT Goals (Current goals can be found in the Care Plan section)  Acute Rehab PT Goals Patient Stated Goal: to go home PT Goal Formulation: With patient/family Time For Goal Achievement: 11/05/23 Potential to Achieve Goals: Good    Frequency Min 1X/week     Co-evaluation PT/OT/SLP Co-Evaluation/Treatment: Yes Reason for Co-Treatment: To address functional/ADL transfers;For patient/therapist safety PT goals addressed during session: Mobility/safety with mobility OT goals addressed during session: ADL's and self-care       AM-PAC PT "6 Clicks" Mobility  Outcome Measure Help needed turning from your back to your side while in a flat bed  without using bedrails?: A Little Help needed moving from lying on your back to sitting on the side of a flat bed without using bedrails?: A Lot Help needed moving to and from a bed to a chair (including a wheelchair)?: A Little Help needed standing up from a chair using your arms (e.g., wheelchair or bedside chair)?: A Little Help needed to walk in hospital room?: Total Help needed climbing 3-5 steps with a railing? : Total 6 Click Score: 13    End of Session Equipment Utilized During Treatment: Gait belt;Other (comment) (R soft ankle brace) Activity Tolerance: Patient tolerated treatment well Patient left: in chair;with call bell/phone within reach;with chair alarm set Nurse Communication: Mobility status PT Visit Diagnosis: Pain;Difficulty in walking, not elsewhere classified (R26.2) Pain - Right/Left: Left Pain - part of body: Knee    Time: 7829-5621 PT Time Calculation (min) (ACUTE ONLY): 41 min   Charges:   PT Evaluation $PT Eval Low Complexity: 1 Low   PT General Charges $$ ACUTE PT VISIT: 1 Visit         Lillia Pauls, PT, DPT Acute Rehabilitation Services Office [phone]   Norval Morton 10/22/2023, 1:39 PM

## 2023-10-22 NOTE — TOC CAGE-AID Note (Signed)
Transition of Care [facility]) - CAGE-AID Screening  Patient Details  Name: Heather Rivera MRN: [id number] Date of Birth: March 05, 1951  Clinical Narrative:  Patient denies any alcohol or drug use, no need to provide substance abuse resources at this time.  CAGE-AID Screening:    Have You Ever Felt You Ought to Cut Down on Your Drinking or Drug Use?: No Have People Annoyed You By Critizing Your Drinking Or Drug Use?: No Have You Felt Bad Or Guilty About Your Drinking Or Drug Use?: No Have You Ever Had a Drink or Used Drugs First Thing In The Morning to Steady Your Nerves or to Get Rid of a Hangover?: No CAGE-AID Score: 0  Substance Abuse Education Offered: No

## 2023-10-23 DIAGNOSIS — S72492A Other fracture of lower end of left femur, initial encounter for closed fracture: Secondary | ICD-10-CM | POA: Diagnosis not present

## 2023-10-23 LAB — BASIC METABOLIC PANEL
Anion gap: 6 (ref 5–15)
BUN: 10 mg/dL (ref 8–23)
CO2: 24 mmol/L (ref 22–32)
Calcium: 8.7 mg/dL — ABNORMAL LOW (ref 8.9–10.3)
Chloride: 102 mmol/L (ref 98–111)
Creatinine, Ser: 0.71 mg/dL (ref 0.44–1.00)
GFR, Estimated: >60 mL/min (ref >60)
Glucose, Bld: 115 mg/dL — ABNORMAL HIGH (ref 70–99)
Potassium: 3.9 mmol/L (ref 3.5–5.1)
Sodium: 132 mmol/L — ABNORMAL LOW (ref 135–145)

## 2023-10-23 LAB — CBC
HCT: 31.8 % — ABNORMAL LOW (ref 36.0–46.0)
Hemoglobin: 10.8 g/dL — ABNORMAL LOW (ref 12.0–15.0)
MCH: 29.9 pg (ref 26.0–34.0)
MCHC: 34 g/dL (ref 30.0–36.0)
MCV: 88.1 fL (ref 80.0–100.0)
Platelets: 250 10*3/uL (ref 150–400)
RBC: 3.61 MIL/uL — ABNORMAL LOW (ref 3.87–5.11)
RDW: 13.6 % (ref 11.5–15.5)
WBC: 5.8 10*3/uL (ref 4.0–10.5)
nRBC: 0 % (ref 0.0–0.2)

## 2023-10-23 MED ORDER — METHOCARBAMOL 500 MG PO TABS: 500.0000 mg | ORAL_TABLET | Freq: Four times a day (QID) | ORAL | 0 refills | Status: DC | PRN

## 2023-10-23 MED ORDER — OXYCODONE HCL 5 MG PO TABS: 5.0000 mg | ORAL_TABLET | ORAL | 0 refills | Status: DC | PRN

## 2023-10-23 MED ORDER — ASPIRIN 325 MG PO TBEC: 325.0000 mg | DELAYED_RELEASE_TABLET | Freq: Every day | ORAL | 0 refills | Status: AC

## 2023-10-23 MED ORDER — POLYETHYLENE GLYCOL 3350 17 G PO PACK: 17.0000 g | PACK | Freq: Every day | ORAL | Status: DC | PRN

## 2023-10-23 MED ORDER — ACETAMINOPHEN 500 MG PO TABS: 500.0000 mg | ORAL_TABLET | Freq: Four times a day (QID) | ORAL | Status: DC | PRN

## 2023-10-23 NOTE — Plan of Care (Signed)

## 2023-10-23 NOTE — Progress Notes (Cosign Needed)
    Durable Medical Equipment  (From admission, onward)           Start     Ordered   10/23/23 0842  For home use only DME lightweight manual wheelchair with seat cushion  Once       Comments: Patient suffers from Left femoral fracture which impairs their ability to perform daily activities like bathing and grooming in the home.  A walker will not resolve  issue with performing activities of daily living. A wheelchair will allow patient to safely perform daily activities. Patient is not able to propel themselves in the home using a standard weight wheelchair due to general weakness. Patient can self propel in the lightweight wheelchair. Length of need 12 months . Accessories: elevating leg rests (ELRs), wheel locks, extensions and anti-tippers.   10/23/23 0842   10/23/23 0841  For home use only DME Bedside commode  Once       Comments: Confine to one room  Question:  Patient needs a bedside commode to treat with the following condition  Answer:  Gait instability   10/23/23 0842

## 2023-10-23 NOTE — Progress Notes (Signed)
Orthopaedic Trauma Progress Note  SUBJECTIVE: Doing well this morning.  Overall feeling less anxious about her prognosis.  Therapies went well yesterday.  No BM since Friday, MiraLAX has been scheduled starting today.  Denies chest pain, shortness of breath, nausea/vomiting.  Denies any numbness or tingling throughout her extremity.  OBJECTIVE:  Vitals:   10/23/23 0442 10/23/23 0746  BP: 122/70 126/72  Pulse: 89 94  Resp:  18  Temp: 99 F (37.2 C) 99 F (37.2 C)  SpO2: 90% 92%    General: Sitting up in bed, no acute distress Respiratory: No increased work of breathing.  LLE: Dressing removed, incisions are clean, dry, intact.  New Mepilex dressings applied.  Tenderness over the knee and throughout the thigh as expected.  No calf tenderness.  Tolerates gentle active and passive knee motion.  Ankle dorsiflexion/plantarflexion intact.  Endorses sensation light touch of the toes.  Foot warm and well-perfused. Right lower extremity: Notable bruising over the dorsum of the foot and lateral ankle.  Swelling present but appropriate.  No significant tenderness with palpation about the ankle or foot.  Able dorsiflexion/plantarflexion is intact.  Able to wiggle the toes.  Dorsal sensation all aspects of the foot.  Neurovascularly intact  IMAGING: Stable post op imaging left knee/femur.   LABS:  Results for orders placed or performed during the hospital encounter of 10/19/23 (from the past 24 hour(s))  CBC     Status: Abnormal   Collection Time: 10/23/23  5:25 AM  Result Value Ref Range   WBC 5.8 4.0 - 10.5 K/uL   RBC 3.61 (L) 3.87 - 5.11 MIL/uL   Hemoglobin 10.8 (L) 12.0 - 15.0 g/dL   HCT 16.1 (L) 09.6 - 04.5 %   MCV 88.1 80.0 - 100.0 fL   MCH 29.9 26.0 - 34.0 pg   MCHC 34.0 30.0 - 36.0 g/dL   RDW 40.9 81.1 - 91.4 %   Platelets 250 150 - 400 K/uL   nRBC 0.0 0.0 - 0.2 %  Basic metabolic panel     Status: Abnormal   Collection Time: 10/23/23  5:25 AM  Result Value Ref Range   Sodium 132  (L) 135 - 145 mmol/L   Potassium 3.9 3.5 - 5.1 mmol/L   Chloride 102 98 - 111 mmol/L   CO2 24 22 - 32 mmol/L   Glucose, Bld 115 (H) 70 - 99 mg/dL   BUN 10 8 - 23 mg/dL   Creatinine, Ser 7.82 0.44 - 1.00 mg/dL   Calcium 8.7 (L) 8.9 - 10.3 mg/dL   GFR, Estimated >95 >62 mL/min   Anion gap 6 5 - 15    ASSESSMENT: Heather Rivera is a 72 y.o. female, 2 Days Post-Op  S/p OPEN REDUCTION INTERNAL FIXATION LEFT DISTAL FEMUR FRACTURE  CV/Blood loss: Hemoglobin 10.8 this morning.  Stable.  Hemodynamically stable  PLAN: Weightbearing: TDWB LLE ROM: Okay for unrestricted knee motion as tolerated Incisional and dressing care: Change dressings as needed.  Okay for incisions to be left open to air Showering: Patient okay to shower, keep incision covered.  Incisions may begin getting wet starting 10/24/2023 Orthopedic device(s): None  Pain management:  1. Tylenol 1000 mg q 6 hours scheduled 2. Robaxin 500 mg q 6 hours PRN 3. Oxycodone 5 mg q 4 hours PRN 4. Dilaudid 0.5-1 mg q 3 hours PRN VTE prophylaxis: Lovenox, SCDs ID:  Ancef 2gm post op completed Foley/Lines:  No foley, KVO IVFs Impediments to Fracture Healing: Vitamin D level 44, no additional  supplementation needed Dispo: PT/OT evaluation ongoing, currently recommending home health.  Patient stable for discharge from ortho standpoint once cleared by medicine team and therapies.    D/C recommendations: -Oxycodone, Robaxin, Tylenol for pain control -Aspirin 325 mg daily x 30 days for DVT prophylaxis -Continue home dose Vit D supplementation  Follow - up plan: 2 weeks after discharge for wound check and repeat x-rays   Contact information:  Truitt Merle MD, Thyra Breed PA-C. After hours and holidays please check [email or web address] for group call information for [facility]   Thompson Caul, PA-C [phone] (office) [email or web address]

## 2023-10-23 NOTE — Progress Notes (Signed)
PROGRESS NOTE  Heather Rivera  DOB: Apr 23, 1951  PCP: Pincus Sanes, MD ONG:[id number]  DOA: 10/19/2023  LOS: 4 days  Hospital Day: 5  Brief narrative: Heather Rivera is a 72 y.o. female with PMH significant for hyperlipidemia, benign tremors, prediabetes, osteopenia 12/7, patient initially presented at [facility], ED after mechanical fall, no syncopal episode.  She reported tripping over a curb when trying to get into her car and immediately noted pain and inability to bear weight on the left lower extremity, pain in the right heel and pain in the right wrist while she tried to catch herself as she fell.  She reported being able to lift herself into her car and drove home and noted that she was in more pain than anticipated.  EMS was called.  X-ray left knee showed oblique fracture through the left lateral femoral condyle CT left knee showed vertical fracture through the distal femur metaphysis of the femur extending into the femoral condyle adjacent to the internal condylar notch with displacement and a commuted fracture of the lateral aspect patella. X-ray right ankle showed an avulsion fracture from the distal aspect of the right calcaneus  Orthopedics was consulted Patient was transferred to [facility] for surgery.  Admitted to [facility]  Subjective: Patient was seen and examined this afternoon. Pleasant elderly Caucasian female.  Sitting up in recliner.  Not in distress.  No agitation.  Worked with PT today and feels tired. No family at bedside. Chart reviewed. Remains hemodynamically stable Labs from this morning with sodium low at 132  Assessment and plan: Left lateral femoral condyle fracture Commuted nondisplaced fracture of left patella S/p ORIF left distal femur fracture 12/9 Dr. Jena Gauss Nonoperative management of left patella fracture.  Knee immobilizer in place. Currently on pain control with scheduled Tylenol, as needed oxycodone, Robaxin and as needed Dilaudid PT eval Per  orthopedics, touchdown weightbearing with left lower extremity.  Okay for unrestricted knee motion as tolerated. Lovenox subcu for DVT prophylaxis.  Post discharge DVT prophylaxis plan with aspirin 325 mg for 30 days. To follow-up with orthopedics in 2 weeks for repeat x-rays and wound check.  Avulsion fracture from distal aspect of the right calcaneus Per patient, she has discussed this with orthopedic surgeon and the plan is nonsurgical management.  She is allowed to bear weight on it.  Benign essential tremor Continue propranolol   HLD Continue Crestor   Mobility: PT eval obtained.  Recommended home with PT  Goals of care   Code Status: Full Code     DVT prophylaxis:  enoxaparin (LOVENOX) injection 40 mg Start: 10/22/23 0800 SCDs Start: 10/21/23 0956 SCDs Start: 10/19/23 0758   Antimicrobials: None Fluid: None Consultants: Orthopedics Family Communication: None at bedside  Status: Inpatient Level of care:  Med-Surg   Patient is from: Home Needs to continue in-hospital care: Recovering from multiple fractures Anticipated d/c to: Hopefully home tomorrow   Diet:  Diet Order             Diet Heart Room service appropriate? Yes; Fluid consistency: Thin  Diet effective now                   Scheduled Meds:  acetaminophen  1,000 mg Oral QID   acidophilus  1 capsule Oral Daily   cholecalciferol  400 Units Oral Daily   docusate sodium  100 mg Oral BID   enoxaparin (LOVENOX) injection  40 mg Subcutaneous Q24H   fluticasone  2 spray Each Nare Daily  loratadine  10 mg Oral Daily   magnesium oxide  200 mg Oral QPM   montelukast  10 mg Oral QHS   propranolol  40 mg Oral Q1500   rosuvastatin  10 mg Oral Daily   sodium chloride flush  10 mL Intravenous Q12H    PRN meds: diphenhydrAMINE, HYDROmorphone (DILAUDID) injection, LORazepam, melatonin, methocarbamol **OR** methocarbamol (ROBAXIN) injection, metoCLOPramide **OR** metoCLOPramide (REGLAN) injection,  ondansetron **OR** ondansetron (ZOFRAN) IV, oxyCODONE, polyethylene glycol, prochlorperazine   Infusions:    Antimicrobials: Anti-infectives (From admission, onward)    Start     Dose/Rate Route Frequency Ordered Stop   10/21/23 1400  ceFAZolin (ANCEF) IVPB 2g/100 mL premix        2 g 200 mL/hr over 30 Minutes Intravenous Every 8 hours 10/21/23 0955 10/22/23 0614   10/21/23 0841  vancomycin (VANCOCIN) powder  Status:  Discontinued          As needed 10/21/23 0841 10/21/23 0905       Objective: Vitals:   10/23/23 0442 10/23/23 0746  BP: 122/70 126/72  Pulse: 89 94  Resp:  18  Temp: 99 F (37.2 C) 99 F (37.2 C)  SpO2: 90% 92%    Intake/Output Summary (Last 24 hours) at 10/23/2023 1358 Last data filed at 10/23/2023 1045 Gross per 24 hour  Intake 120 ml  Output 1700 ml  Net -1580 ml   Filed Weights   10/19/23 0140 10/21/23 0643  Weight: 74.8 kg 77.1 kg   Weight change:  Body mass index is 27.86 kg/m.   Physical Exam: General exam: Pleasant, elderly, not in distress Skin: No rashes, lesions or ulcers. HEENT: Atraumatic, normocephalic, no obvious bleeding Lungs: Clear to auscultation bilaterally CVS: Regular rate and rhythm, no murmur GI/Abd soft, nontender, nondistended, bowel sound present  CNS: Alert, awake, oriented x 3 Psychiatry: Mood appropriate Extremities: No pedal edema, no calf tenderness  Data Review: I have personally reviewed the laboratory data and studies available.  F/u labs ordered Unresulted Labs (From admission, onward)     Start     Ordered   10/28/23 0500  Creatinine, serum  (enoxaparin (LOVENOX)    CrCl >/= 30 ml/min)  Weekly,   R     Comments: while on enoxaparin therapy    10/21/23 0955   10/22/23 0500  CBC  Daily,   R      10/21/23 0609   10/22/23 0500  Basic metabolic panel  Daily,   R      10/21/23 0609           Total time spent in review of labs and imaging, patient evaluation, formulation of plan, documentation and  communication with family: 45 minutes  Signed, Lorin Glass, MD [facility] 10/23/2023

## 2023-10-23 NOTE — Discharge Instructions (Signed)
Orthopaedic Trauma Service Discharge Instructions   General Discharge Instructions  WEIGHT BEARING STATUS: Touchdown weightbearing left lower extremity  RANGE OF MOTION/ACTIVITY: Okay for unrestricted knee range of motion  Wound Care: You may remove your surgical dressing. Incisions can be left open to air if there is no drainage. Once the incision is completely dry and without drainage, it may be left open to air out.  Showering may begin starting postoperative day #3 (Thursday 10/24/2023).  Clean incision gently with soap and water.  DVT/PE prophylaxis: Aspirin 325 mg daily x 30 days  Diet: as you were eating previously.  Can use over the counter stool softeners and bowel preparations, such as Miralax, to help with bowel movements.  Narcotics can be constipating.  Be sure to drink plenty of fluids  PAIN MEDICATION USE AND EXPECTATIONS  You have likely been given narcotic medications to help control your pain.  After a traumatic event that results in an fracture (broken bone) with or without surgery, it is ok to use narcotic pain medications to help control one's pain.  We understand that everyone responds to pain differently and each individual patient will be evaluated on a regular basis for the continued need for narcotic medications. Ideally, narcotic medication use should last no more than 6-8 weeks (coinciding with fracture healing).   As a patient it is your responsibility as well to monitor narcotic medication use and report the amount and frequency you use these medications when you come to your office visit.   We would also advise that if you are using narcotic medications, you should take a dose prior to therapy to maximize you participation.  IF YOU ARE ON NARCOTIC MEDICATIONS IT IS NOT PERMISSIBLE TO OPERATE A MOTOR VEHICLE (MOTORCYCLE/CAR/TRUCK/MOPED) OR HEAVY MACHINERY DO NOT MIX NARCOTICS WITH OTHER CNS (CENTRAL NERVOUS SYSTEM) DEPRESSANTS SUCH AS ALCOHOL   STOP SMOKING  OR USING NICOTINE PRODUCTS!!!!  As discussed nicotine severely impairs your body's ability to heal surgical and traumatic wounds but also impairs bone healing.  Wounds and bone heal by forming microscopic blood vessels (angiogenesis) and nicotine is a vasoconstrictor (essentially, shrinks blood vessels).  Therefore, if vasoconstriction occurs to these microscopic blood vessels they essentially disappear and are unable to deliver necessary nutrients to the healing tissue.  This is one modifiable factor that you can do to dramatically increase your chances of healing your injury.    (This means no smoking, no nicotine gum, patches, etc)  DO NOT USE NONSTEROIDAL ANTI-INFLAMMATORY DRUGS (NSAID'S)  Using products such as Advil (ibuprofen), Aleve (naproxen), Motrin (ibuprofen) for additional pain control during fracture healing can delay and/or prevent the healing response.  If you would like to take over the counter (OTC) medication, Tylenol (acetaminophen) is ok.  However, some narcotic medications that are given for pain control contain acetaminophen as well. Therefore, you should not exceed more than 4000 mg of tylenol in a day if you do not have liver disease.  Also note that there are may OTC medicines, such as cold medicines and allergy medicines that my contain tylenol as well.  If you have any questions about medications and/or interactions please ask your doctor/PA or your pharmacist.      ICE AND ELEVATE INJURED/OPERATIVE EXTREMITY  Using ice and elevating the injured extremity above your heart can help with swelling and pain control.  Icing in a pulsatile fashion, such as 20 minutes on and 20 minutes off, can be followed.    Do not place ice directly  on skin. Make sure there is a barrier between to skin and the ice pack.    Using frozen items such as frozen peas works well as the conform nicely to the are that needs to be iced.  USE AN ACE WRAP OR TED HOSE FOR SWELLING CONTROL  In addition to  icing and elevation, Ace wraps or TED hose are used to help limit and resolve swelling.  It is recommended to use Ace wraps or TED hose until you are informed to stop.    When using Ace Wraps start the wrapping distally (farthest away from the body) and wrap proximally (closer to the body)   Example: If you had surgery on your leg or thing and you do not have a splint on, start the ace wrap at the toes and work your way up to the thigh        If you had surgery on your upper extremity and do not have a splint on, start the ace wrap at your fingers and work your way up to the upper arm   CALL THE OFFICE FOR MEDICATION REFILLS OR WITH ANY QUESTIONS/CONCERNS: [phone]   VISIT OUR WEBSITE FOR ADDITIONAL INFORMATION: [email or web address]   Discharge Wound Care Instructions  Do NOT apply any ointments, solutions or lotions to pin sites or surgical wounds.  These prevent needed drainage and even though solutions like hydrogen peroxide kill bacteria, they also damage cells lining the pin sites that help fight infection.  Applying lotions or ointments can keep the wounds moist and can cause them to breakdown and open up as well. This can increase the risk for infection. When in doubt call the office.   If any drainage is noted, use foam dressings (Mepilex) - These dressing supplies should be available at local medical supply stores [facility], [facility], etc) as well as Insurance claims handler ([employer], [facility], [employer], etc)  Once the incision is completely dry and without drainage, it may be left open to air out.  Showering may begin 36-48 hours later.  Cleaning gently with soap and water.   General discharge instructions: Follow with Primary MD Pincus Sanes, MD in 7 days  Please request your PCP  to go over your hospital tests, procedures, radiology results at the follow up. Please get your medicines reviewed and adjusted.  Your PCP may decide to repeat certain labs or tests as needed. Do not  drive, operate heavy machinery, perform activities at heights, swimming or participation in water activities or provide baby sitting services if your were admitted for syncope or siezures until you have seen by Primary MD or a Neurologist and advised to do so again. [place] Controlled Substance Reporting System database was reviewed. Do not drive, operate heavy machinery, perform activities at heights, swim, participate in water activities or provide baby-sitting services while on medications for pain, sleep and mood until your outpatient physician has reevaluated you and advised to do so again.  You are strongly recommended to comply with the dose, frequency and duration of prescribed medications. Activity: As tolerated with Full fall precautions use walker/cane & assistance as needed Avoid using any recreational substances like cigarette, tobacco, alcohol, or non-prescribed drug. If you experience worsening of your admission symptoms, develop shortness of breath, life threatening emergency, suicidal or homicidal thoughts you must seek medical attention immediately by calling 911 or calling your MD immediately  if symptoms less severe. You must read complete instructions/literature along with all the possible adverse reactions/side effects for all  the medicines you take and that have been prescribed to you. Take any new medicine only after you have completely understood and accepted all the possible adverse reactions/side effects.  Wear Seat belts while driving. You were cared for by a hospitalist during your hospital stay. If you have any questions about your discharge medications or the care you received while you were in the hospital after you are discharged, you can call the unit and ask to speak with the hospitalist or the covering physician. Once you are discharged, your primary care physician will handle any further medical issues. Please note that NO REFILLS for any discharge medications will  be authorized once you are discharged, as it is imperative that you return to your primary care physician (or establish a relationship with a primary care physician if you do not have one).

## 2023-10-23 NOTE — Progress Notes (Addendum)
Physical Therapy Treatment Patient Details Name: Heather Rivera MRN: [id number] DOB: [birth year] Today's Date: 10/23/2023   History of Present Illness Pt is a 72 y.o. F who presents 10/19/2023 after a fall with left intra-articular distal femur fracture now s/p ORIF 12/9 and left patella fracture (non operative management). R ankle avulsion fx from distal aspect of R calcaneus. PMH includes: essential tremor.    PT Comments  Pt progressing well towards her physical therapy goals and is eager to participate. Pt requiring contact guard assist-min assist for functional mobility. Session focused on transfer training, wheelchair propulsion and instruction on parts/management, and education. Pt demonstrates good adherence to weightbearing precautions. Reports left knee stiffness. Recommend HHPT at d/c.    If plan is discharge home, recommend the following: A little help with walking and/or transfers;A little help with bathing/dressing/bathroom;Assistance with cooking/housework;Help with stairs or ramp for entrance;Assist for transportation   Can travel by private vehicle        Equipment Recommendations  Rolling walker (2 wheels);BSC/3in1;Wheelchair (measurements PT);Wheelchair cushion (measurements PT)    Recommendations for Other Services       Precautions / Restrictions Precautions Precautions: Fall Required Braces or Orthoses: Other Brace Other Brace: R soft ankle brace Restrictions Weight Bearing Restrictions: Yes LLE Weight Bearing: Touchdown weight bearing     Mobility  Bed Mobility Overal bed mobility: Modified Independent             General bed mobility comments: Pt maneuvering LLE off edge of bed with assist of arms    Transfers Overall transfer level: Needs assistance Equipment used: Rolling walker (2 wheels) Transfers: Sit to/from Stand, Bed to chair/wheelchair/BSC Sit to Stand: Min assist, +2 physical assistance, Contact guard assist     Squat pivot transfers:  Min assist     General transfer comment: Pt performed squat pivot transfer from bed > w/c and w/c to chair. Verbal cues for positioning/set up and hand placement. CGA - minA for transitions    Ambulation/Gait Ambulation/Gait assistance: Min assist, +2 safety/equipment Gait Distance (Feet): 3 Feet Assistive device: Rolling walker (2 wheels) Gait Pattern/deviations: Step-to pattern       General Gait Details: Hop to pattern with decreased foot clearance   Psychologist, counselling mobility: Yes Wheelchair propulsion: Both upper extremities Wheelchair parts: Supervision/cueing Distance: 50 Wheelchair Assistance Details (indicate cue type and reason): Verbal instruction for w/c propulsion and parts/management   Tilt Bed    Modified Rankin (Stroke Patients Only)       Balance Overall balance assessment: Needs assistance Sitting-balance support: No upper extremity supported, Feet supported Sitting balance-Leahy Scale: Good     Standing balance support: Bilateral upper extremity supported, During functional activity Standing balance-Leahy Scale: Poor Standing balance comment: relies on RW and external support                            Cognition Arousal: Alert Behavior During Therapy: WFL for tasks assessed/performed Overall Cognitive Status: Within Functional Limits for tasks assessed                                          Exercises      General Comments        Pertinent Vitals/Pain Pain Assessment Pain Assessment: Faces Faces Pain Scale: Hurts  even more Pain Location: L LE Pain Descriptors / Indicators: Discomfort, Grimacing, Operative site guarding (stiff) Pain Intervention(s): Monitored during session    Home Living                          Prior Function            PT Goals (current goals can now be found in the care plan section) Acute Rehab PT  Goals Patient Stated Goal: to go home PT Goal Formulation: With patient/family Time For Goal Achievement: 11/05/23 Potential to Achieve Goals: Good Progress towards PT goals: Progressing toward goals    Frequency    Min 1X/week      PT Plan      Co-evaluation              AM-PAC PT "6 Clicks" Mobility   Outcome Measure  Help needed turning from your back to your side while in a flat bed without using bedrails?: None Help needed moving from lying on your back to sitting on the side of a flat bed without using bedrails?: None Help needed moving to and from a bed to a chair (including a wheelchair)?: A Little Help needed standing up from a chair using your arms (e.g., wheelchair or bedside chair)?: A Little Help needed to walk in hospital room?: Total Help needed climbing 3-5 steps with a railing? : Total 6 Click Score: 16    End of Session Equipment Utilized During Treatment: Gait belt Activity Tolerance: Patient tolerated treatment well Patient left: in chair;with call bell/phone within reach;with chair alarm set Nurse Communication: Mobility status PT Visit Diagnosis: Pain;Difficulty in walking, not elsewhere classified (R26.2) Pain - Right/Left: Left Pain - part of body: Knee     Time: 1227-1257 PT Time Calculation (min) (ACUTE ONLY): 30 min  Charges:    $Therapeutic Activity: 23-37 mins PT General Charges $$ ACUTE PT VISIT: 1 Visit                     Lillia Pauls, PT, DPT Acute Rehabilitation Services Office [phone]    Norval Morton 10/23/2023, 3:04 PM

## 2023-10-23 NOTE — Progress Notes (Signed)
Occupational Therapy Treatment Patient Details Name: Heather Rivera MRN: [id number] DOB: 12/06/1950 Today's Date: 10/23/2023   History of present illness Pt is a 72 y.o. F who presents 10/19/2023 after a fall with left intra-articular distal femur fracture now s/p ORIF 12/9 and left patella fracture (non operative management). R ankle avulsion fx from distal aspect of R calcaneus. PMH includes: essential tremor.   OT comments  Pt greeted seated in recliner, having just worked with PT. Pt declined functional mobility but agreeable to educational session in regards to AE for ADLs and DME for home. Pt reports that she is not sure about the size of her daughters shower but agreeable to shower seat. Pt hoping BSC will fit, but additionally provided handout on available shower seats with Bilateral arm rests. Additionally demo'ed all LB AE for bathing and dressing with pt receptive to all education. Pt would continue to benefit from skilled occupational therapy while admitted and after d/c to address the below listed limitations in order to improve overall functional mobility and facilitate independence with BADL participation. DC plan remains appropriate, will follow acutely per POC.        If plan is discharge home, recommend the following:  A lot of help with bathing/dressing/bathroom;A lot of help with walking and/or transfers;Assistance with cooking/housework;Assist for transportation;Help with stairs or ramp for entrance   Equipment Recommendations  BSC/3in1;Other (comment) (RW)    Recommendations for Other Services      Precautions / Restrictions Precautions Precautions: Fall Required Braces or Orthoses: Other Brace Other Brace: R soft ankle brace Restrictions Weight Bearing Restrictions: Yes LLE Weight Bearing: Touchdown weight bearing (per ortho note 12/11)       Mobility Bed Mobility               General bed mobility comments: pt seated in recliner    Transfers                          Balance                                           ADL either performed or assessed with clinical judgement   ADL Overall ADL's : Needs assistance/impaired                                       General ADL Comments: session focused on education related to AE for ADL participation. full demo and education provided on all LB AE for bathing/dressing. education provided on DME with pt reporting that she wasnt sure how big her daughters shower was but agreeable to shower seat. pt hoping BSC can be used as shower seat but additionally provided resources for other shower seat options, pt very receptive to edcuation but declined functional mobility as this time    Extremity/Trunk Assessment Upper Extremity Assessment Upper Extremity Assessment: Overall WFL for tasks assessed   Lower Extremity Assessment Lower Extremity Assessment: Defer to PT evaluation        Vision Baseline Vision/History: 0 No visual deficits     Perception     Praxis      Cognition Arousal: Alert Behavior During Therapy: WFL for tasks assessed/performed Overall Cognitive Status: Within Functional Limits for tasks assessed  Exercises      Shoulder Instructions       General Comments      Pertinent Vitals/ Pain       Pain Assessment Pain Assessment: No/denies pain  Home Living                                          Prior Functioning/Environment              Frequency  Min 1X/week        Progress Toward Goals  OT Goals(current goals can now be found in the care plan section)  Progress towards OT goals: Progressing toward goals  Acute Rehab OT Goals Patient Stated Goal: to get stronger OT Goal Formulation: With patient Time For Goal Achievement: 11/05/23 Potential to Achieve Goals: Good  Plan      Co-evaluation                 AM-PAC OT  "6 Clicks" Daily Activity     Outcome Measure   Help from another person eating meals?: None Help from another person taking care of personal grooming?: A Little Help from another person toileting, which includes using toliet, bedpan, or urinal?: A Lot Help from another person bathing (including washing, rinsing, drying)?: A Lot Help from another person to put on and taking off regular upper body clothing?: A Little Help from another person to put on and taking off regular lower body clothing?: A Lot 6 Click Score: 16    End of Session    OT Visit Diagnosis: Other abnormalities of gait and mobility (R26.89);Muscle weakness (generalized) (M62.81);Pain   Activity Tolerance Patient tolerated treatment well   Patient Left in chair;with call bell/phone within reach;with chair alarm set;with nursing/sitter in room   Nurse Communication Mobility status        Time: 1324-4010 OT Time Calculation (min): 19 min  Charges: OT General Charges $OT Visit: 1 Visit OT Treatments $Self Care/Home Management : 8-22 mins  Lenor Derrick., COTA/L Acute Rehabilitation Services [phone]   Barron Schmid 10/23/2023, 2:56 PM

## 2023-10-24 ENCOUNTER — Encounter (HOSPITAL_COMMUNITY): Payer: Self-pay | Admitting: Student

## 2023-10-24 DIAGNOSIS — S72492A Other fracture of lower end of left femur, initial encounter for closed fracture: Secondary | ICD-10-CM | POA: Diagnosis not present

## 2023-10-24 LAB — BASIC METABOLIC PANEL
Anion gap: 7 (ref 5–15)
BUN: 13 mg/dL (ref 8–23)
CO2: 27 mmol/L (ref 22–32)
Calcium: 8.9 mg/dL (ref 8.9–10.3)
Chloride: 102 mmol/L (ref 98–111)
Creatinine, Ser: 0.68 mg/dL (ref 0.44–1.00)
GFR, Estimated: >60 mL/min (ref >60)
Glucose, Bld: 112 mg/dL — ABNORMAL HIGH (ref 70–99)
Potassium: 3.9 mmol/L (ref 3.5–5.1)
Sodium: 136 mmol/L (ref 135–145)

## 2023-10-24 LAB — CBC
HCT: 30.7 % — ABNORMAL LOW (ref 36.0–46.0)
Hemoglobin: 10.5 g/dL — ABNORMAL LOW (ref 12.0–15.0)
MCH: 30.1 pg (ref 26.0–34.0)
MCHC: 34.2 g/dL (ref 30.0–36.0)
MCV: 88 fL (ref 80.0–100.0)
Platelets: 248 10*3/uL (ref 150–400)
RBC: 3.49 MIL/uL — ABNORMAL LOW (ref 3.87–5.11)
RDW: 13.8 % (ref 11.5–15.5)
WBC: 5.6 10*3/uL (ref 4.0–10.5)
nRBC: 0 % (ref 0.0–0.2)

## 2023-10-24 NOTE — Discharge Summary (Addendum)
Physician Discharge Summary  [name] XBM:[id number] DOB: 11-08-1951 DOA: 10/19/2023  PCP: Pincus Sanes, MD  Admit date: 10/19/2023 Discharge date: 10/25/2023  Admitted From: Home Discharge disposition: Home with home with PT  Recommendations at discharge:  Continue pain control with scheduled Tylenol, as needed oxycodone, Robaxin Per orthopedics, allowed for touchdown weightbearing with left lower extremity.  Okay for unrestricted knee motion as tolerated. Post discharge DVT prophylaxis plan with aspirin 325 mg for 30 days. To follow-up with orthopedics in 2 weeks for repeat x-rays and wound check. Home with PT ordered  Brief narrative: Heather Rivera is a 72 y.o. female with PMH significant for hyperlipidemia, benign tremors, prediabetes, osteopenia 12/7, patient initially presented at [facility], ED after mechanical fall, no syncopal episode.  She reported tripping over a curb when trying to get into her car and immediately noted pain and inability to bear weight on the left lower extremity, pain in the right heel and pain in the right wrist while she tried to catch herself as she fell.  She reported being able to lift herself into her car and drove home and noted that she was in more pain than anticipated.  EMS was called.  X-ray left knee showed oblique fracture through the left lateral femoral condyle CT left knee showed vertical fracture through the distal femur metaphysis of the femur extending into the femoral condyle adjacent to the internal condylar notch with displacement and a commuted fracture of the lateral aspect patella. X-ray right ankle showed an avulsion fracture from the distal aspect of the right calcaneus  Orthopedics was consulted Patient was transferred to [facility] for surgery.  Admitted to [facility]  Subjective: Patient was seen and examined this morning.  Participating with PT.  Daughter was at bedside.  Tentative plan of home with home health  today  Hospital course: Left lateral femoral condyle fracture Commuted nondisplaced fracture of left patella S/p ORIF left distal femur fracture 12/9 Dr. Jena Gauss Nonoperative management of left patella fracture.  Knee immobilizer in place. Currently on pain control with scheduled Tylenol, as needed oxycodone, Robaxin PT eval was obtained.  Home with PT recommended with rolling walker.  Physical therapy worked with the patient while patient daughter was in the room today. Per orthopedics, allowed for touchdown weightbearing with left lower extremity.  Okay for unrestricted knee motion as tolerated. Currently on Lovenox subcu for DVT prophylaxis.  Post discharge DVT prophylaxis plan with aspirin 325 mg for 30 days. To follow-up with orthopedics in 2 weeks for repeat x-rays and wound check.  Avulsion fracture from distal aspect of the right calcaneus Per patient, she has discussed this with orthopedic surgeon and the plan is nonsurgical management.  She is allowed to bear weight on it.  Benign essential tremor Continue propranolol   HLD Continue Crestor   Goals of care   Code Status: Full Code   Diet:  Diet Order             Diet general           Diet Heart Room service appropriate? Yes; Fluid consistency: Thin  Diet effective now                   Nutritional status:  Body mass index is 27.86 kg/m.       Wounds:  - Incision (Closed) 10/21/23 Knee Left (Active)  Date First Assessed/Time First Assessed: 10/21/23 0841   Location: Knee  Location Orientation: Left    Assessments 10/21/2023  9:08 AM 10/24/2023 11:00 PM  Dressing Type Compression wrap Compression wrap  Dressing Clean, Dry, Intact Clean, Dry, Intact  Site / Wound Assessment Dressing in place / Unable to assess Dressing in place / Unable to assess  Drainage Amount None None     No associated orders.    Discharge Exam:   Vitals:   10/24/23 1523 10/24/23 2038 10/25/23 0437 10/25/23 0823  BP: 128/82  103/62 129/78 136/78  Pulse: (!) 103 86 89 92  Resp: 17 20 16 16   Temp: 98.3 F (36.8 C) 98.4 F (36.9 C) 98.4 F (36.9 C) 98.5 F (36.9 C)  TempSrc: Oral  Oral Oral  SpO2: 96% 92% 94% 92%  Weight:      Height:        Body mass index is 27.86 kg/m.   General exam: Pleasant, elderly, not in distress Skin: No rashes, lesions or ulcers. HEENT: Atraumatic, normocephalic, no obvious bleeding Lungs: Clear to auscultation bilaterally CVS: Regular rate and rhythm, no murmur GI/Abd soft, nontender, nondistended, bowel sound present  CNS: Alert, awake, oriented x 3 Psychiatry: Mood appropriate Extremities: No pedal edema, no calf tenderness  Follow ups:    Follow-up Information     Pincus Sanes, MD Follow up.   Specialty: Internal Medicine Contact information: [address] [place] [address] [phone]         Roby Lofts, MD. Schedule an appointment as soon as possible for a visit in 2 week(s).   Specialty: Orthopedic Surgery Why: for wound check and repeat x-rays Contact information: [address] [place] [address] [phone]         [facility] Follow up.   Specialty: Home Health Services Why: home health services will be provided by [facility], start of care within 48 hours post discharge Contact information: [address] [place] [address] [phone]                 Discharge Instructions:   Discharge Instructions     Call MD for:  difficulty breathing, headache or visual disturbances   Complete by: As directed    Call MD for:  extreme fatigue   Complete by: As directed    Call MD for:  hives   Complete by: As directed    Call MD for:  persistant dizziness or light-headedness   Complete by: As directed    Call MD for:  persistant nausea and vomiting   Complete by: As directed    Call MD for:  severe uncontrolled pain   Complete by: As directed    Call MD for:  temperature  >100.4   Complete by: As directed    Diet general   Complete by: As directed    Increase activity slowly   Complete by: As directed        Discharge Medications:   Allergies as of 10/25/2023       Reactions   Augmentin [amoxicillin-pot Clavulanate] Nausea And Vomiting, Rash, Other (See Comments)   Rash, N/V        Medication List     STOP taking these medications    ASHWAGANDHA PO       TAKE these medications    acetaminophen 500 MG tablet Commonly known as: TYLENOL Take 1-2 tablets (500-1,000 mg total) by mouth every 6 (six) hours as needed for mild pain (pain score 1-3), fever or headache.   aspirin EC 325 MG tablet Take  1 tablet (325 mg total) by mouth daily.   fluticasone 50 MCG/ACT nasal spray Commonly known as: FLONASE Place 2 sprays into both nostrils daily.   loratadine 10 MG tablet Commonly known as: CLARITIN Take 10 mg by mouth daily.   Magnesium 100 MG Caps Take 200 mg by mouth daily.   methocarbamol 500 MG tablet Commonly known as: ROBAXIN Take 1 tablet (500 mg total) by mouth every 6 (six) hours as needed for muscle spasms.   montelukast 10 MG tablet Commonly known as: SINGULAIR TAKE 1 TABLET BY MOUTH EVERYDAY AT BEDTIME What changed: See the new instructions.   oxyCODONE 5 MG immediate release tablet Commonly known as: Oxy IR/ROXICODONE Take 1 tablet (5 mg total) by mouth every 4 (four) hours as needed for severe pain (pain score 7-10).   polyethylene glycol 17 g packet Commonly known as: MIRALAX / GLYCOLAX Take 17 g by mouth daily as needed for mild constipation.   PROBIOTIC DAILY PO Take 1 tablet by mouth daily.   propranolol 20 MG tablet Commonly known as: INDERAL TAKE 1 TABLET BY MOUTH 2 TIMES DAILY AS NEEDED. What changed: See the new instructions.   rosuvastatin 10 MG tablet Commonly known as: Crestor Take 1 tablet (10 mg total) by mouth daily.   VITAMIN D (CHOLECALCIFEROL) PO Take 1 tablet by mouth daily.                Durable Medical Equipment  (From admission, onward)           Start     Ordered   10/24/23 1146  For home use only DME Walker rolling  Once       Question Answer Comment  Walker: With 5 Inch Wheels   Patient needs a walker to treat with the following condition Gait instability      10/24/23 1145   10/23/23 0842  For home use only DME lightweight manual wheelchair with seat cushion  Once       Comments: Patient suffers from Left femoral fracture which impairs their ability to perform daily activities like bathing and grooming in the home.  A walker will not resolve  issue with performing activities of daily living. A wheelchair will allow patient to safely perform daily activities. Patient is not able to propel themselves in the home using a standard weight wheelchair due to general weakness. Patient can self propel in the lightweight wheelchair. Length of need 12 months . Accessories: elevating leg rests (ELRs), wheel locks, extensions and anti-tippers.   10/23/23 0842   10/23/23 0841  For home use only DME Bedside commode  Once       Comments: Confine to one room  Question:  Patient needs a bedside commode to treat with the following condition  Answer:  Gait instability   10/23/23 0842             The results of significant diagnostics from this hospitalization (including imaging, microbiology, ancillary and laboratory) are listed below for reference.    Procedures and Diagnostic Studies:   DG Wrist 2 Views Right Result Date: 10/19/2023 CLINICAL DATA:  190176 Fall 190176 EXAM: RIGHT WRIST - 2 VIEW COMPARISON:  None Available. FINDINGS: No acute fracture or dislocation. Old healed fracture deformity of fifth metacarpal. Ulnar negative variance. Mild osteopenia. Soft tissues unremarkable. IMPRESSION: 1. No acute findings. 2. Old healed fifth metacarpal fracture. Electronically Signed   By: Corlis Leak M.D.   On: 10/19/2023 10:28   CT Knee Left Wo Contrast  Result  Date: 10/19/2023 CLINICAL DATA:  Recent trip and fall with known distal femoral fracture EXAM: CT OF THE LEFT KNEE WITHOUT CONTRAST TECHNIQUE: Multidetector CT imaging of the left knee was performed according to the standard protocol. Multiplanar CT image reconstructions were also generated. RADIATION DOSE REDUCTION: This exam was performed according to the departmental dose-optimization program which includes automated exposure control, adjustment of the mA and/or kV according to patient size and/or use of iterative reconstruction technique. COMPARISON:  Knee film from earlier in the same day. FINDINGS: Bones/Joint/Cartilage The previously seen vertical fracture through the distal diaphysis into the lateral femoral condyle is again identified with only mild displacement. The patella also demonstrates evidence of a comminuted fracture with only minimal displacement. This was not well appreciated on the prior plain film. No other fractures are seen. Ligaments Suboptimally assessed by CT. Muscles and Tendons Surrounding musculature is within normal limits. Soft tissues Joint effusion is seen.  No other soft tissue abnormality is noted. IMPRESSION: Vertical fracture through the distal femoral metaphysis of the femur extending into the femoral condyle adjacent to the intercondylar notch. About 2 mm of superior displacement at the fracture site is noted. Additionally, a comminuted fracture of the lateral aspect of the patella is noted with only minimal displacement identified. Electronically Signed   By: Alcide Clever M.D.   On: 10/19/2023 03:49   DG Ankle Complete Right Result Date: 10/19/2023 CLINICAL DATA:  Recent fall with ankle pain, initial encounter EXAM: RIGHT ANKLE - COMPLETE 3+ VIEW COMPARISON:  None Available. FINDINGS: Small avulsion fracture is noted laterally likely related to the distal aspect of the calcaneus. Mild adjacent soft tissue swelling is noted. No other focal abnormality is noted. IMPRESSION:  Avulsion fracture from distal aspect of the calcaneus. Electronically Signed   By: Alcide Clever M.D.   On: 10/19/2023 02:35   DG Knee Complete 4 Views Left Result Date: 10/19/2023 CLINICAL DATA:  Recent fall with knee pain, initial encounter EXAM: LEFT KNEE - COMPLETE 4+ VIEW COMPARISON:  None Available. FINDINGS: Proximal tibia and fibular are within normal limits. Oblique fracture is noted through the lateral femoral condyle extending into the intercondylar notch. Small joint effusion is noted. Patella appears within normal limits. IMPRESSION: Oblique fracture through the lateral femoral condyle as described. Electronically Signed   By: Alcide Clever M.D.   On: 10/19/2023 02:28     Labs:   Basic Metabolic Panel: Recent Labs  Lab 10/19/23 0315 10/20/23 0317 10/21/23 1040 10/22/23 0541 10/23/23 0525 10/24/23 0517  NA 138 134*  --  136 132* 136  K 3.7 3.7  --  4.3 3.9 3.9  CL 104 102  --  101 102 102  CO2 22 23  --  26 24 27   GLUCOSE 111* 110*  --  107* 115* 112*  BUN 22 16  --  13 10 13   CREATININE 0.69 0.68 0.84 0.91 0.71 0.68  CALCIUM 9.6 8.7*  --  9.1 8.7* 8.9   GFR Estimated Creatinine Clearance: 64.1 mL/min (by C-G formula based on SCr of 0.68 mg/dL). Liver Function Tests: No results for input(s): "AST", "ALT", "ALKPHOS", "BILITOT", "PROT", "ALBUMIN" in the last 168 hours. No results for input(s): "LIPASE", "AMYLASE" in the last 168 hours. No results for input(s): "AMMONIA" in the last 168 hours. Coagulation profile No results for input(s): "INR", "PROTIME" in the last 168 hours.  CBC: Recent Labs  Lab 10/19/23 0315 10/20/23 0317 10/21/23 1040 10/22/23 0541 10/23/23 0525 10/24/23 0517  WBC 9.3  6.8 8.4 6.4 5.8 5.6  NEUTROABS 7.2  --   --   --   --   --   HGB 12.8 11.4* 11.7* 10.9* 10.8* 10.5*  HCT 38.1 35.4* 34.6* 32.7* 31.8* 30.7*  MCV 88.0 92.2 88.9 88.9 88.1 88.0  PLT 293 230 238 222 250 248   Cardiac Enzymes: No results for input(s): "CKTOTAL", "CKMB",  "CKMBINDEX", "TROPONINI" in the last 168 hours. BNP: Invalid input(s): "POCBNP" CBG: No results for input(s): "GLUCAP" in the last 168 hours. D-Dimer No results for input(s): "DDIMER" in the last 72 hours. Hgb A1c No results for input(s): "HGBA1C" in the last 72 hours. Lipid Profile No results for input(s): "CHOL", "HDL", "LDLCALC", "TRIG", "CHOLHDL", "LDLDIRECT" in the last 72 hours. Thyroid function studies No results for input(s): "TSH", "T4TOTAL", "T3FREE", "THYROIDAB" in the last 72 hours.  Invalid input(s): "FREET3" Anemia work up No results for input(s): "VITAMINB12", "FOLATE", "FERRITIN", "TIBC", "IRON", "RETICCTPCT" in the last 72 hours. Microbiology Recent Results (from the past 240 hours)  Surgical pcr screen     Status: None   Collection Time: 10/20/23 10:13 PM   Specimen: Nasal Mucosa; Nasal Swab  Result Value Ref Range Status   MRSA, PCR NEGATIVE NEGATIVE Final   Staphylococcus aureus NEGATIVE NEGATIVE Final    Comment: (NOTE) The Xpert SA Assay (FDA approved for NASAL specimens in patients 9 years of age and older), is one component of a comprehensive surveillance program. It is not intended to diagnose infection nor to guide or monitor treatment. Performed at [facility], [address] [place], [place] [address]     Time coordinating discharge: 45 minutes  Signed: Ever Gustafson  [facility] 10/25/2023, 11:31 AM

## 2023-10-24 NOTE — Progress Notes (Addendum)
Physical Therapy Treatment Patient Details Name: Heather Rivera MRN: [id number] DOB: 1950-12-24 Today's Date: 10/24/2023   History of Present Illness Pt is a 72 y.o. F who presents 10/19/2023 after a fall with left intra-articular distal femur fracture now s/p ORIF 12/9 and left patella fracture (non operative management). R ankle avulsion fx from distal aspect of R calcaneus. PMH includes: essential tremor.    PT Comments  Session focused on family training/education with pt daughter present. Pt overall is progressing very well; now is supervision for squat pivot vs stand pivot transfers using a walker and propels wheelchair with bilateral upper extremities a household distance. Hopping ~5 ft with walker and chair follow with decreased right foot clearance. Wheelchair will be pt main mode of mobility upon d/c home. Visual and verbal demonstration provided to pt and pt daughter regarding wheelchair parts/management, bumping up step with wheelchair and car transfer technique. Discussed home set up and 3 in 1 uses as well. Recommend HHPT at d/c.   If plan is discharge home, recommend the following: A little help with walking and/or transfers;A little help with bathing/dressing/bathroom;Assistance with cooking/housework;Help with stairs or ramp for entrance;Assist for transportation   Can travel by private vehicle        Equipment Recommendations  Rolling walker (2 wheels);BSC/3in1;Wheelchair (measurements PT);Wheelchair cushion (measurements PT)    Recommendations for Other Services       Precautions / Restrictions Precautions Precautions: Fall Restrictions Weight Bearing Restrictions Per Provider Order: Yes LLE Weight Bearing Per Provider Order: Touchdown weight bearing     Mobility  Bed Mobility Overal bed mobility: Modified Independent             General bed mobility comments: Pt maneuvering LLE off edge of bed with assist of arms    Transfers Overall transfer level: Needs  assistance Equipment used: Rolling walker (2 wheels) Transfers: Sit to/from Stand, Bed to chair/wheelchair/BSC Sit to Stand: Supervision     Squat pivot transfers: Supervision     General transfer comment: Pt performed squat pivot transfer from bed > w/c and stand pivot transfer w/c to bed with RW. Supervision for safety. No physical assist required    Ambulation/Gait Ambulation/Gait assistance: +2 safety/equipment, Contact guard assist Gait Distance (Feet): 5 Feet Assistive device: Rolling walker (2 wheels) Gait Pattern/deviations: Step-to pattern       General Gait Details: Hop to pattern with decreased foot clearance   Psychologist, counselling mobility: Yes Wheelchair propulsion: Both upper extremities Wheelchair parts: Supervision/cueing Distance: 200 Wheelchair Assistance Details (indicate cue type and reason): Verbal cues for turns   Tilt Bed    Modified Rankin (Stroke Patients Only)       Balance Overall balance assessment: Needs assistance Sitting-balance support: No upper extremity supported, Feet supported Sitting balance-Leahy Scale: Good     Standing balance support: Bilateral upper extremity supported, During functional activity Standing balance-Leahy Scale: Poor Standing balance comment: relies on RW and external support                            Cognition Arousal: Alert Behavior During Therapy: WFL for tasks assessed/performed Overall Cognitive Status: Within Functional Limits for tasks assessed  Exercises      General Comments        Pertinent Vitals/Pain Pain Assessment Pain Assessment: Faces Faces Pain Scale: Hurts even more Pain Location: L LE Pain Descriptors / Indicators: Discomfort, Grimacing, Operative site guarding (stiff) Pain Intervention(s): Limited activity within patient's tolerance, Monitored during  session    Home Living                          Prior Function            PT Goals (current goals can now be found in the care plan section) Acute Rehab PT Goals Patient Stated Goal: to go home PT Goal Formulation: With patient/family Time For Goal Achievement: 11/05/23 Potential to Achieve Goals: Good Progress towards PT goals: Progressing toward goals    Frequency    Min 1X/week      PT Plan      Co-evaluation              AM-PAC PT "6 Clicks" Mobility   Outcome Measure  Help needed turning from your back to your side while in a flat bed without using bedrails?: None Help needed moving from lying on your back to sitting on the side of a flat bed without using bedrails?: None Help needed moving to and from a bed to a chair (including a wheelchair)?: A Little Help needed standing up from a chair using your arms (e.g., wheelchair or bedside chair)?: A Little Help needed to walk in hospital room?: Total Help needed climbing 3-5 steps with a railing? : Total 6 Click Score: 16    End of Session Equipment Utilized During Treatment: Gait belt Activity Tolerance: Patient tolerated treatment well Patient left: with call bell/phone within reach;in bed Nurse Communication: Mobility status PT Visit Diagnosis: Pain;Difficulty in walking, not elsewhere classified (R26.2) Pain - Right/Left: Left Pain - part of body: Knee     Time: 3664-4034 PT Time Calculation (min) (ACUTE ONLY): 57 min  Charges:    $Therapeutic Activity: 23-37 mins $Self Care/Home Management: 23-37 PT General Charges $$ ACUTE PT VISIT: 1 Visit                     Lillia Pauls, PT, DPT Acute Rehabilitation Services Office [phone]    Norval Morton 10/24/2023, 1:12 PM

## 2023-10-24 NOTE — TOC Progression Note (Signed)
Transition of Care [facility]) - Progression Note    Patient Details  Name: Heather Rivera MRN: [id number] Date of Birth: Oct 12, 1951  Transition of Care [facility]) CM/SW Contact  Epifanio Lesches, RN Phone Number: 10/24/2023, 10:37 AM  Clinical Narrative:    Pt to transition home with daughter once medically ready.  Pt agreeable to home health services. Preference : [facility]. Referral made with [facility] and accepted. DME need noted, RW, BSC and W/C. Equipment will be delivered to bedside prior to d/c. Pt without transportation needs or RX MED concern.  Expected Discharge Plan: Home w Home Health Services Barriers to Discharge: Continued Medical Work up  Expected Discharge Plan and Services In-house Referral: Clinical Social Work   Post Acute Care Choice: Home Health Living arrangements for the past 2 months: Single Family Home                           HH Arranged: PT, OT, Nurse's Aide HH Agency: [facility] Date HH Agency Contacted: 10/23/23 Time HH Agency Contacted: [phone] Representative spoke with at [facility] Agency: Haywood Lasso   Social Determinants of Health (SDOH) Interventions SDOH Screenings   Food Insecurity: No Food Insecurity (10/19/2023)  Housing: Low Risk  (10/19/2023)  Transportation Needs: No Transportation Needs (10/19/2023)  Utilities: Not At Risk (10/19/2023)  Depression (PHQ2-9): Low Risk  (06/18/2023)  Financial Resource Strain: Low Risk  (06/05/2018)  Physical Activity: Sufficiently Active (06/05/2018)  Social Connections: Unknown (06/05/2018)  Stress: No Stress Concern Present (06/05/2018)  Tobacco Use: Medium Risk (10/21/2023)    Readmission Risk Interventions     No data to display

## 2023-10-24 NOTE — Plan of Care (Signed)

## 2023-10-25 NOTE — Discharge Planning (Signed)
Discharge packet reviewed with patient and two daughters at bedside, IV access removed, patient being discharged with family member via private transportation.

## 2023-10-25 NOTE — Progress Notes (Signed)
Occupational Therapy Treatment Patient Details Name: Heather Rivera MRN: [id number] DOB: 09/23/1951 Today's Date: 10/25/2023   History of present illness Pt is a 72 y.o. F who presents 10/19/2023 after a fall with left intra-articular distal femur fracture now s/p ORIF 12/9 and left patella fracture (non operative management). R ankle avulsion fx from distal aspect of R calcaneus. PMH includes: essential tremor.   OT comments  Patient received in supine and had performed bathing and dressing with nursing tech. Patient able to get to EOB without assistance and asking to use BSC. Patient able to get from EOB to [facility] with RW with supervision to stand and min assist for transfer and clothing management. Patient able to perform toilet hygiene but required min assist with clothing. Patient transferred to recliner and was provided red therapy band and UE HEP. Patient instructed on BUE HEP with good understanding. Discharge recommendations continue to be appropriate for HHOT.       If plan is discharge home, recommend the following:  A lot of help with bathing/dressing/bathroom;A lot of help with walking and/or transfers;Assistance with cooking/housework;Assist for transportation;Help with stairs or ramp for entrance   Equipment Recommendations  BSC/3in1;Wheelchair (measurements OT);Wheelchair cushion (measurements OT) (RW)    Recommendations for Other Services      Precautions / Restrictions Precautions Precautions: Fall Restrictions Weight Bearing Restrictions Per Provider Order: Yes LLE Weight Bearing Per Provider Order: Touchdown weight bearing       Mobility Bed Mobility Overal bed mobility: Modified Independent             General bed mobility comments: able to get to EOB with increased time    Transfers Overall transfer level: Needs assistance Equipment used: Rolling walker (2 wheels) Transfers: Sit to/from Stand, Bed to chair/wheelchair/BSC Sit to Stand: Supervision, From  elevated surface   Squat pivot transfers: Min assist       General transfer comment: transfer from EOB to [facility] with use of RW and squat pivot transfer with min assist to steady patient     Balance Overall balance assessment: Needs assistance Sitting-balance support: No upper extremity supported, Feet supported Sitting balance-Leahy Scale: Good     Standing balance support: Single extremity supported, Bilateral upper extremity supported, During functional activity Standing balance-Leahy Scale: Poor Standing balance comment: reliant on RW for support when standing but able to use one extremity support for toileting tasks and maintain WB precautions                           ADL either performed or assessed with clinical judgement   ADL Overall ADL's : Needs assistance/impaired                         Toilet Transfer: Minimal assistance;BSC/3in1;Rolling walker (2 wheels) Toilet Transfer Details (indicate cue type and reason): able to maintain WB precautions Toileting- Clothing Manipulation and Hygiene: Minimal assistance;Sit to/from stand Toileting - Clothing Manipulation Details (indicate cue type and reason): able to perform toilet hygiene standing and min assist with clothing management       General ADL Comments: Patient declined self care with female therapist and asked tech to assist but performed toilet transfer    Extremity/Trunk Assessment              Vision       Perception     Praxis      Cognition Arousal: Alert Behavior During Therapy: [facility] for tasks  assessed/performed Overall Cognitive Status: Within Functional Limits for tasks assessed                                          Exercises Exercises: General Upper Extremity General Exercises - Upper Extremity Shoulder Flexion: Strengthening, Both, 10 reps, Seated, Theraband Theraband Level (Shoulder Flexion): Level 2 (Red) Shoulder Horizontal ABduction:  Strengthening, Both, 10 reps, Seated, Theraband Theraband Level (Shoulder Horizontal Abduction): Level 2 (Red) Elbow Flexion: Strengthening, Both, 10 reps, Seated, Theraband Theraband Level (Elbow Flexion): Level 2 (Red) Elbow Extension: Strengthening, Both, 10 reps, Seated, Theraband Theraband Level (Elbow Extension): Level 2 (Red)    Shoulder Instructions       General Comments      Pertinent Vitals/ Pain       Pain Assessment Pain Assessment: Faces Faces Pain Scale: Hurts little more Pain Location: L LE Pain Descriptors / Indicators: Discomfort, Grimacing, Operative site guarding Pain Intervention(s): Limited activity within patient's tolerance, Monitored during session, Premedicated before session, Repositioned  Home Living                                          Prior Functioning/Environment              Frequency  Min 1X/week        Progress Toward Goals  OT Goals(current goals can now be found in the care plan section)  Progress towards OT goals: Progressing toward goals  Acute Rehab OT Goals Patient Stated Goal: go home with daughter OT Goal Formulation: With patient Time For Goal Achievement: 11/05/23 Potential to Achieve Goals: Good ADL Goals Pt Will Perform Grooming: sitting;with modified independence Pt Will Perform Lower Body Dressing: with supervision;sit to/from stand;sitting/lateral leans;with adaptive equipment Pt Will Transfer to Toilet: with supervision;ambulating;bedside commode Pt Will Perform Toileting - Clothing Manipulation and hygiene: with supervision;sitting/lateral leans;sit to/from stand  Plan      Co-evaluation                 AM-PAC OT "6 Clicks" Daily Activity     Outcome Measure   Help from another person eating meals?: None Help from another person taking care of personal grooming?: A Little Help from another person toileting, which includes using toliet, bedpan, or urinal?: A Little Help from  another person bathing (including washing, rinsing, drying)?: A Lot Help from another person to put on and taking off regular upper body clothing?: A Little Help from another person to put on and taking off regular lower body clothing?: A Lot 6 Click Score: 17    End of Session Equipment Utilized During Treatment: Gait belt;Rolling walker (2 wheels)  OT Visit Diagnosis: Other abnormalities of gait and mobility (R26.89);Muscle weakness (generalized) (M62.81);Pain Pain - Right/Left: Left Pain - part of body: Leg;Hip   Activity Tolerance Patient tolerated treatment well   Patient Left in chair;with call bell/phone within reach;Other (comment) (left with PT)   Nurse Communication Mobility status        Time: 0942-1000 OT Time Calculation (min): 18 min  Charges: OT General Charges $OT Visit: 1 Visit OT Treatments $Self Care/Home Management : 8-22 mins  Alfonse Flavors, OTA Acute Rehabilitation Services  Office [phone]   Dewain Penning 10/25/2023, 11:00 AM

## 2023-10-25 NOTE — Progress Notes (Signed)
Patient was all prepared for discharge home yesterday.  Discharge was later held as patient demanded 1 more day for arrangements to be completed at her daughter's house where she will be going.  Briefly seen and examined this morning.  No new change.  Okay to discharge today. [facility] will not bill for today.

## 2023-10-25 NOTE — Progress Notes (Signed)
Physical Therapy Treatment Patient Details Name: Heather Rivera MRN: [id number] DOB: January 25, 1951 Today's Date: 10/25/2023   History of Present Illness Pt is a 72 y.o. F who presents 10/19/2023 after a fall with left intra-articular distal femur fracture now s/p ORIF 12/9 and left patella fracture (non operative management). R ankle avulsion fx from distal aspect of R calcaneus. PMH includes: essential tremor.    PT Comments  Pt received up in chair. Pt instructed on and performed isometric and open chain exercises for LLE ROM and strengthening. Written handout provided. Also reviewed wheelchair parts and management. Pt reports no further questions or concerns. Recommend follow up HHPT.   If plan is discharge home, recommend the following: A little help with walking and/or transfers;A little help with bathing/dressing/bathroom;Assistance with cooking/housework;Help with stairs or ramp for entrance;Assist for transportation   Can travel by private vehicle        Equipment Recommendations  Rolling walker (2 wheels);BSC/3in1;Wheelchair (measurements PT);Wheelchair cushion (measurements PT)    Recommendations for Other Services       Precautions / Restrictions Precautions Precautions: Fall Restrictions Weight Bearing Restrictions Per Provider Order: Yes LLE Weight Bearing Per Provider Order: Touchdown weight bearing     Mobility  Bed Mobility               General bed mobility comments: OOB in chair    Transfers                   General transfer comment: deferred this session (focus on HEP and education)    Ambulation/Gait                   Stairs             Wheelchair Mobility     Tilt Bed    Modified Rankin (Stroke Patients Only)       Balance                                            Cognition Arousal: Alert Behavior During Therapy: WFL for tasks assessed/performed Overall Cognitive Status: Within Functional  Limits for tasks assessed                                          Exercises General Exercises - Lower Extremity Quad Sets: Left, 10 reps, Seated Long Arc Quad: Left, 10 reps, Seated Heel Slides: Left, 10 reps, Seated Hip ABduction/ADduction: Left, 10 reps, Seated    General Comments        Pertinent Vitals/Pain Pain Assessment Pain Assessment: Faces Faces Pain Scale: Hurts little more Pain Location: L LE Pain Descriptors / Indicators: Discomfort, Grimacing, Operative site guarding (stiff) Pain Intervention(s): Monitored during session    Home Living                          Prior Function            PT Goals (current goals can now be found in the care plan section) Acute Rehab PT Goals Patient Stated Goal: to go home PT Goal Formulation: With patient/family Time For Goal Achievement: 11/05/23 Potential to Achieve Goals: Good Progress towards PT goals: Progressing toward goals    Frequency    Min 1X/week  PT Plan      Co-evaluation              AM-PAC PT "6 Clicks" Mobility   Outcome Measure  Help needed turning from your back to your side while in a flat bed without using bedrails?: None Help needed moving from lying on your back to sitting on the side of a flat bed without using bedrails?: None Help needed moving to and from a bed to a chair (including a wheelchair)?: A Little Help needed standing up from a chair using your arms (e.g., wheelchair or bedside chair)?: A Little Help needed to walk in hospital room?: Total Help needed climbing 3-5 steps with a railing? : Total 6 Click Score: 16    End of Session Equipment Utilized During Treatment: Gait belt Activity Tolerance: Patient tolerated treatment well Patient left: with call bell/phone within reach;in bed Nurse Communication: Mobility status PT Visit Diagnosis: Pain;Difficulty in walking, not elsewhere classified (R26.2) Pain - Right/Left: Left Pain - part  of body: Knee     Time: 1000-1020 PT Time Calculation (min) (ACUTE ONLY): 20 min  Charges:    $Therapeutic Exercise: 8-22 mins PT General Charges $$ ACUTE PT VISIT: 1 Visit                     Lillia Pauls, PT, DPT Acute Rehabilitation Services Office [phone]    Norval Morton 10/25/2023, 11:35 AM

## 2023-10-26 DIAGNOSIS — G250 Essential tremor: Secondary | ICD-10-CM | POA: Diagnosis not present

## 2023-10-26 DIAGNOSIS — E669 Obesity, unspecified: Secondary | ICD-10-CM | POA: Diagnosis not present

## 2023-10-26 DIAGNOSIS — Z6828 Body mass index (BMI) 28.0-28.9, adult: Secondary | ICD-10-CM | POA: Diagnosis not present

## 2023-10-26 DIAGNOSIS — W101XXD Fall (on)(from) sidewalk curb, subsequent encounter: Secondary | ICD-10-CM | POA: Diagnosis not present

## 2023-10-26 DIAGNOSIS — M8580 Other specified disorders of bone density and structure, unspecified site: Secondary | ICD-10-CM | POA: Diagnosis not present

## 2023-10-26 DIAGNOSIS — Z9181 History of falling: Secondary | ICD-10-CM | POA: Diagnosis not present

## 2023-10-26 DIAGNOSIS — R7303 Prediabetes: Secondary | ICD-10-CM | POA: Diagnosis not present

## 2023-10-26 DIAGNOSIS — S72425D Nondisplaced fracture of lateral condyle of left femur, subsequent encounter for closed fracture with routine healing: Secondary | ICD-10-CM | POA: Diagnosis not present

## 2023-10-26 DIAGNOSIS — Z7982 Long term (current) use of aspirin: Secondary | ICD-10-CM | POA: Diagnosis not present

## 2023-10-26 DIAGNOSIS — S82045D Nondisplaced comminuted fracture of left patella, subsequent encounter for closed fracture with routine healing: Secondary | ICD-10-CM | POA: Diagnosis not present

## 2023-10-26 DIAGNOSIS — E785 Hyperlipidemia, unspecified: Secondary | ICD-10-CM | POA: Diagnosis not present

## 2023-10-26 DIAGNOSIS — S92031D Displaced avulsion fracture of tuberosity of right calcaneus, subsequent encounter for fracture with routine healing: Secondary | ICD-10-CM | POA: Diagnosis not present

## 2023-10-28 ENCOUNTER — Telehealth: Payer: Self-pay | Admitting: *Deleted

## 2023-10-28 ENCOUNTER — Telehealth: Payer: Self-pay | Admitting: Internal Medicine

## 2023-10-28 NOTE — Transitions of Care (Post Inpatient/ED Visit) (Signed)
10/28/2023  Name: Heather Rivera MRN: [id number] DOB: 08-22-1951  Today's TOC FU Call Status: Today's TOC FU Call Status:: Successful TOC FU Call Completed TOC FU Call Complete Date: 10/28/23 Patient's Name and Date of Birth confirmed.  Transition Care Management Follow-up Telephone Call Date of Discharge: 10/25/23 Discharge Facility: [facility]) Type of Discharge: Inpatient Admission Primary Inpatient Discharge Diagnosis:: Femoral distal fracture How have you been since you were released from the hospital?: Better Any questions or concerns?: No  Items Reviewed: Medications obtained,verified, and reconciled?: Yes (Medications Reviewed) Any new allergies since your discharge?: No Dietary orders reviewed?: No Do you have support at home?: Yes People in Home: alone Name of Support/Comfort Primary Source: staying with daughter Logan Bores  Medications Reviewed Today: Medications Reviewed Today     Reviewed by Luella Cook, RN (Case Manager) on 10/28/23 at 1048  Med List Status: <None>   Medication Order Taking? Sig Documenting Provider Last Dose Status Informant  acetaminophen (TYLENOL) 500 MG tablet [id number] Yes Take 1-2 tablets (500-1,000 mg total) by mouth every 6 (six) hours as needed for mild pain (pain score 1-3), fever or headache. Olene Floss Taking Active   aspirin EC 325 MG tablet [id number] Yes Take 1 tablet (325 mg total) by mouth daily. West Bali, PA-C Taking Active   fluticasone (FLONASE) 50 MCG/ACT nasal spray [id number] Yes Place 2 sprays into both nostrils daily. Myrlene Broker, MD Taking Active Self, Pharmacy Records  loratadine [facility]) 10 MG tablet [id number] Yes Take 10 mg by mouth daily. [provider] Taking Active Self, Pharmacy Records  Magnesium 100 MG CAPS [id number] Yes Take 200 mg by mouth daily. [provider] Taking Active Self, Pharmacy Records  methocarbamol (ROBAXIN) 500 MG tablet [id number] Yes Take 1  tablet (500 mg total) by mouth every 6 (six) hours as needed for muscle spasms. West Bali, PA-C Taking Active   montelukast (SINGULAIR) 10 MG tablet [id number] Yes TAKE 1 TABLET BY MOUTH EVERYDAY AT BEDTIME  Patient taking differently: Take 10 mg by mouth at bedtime.   Pincus Sanes, MD Taking Active Self, Pharmacy Records  oxyCODONE (OXY IR/ROXICODONE) 5 MG immediate release tablet [id number] Yes Take 1 tablet (5 mg total) by mouth every 4 (four) hours as needed for severe pain (pain score 7-10). West Bali, PA-C Taking Active   polyethylene glycol (MIRALAX / GLYCOLAX) 17 g packet [id number] Yes Take 17 g by mouth daily as needed for mild constipation. West Bali, PA-C Taking Active   Probiotic Product (PROBIOTIC DAILY PO) [id number] Yes Take 1 tablet by mouth daily. [provider] Taking Active Self, Pharmacy Records  propranolol (INDERAL) 20 MG tablet [id number] Yes TAKE 1 TABLET BY MOUTH 2 TIMES DAILY AS NEEDED.  Patient taking differently: Take 40 mg by mouth daily in the afternoon.   Pincus Sanes, MD Taking Active Self, Pharmacy Records  rosuvastatin (CRESTOR) 10 MG tablet [id number] Yes Take 1 tablet (10 mg total) by mouth daily. Pincus Sanes, MD Taking Active Self, Pharmacy Records  VITAMIN D, CHOLECALCIFEROL, PO [id number] Yes Take 1 tablet by mouth daily. [provider] Taking Active Self, Pharmacy Records            Home Care and Equipment/Supplies: Were Home Health Services Ordered?: Yes Name of Home Health Agency:: [facility] Has Agency set up a time to come to your home?: Yes First Home Health Visit Date: 10/28/23 Any new equipment or medical supplies ordered?: Yes  Name of Medical supply agency?: [facility] Were you able to get the equipment/medical supplies?: Yes Do you have any questions related to the use of the equipment/supplies?: No  Functional Questionnaire: Do you need assistance with bathing/showering or dressing?:  Yes Do you need assistance with meal preparation?: Yes Do you need assistance with eating?: No Do you have difficulty maintaining continence: No Do you need assistance with getting out of bed/getting out of a chair/moving?: No Do you have difficulty managing or taking your medications?: No  Follow up appointments reviewed: PCP Follow-up appointment confirmed?: No MD Provider Line Number:[phone] Given:  (patient will call for appt) Specialist Hospital Follow-up appointment confirmed?: No Reason Specialist Follow-Up Not Confirmed: Patient has Specialist Provider Number and will Call for Appointment (per patient will will follow up with that) Do you need transportation to your follow-up appointment?: No Do you understand care options if your condition(s) worsen?: Yes-patient verbalized understanding  SDOH Interventions Today    Flowsheet Row Most Recent Value  SDOH Interventions   Food Insecurity Interventions Intervention Not Indicated  Housing Interventions Intervention Not Indicated  Transportation Interventions Intervention Not Indicated, Patient Resources (Friends/Family)  Utilities Interventions Intervention Not Indicated      RN discussed HTA custodial care Patient declined further outreach calls TOC interventions discussed/reviewed: -Discussed/reviewed insurance/health plans benefits -Doctor visit discussed/reviewed -PCP -Doctor visits discussed/reviewed-Specialist -Provided Verbal Education: 30-day TOC program, nutrition, meds & their functions, symptom mgmt., fall/safety measures in the home  Gean Maidens BSN RN Population Health- Transition of Care Team.  [facility] [phone]

## 2023-10-28 NOTE — Telephone Encounter (Signed)
Pt broke her leg last Friday, was in the hospital since then due to her operation she had last Monday and she just got out the Friday before last.   Just a update.. Please advise, Thanks

## 2023-10-28 NOTE — Telephone Encounter (Signed)
Noted - I have seen hosp records

## 2023-11-12 DIAGNOSIS — S72412D Displaced unspecified condyle fracture of lower end of left femur, subsequent encounter for closed fracture with routine healing: Secondary | ICD-10-CM | POA: Diagnosis not present

## 2023-11-12 DIAGNOSIS — S82002D Unspecified fracture of left patella, subsequent encounter for closed fracture with routine healing: Secondary | ICD-10-CM | POA: Diagnosis not present

## 2023-11-24 DIAGNOSIS — S72409A Unspecified fracture of lower end of unspecified femur, initial encounter for closed fracture: Secondary | ICD-10-CM | POA: Diagnosis not present

## 2023-12-03 DIAGNOSIS — S72412D Displaced unspecified condyle fracture of lower end of left femur, subsequent encounter for closed fracture with routine healing: Secondary | ICD-10-CM | POA: Diagnosis not present

## 2023-12-03 DIAGNOSIS — S82002D Unspecified fracture of left patella, subsequent encounter for closed fracture with routine healing: Secondary | ICD-10-CM | POA: Diagnosis not present

## 2023-12-31 DIAGNOSIS — S72412D Displaced unspecified condyle fracture of lower end of left femur, subsequent encounter for closed fracture with routine healing: Secondary | ICD-10-CM | POA: Diagnosis not present

## 2023-12-31 DIAGNOSIS — S82002D Unspecified fracture of left patella, subsequent encounter for closed fracture with routine healing: Secondary | ICD-10-CM | POA: Diagnosis not present

## 2024-01-06 DIAGNOSIS — Z85828 Personal history of other malignant neoplasm of skin: Secondary | ICD-10-CM | POA: Diagnosis not present

## 2024-01-06 DIAGNOSIS — Z85820 Personal history of malignant melanoma of skin: Secondary | ICD-10-CM | POA: Diagnosis not present

## 2024-01-06 DIAGNOSIS — C44519 Basal cell carcinoma of skin of other part of trunk: Secondary | ICD-10-CM | POA: Diagnosis not present

## 2024-01-06 DIAGNOSIS — L821 Other seborrheic keratosis: Secondary | ICD-10-CM | POA: Diagnosis not present

## 2024-01-06 DIAGNOSIS — D225 Melanocytic nevi of trunk: Secondary | ICD-10-CM | POA: Diagnosis not present

## 2024-01-06 DIAGNOSIS — L814 Other melanin hyperpigmentation: Secondary | ICD-10-CM | POA: Diagnosis not present

## 2024-01-06 DIAGNOSIS — D1801 Hemangioma of skin and subcutaneous tissue: Secondary | ICD-10-CM | POA: Diagnosis not present

## 2024-01-15 DIAGNOSIS — M25662 Stiffness of left knee, not elsewhere classified: Secondary | ICD-10-CM | POA: Diagnosis not present

## 2024-01-15 DIAGNOSIS — M25562 Pain in left knee: Secondary | ICD-10-CM | POA: Diagnosis not present

## 2024-01-22 DIAGNOSIS — M25662 Stiffness of left knee, not elsewhere classified: Secondary | ICD-10-CM | POA: Diagnosis not present

## 2024-01-22 DIAGNOSIS — M25562 Pain in left knee: Secondary | ICD-10-CM | POA: Diagnosis not present

## 2024-01-24 DIAGNOSIS — M25662 Stiffness of left knee, not elsewhere classified: Secondary | ICD-10-CM | POA: Diagnosis not present

## 2024-01-24 DIAGNOSIS — M25562 Pain in left knee: Secondary | ICD-10-CM | POA: Diagnosis not present

## 2024-01-27 DIAGNOSIS — M25562 Pain in left knee: Secondary | ICD-10-CM | POA: Diagnosis not present

## 2024-01-27 DIAGNOSIS — M25662 Stiffness of left knee, not elsewhere classified: Secondary | ICD-10-CM | POA: Diagnosis not present

## 2024-01-28 DIAGNOSIS — Z85828 Personal history of other malignant neoplasm of skin: Secondary | ICD-10-CM | POA: Diagnosis not present

## 2024-01-28 DIAGNOSIS — C44519 Basal cell carcinoma of skin of other part of trunk: Secondary | ICD-10-CM | POA: Diagnosis not present

## 2024-01-30 DIAGNOSIS — M25662 Stiffness of left knee, not elsewhere classified: Secondary | ICD-10-CM | POA: Diagnosis not present

## 2024-01-30 DIAGNOSIS — M25562 Pain in left knee: Secondary | ICD-10-CM | POA: Diagnosis not present

## 2024-02-05 DIAGNOSIS — M25662 Stiffness of left knee, not elsewhere classified: Secondary | ICD-10-CM | POA: Diagnosis not present

## 2024-02-05 DIAGNOSIS — M25562 Pain in left knee: Secondary | ICD-10-CM | POA: Diagnosis not present

## 2024-02-07 DIAGNOSIS — M25662 Stiffness of left knee, not elsewhere classified: Secondary | ICD-10-CM | POA: Diagnosis not present

## 2024-02-07 DIAGNOSIS — M25562 Pain in left knee: Secondary | ICD-10-CM | POA: Diagnosis not present

## 2024-02-10 DIAGNOSIS — M25562 Pain in left knee: Secondary | ICD-10-CM | POA: Diagnosis not present

## 2024-02-10 DIAGNOSIS — M25662 Stiffness of left knee, not elsewhere classified: Secondary | ICD-10-CM | POA: Diagnosis not present

## 2024-02-14 DIAGNOSIS — M25662 Stiffness of left knee, not elsewhere classified: Secondary | ICD-10-CM | POA: Diagnosis not present

## 2024-02-14 DIAGNOSIS — M25562 Pain in left knee: Secondary | ICD-10-CM | POA: Diagnosis not present

## 2024-02-17 DIAGNOSIS — M25562 Pain in left knee: Secondary | ICD-10-CM | POA: Diagnosis not present

## 2024-02-17 DIAGNOSIS — M25662 Stiffness of left knee, not elsewhere classified: Secondary | ICD-10-CM | POA: Diagnosis not present

## 2024-02-20 DIAGNOSIS — M25662 Stiffness of left knee, not elsewhere classified: Secondary | ICD-10-CM | POA: Diagnosis not present

## 2024-02-20 DIAGNOSIS — M25562 Pain in left knee: Secondary | ICD-10-CM | POA: Diagnosis not present

## 2024-02-21 DIAGNOSIS — M9906 Segmental and somatic dysfunction of lower extremity: Secondary | ICD-10-CM | POA: Diagnosis not present

## 2024-02-21 DIAGNOSIS — M9905 Segmental and somatic dysfunction of pelvic region: Secondary | ICD-10-CM | POA: Diagnosis not present

## 2024-02-21 DIAGNOSIS — M79662 Pain in left lower leg: Secondary | ICD-10-CM | POA: Diagnosis not present

## 2024-02-21 DIAGNOSIS — M25552 Pain in left hip: Secondary | ICD-10-CM | POA: Diagnosis not present

## 2024-02-21 DIAGNOSIS — M9904 Segmental and somatic dysfunction of sacral region: Secondary | ICD-10-CM | POA: Diagnosis not present

## 2024-02-21 DIAGNOSIS — M79605 Pain in left leg: Secondary | ICD-10-CM | POA: Diagnosis not present

## 2024-02-24 DIAGNOSIS — M25562 Pain in left knee: Secondary | ICD-10-CM | POA: Diagnosis not present

## 2024-02-24 DIAGNOSIS — M25662 Stiffness of left knee, not elsewhere classified: Secondary | ICD-10-CM | POA: Diagnosis not present

## 2024-02-25 DIAGNOSIS — S72412D Displaced unspecified condyle fracture of lower end of left femur, subsequent encounter for closed fracture with routine healing: Secondary | ICD-10-CM | POA: Diagnosis not present

## 2024-02-25 DIAGNOSIS — S82002D Unspecified fracture of left patella, subsequent encounter for closed fracture with routine healing: Secondary | ICD-10-CM | POA: Diagnosis not present

## 2024-02-27 DIAGNOSIS — M25562 Pain in left knee: Secondary | ICD-10-CM | POA: Diagnosis not present

## 2024-02-27 DIAGNOSIS — M25662 Stiffness of left knee, not elsewhere classified: Secondary | ICD-10-CM | POA: Diagnosis not present

## 2024-03-02 DIAGNOSIS — M25662 Stiffness of left knee, not elsewhere classified: Secondary | ICD-10-CM | POA: Diagnosis not present

## 2024-03-02 DIAGNOSIS — M25562 Pain in left knee: Secondary | ICD-10-CM | POA: Diagnosis not present

## 2024-03-04 DIAGNOSIS — M25662 Stiffness of left knee, not elsewhere classified: Secondary | ICD-10-CM | POA: Diagnosis not present

## 2024-03-04 DIAGNOSIS — M25562 Pain in left knee: Secondary | ICD-10-CM | POA: Diagnosis not present

## 2024-03-10 DIAGNOSIS — M25562 Pain in left knee: Secondary | ICD-10-CM | POA: Diagnosis not present

## 2024-03-10 DIAGNOSIS — M25662 Stiffness of left knee, not elsewhere classified: Secondary | ICD-10-CM | POA: Diagnosis not present

## 2024-03-12 DIAGNOSIS — M25562 Pain in left knee: Secondary | ICD-10-CM | POA: Diagnosis not present

## 2024-03-12 DIAGNOSIS — M25662 Stiffness of left knee, not elsewhere classified: Secondary | ICD-10-CM | POA: Diagnosis not present

## 2024-03-16 DIAGNOSIS — M25662 Stiffness of left knee, not elsewhere classified: Secondary | ICD-10-CM | POA: Diagnosis not present

## 2024-03-16 DIAGNOSIS — M25562 Pain in left knee: Secondary | ICD-10-CM | POA: Diagnosis not present

## 2024-03-19 DIAGNOSIS — M25562 Pain in left knee: Secondary | ICD-10-CM | POA: Diagnosis not present

## 2024-03-19 DIAGNOSIS — M25662 Stiffness of left knee, not elsewhere classified: Secondary | ICD-10-CM | POA: Diagnosis not present

## 2024-03-20 DIAGNOSIS — M62838 Other muscle spasm: Secondary | ICD-10-CM | POA: Diagnosis not present

## 2024-03-20 DIAGNOSIS — M9904 Segmental and somatic dysfunction of sacral region: Secondary | ICD-10-CM | POA: Diagnosis not present

## 2024-03-20 DIAGNOSIS — M79605 Pain in left leg: Secondary | ICD-10-CM | POA: Diagnosis not present

## 2024-03-20 DIAGNOSIS — M9906 Segmental and somatic dysfunction of lower extremity: Secondary | ICD-10-CM | POA: Diagnosis not present

## 2024-03-20 DIAGNOSIS — M79662 Pain in left lower leg: Secondary | ICD-10-CM | POA: Diagnosis not present

## 2024-03-20 DIAGNOSIS — M9905 Segmental and somatic dysfunction of pelvic region: Secondary | ICD-10-CM | POA: Diagnosis not present

## 2024-03-24 DIAGNOSIS — M25562 Pain in left knee: Secondary | ICD-10-CM | POA: Diagnosis not present

## 2024-03-24 DIAGNOSIS — M25662 Stiffness of left knee, not elsewhere classified: Secondary | ICD-10-CM | POA: Diagnosis not present

## 2024-03-27 DIAGNOSIS — M25662 Stiffness of left knee, not elsewhere classified: Secondary | ICD-10-CM | POA: Diagnosis not present

## 2024-03-27 DIAGNOSIS — M25562 Pain in left knee: Secondary | ICD-10-CM | POA: Diagnosis not present

## 2024-03-31 DIAGNOSIS — M25562 Pain in left knee: Secondary | ICD-10-CM | POA: Diagnosis not present

## 2024-03-31 DIAGNOSIS — M25662 Stiffness of left knee, not elsewhere classified: Secondary | ICD-10-CM | POA: Diagnosis not present

## 2024-04-03 DIAGNOSIS — M9906 Segmental and somatic dysfunction of lower extremity: Secondary | ICD-10-CM | POA: Diagnosis not present

## 2024-04-03 DIAGNOSIS — M79651 Pain in right thigh: Secondary | ICD-10-CM | POA: Diagnosis not present

## 2024-04-03 DIAGNOSIS — M9905 Segmental and somatic dysfunction of pelvic region: Secondary | ICD-10-CM | POA: Diagnosis not present

## 2024-04-03 DIAGNOSIS — M25561 Pain in right knee: Secondary | ICD-10-CM | POA: Diagnosis not present

## 2024-04-03 DIAGNOSIS — M25571 Pain in right ankle and joints of right foot: Secondary | ICD-10-CM | POA: Diagnosis not present

## 2024-04-03 DIAGNOSIS — M9904 Segmental and somatic dysfunction of sacral region: Secondary | ICD-10-CM | POA: Diagnosis not present

## 2024-04-03 DIAGNOSIS — M62838 Other muscle spasm: Secondary | ICD-10-CM | POA: Diagnosis not present

## 2024-04-03 DIAGNOSIS — M25551 Pain in right hip: Secondary | ICD-10-CM | POA: Diagnosis not present

## 2024-04-07 DIAGNOSIS — M25662 Stiffness of left knee, not elsewhere classified: Secondary | ICD-10-CM | POA: Diagnosis not present

## 2024-04-07 DIAGNOSIS — M25562 Pain in left knee: Secondary | ICD-10-CM | POA: Diagnosis not present

## 2024-04-09 DIAGNOSIS — M25562 Pain in left knee: Secondary | ICD-10-CM | POA: Diagnosis not present

## 2024-04-09 DIAGNOSIS — M25662 Stiffness of left knee, not elsewhere classified: Secondary | ICD-10-CM | POA: Diagnosis not present

## 2024-04-14 DIAGNOSIS — M25662 Stiffness of left knee, not elsewhere classified: Secondary | ICD-10-CM | POA: Diagnosis not present

## 2024-04-14 DIAGNOSIS — M25562 Pain in left knee: Secondary | ICD-10-CM | POA: Diagnosis not present

## 2024-04-16 DIAGNOSIS — M79662 Pain in left lower leg: Secondary | ICD-10-CM | POA: Diagnosis not present

## 2024-04-16 DIAGNOSIS — M25562 Pain in left knee: Secondary | ICD-10-CM | POA: Diagnosis not present

## 2024-04-16 DIAGNOSIS — M62838 Other muscle spasm: Secondary | ICD-10-CM | POA: Diagnosis not present

## 2024-04-16 DIAGNOSIS — M9905 Segmental and somatic dysfunction of pelvic region: Secondary | ICD-10-CM | POA: Diagnosis not present

## 2024-04-16 DIAGNOSIS — M79652 Pain in left thigh: Secondary | ICD-10-CM | POA: Diagnosis not present

## 2024-04-16 DIAGNOSIS — M9904 Segmental and somatic dysfunction of sacral region: Secondary | ICD-10-CM | POA: Diagnosis not present

## 2024-04-16 DIAGNOSIS — Z96652 Presence of left artificial knee joint: Secondary | ICD-10-CM | POA: Diagnosis not present

## 2024-04-16 DIAGNOSIS — M9906 Segmental and somatic dysfunction of lower extremity: Secondary | ICD-10-CM | POA: Diagnosis not present

## 2024-04-17 DIAGNOSIS — M25662 Stiffness of left knee, not elsewhere classified: Secondary | ICD-10-CM | POA: Diagnosis not present

## 2024-04-17 DIAGNOSIS — M25562 Pain in left knee: Secondary | ICD-10-CM | POA: Diagnosis not present

## 2024-04-21 DIAGNOSIS — M25562 Pain in left knee: Secondary | ICD-10-CM | POA: Diagnosis not present

## 2024-04-21 DIAGNOSIS — M25662 Stiffness of left knee, not elsewhere classified: Secondary | ICD-10-CM | POA: Diagnosis not present

## 2024-04-28 DIAGNOSIS — M25662 Stiffness of left knee, not elsewhere classified: Secondary | ICD-10-CM | POA: Diagnosis not present

## 2024-04-28 DIAGNOSIS — M25562 Pain in left knee: Secondary | ICD-10-CM | POA: Diagnosis not present

## 2024-04-30 DIAGNOSIS — M25662 Stiffness of left knee, not elsewhere classified: Secondary | ICD-10-CM | POA: Diagnosis not present

## 2024-04-30 DIAGNOSIS — M25562 Pain in left knee: Secondary | ICD-10-CM | POA: Diagnosis not present

## 2024-05-05 DIAGNOSIS — M25562 Pain in left knee: Secondary | ICD-10-CM | POA: Diagnosis not present

## 2024-05-05 DIAGNOSIS — M25662 Stiffness of left knee, not elsewhere classified: Secondary | ICD-10-CM | POA: Diagnosis not present

## 2024-05-07 DIAGNOSIS — M25562 Pain in left knee: Secondary | ICD-10-CM | POA: Diagnosis not present

## 2024-05-07 DIAGNOSIS — M25662 Stiffness of left knee, not elsewhere classified: Secondary | ICD-10-CM | POA: Diagnosis not present

## 2024-05-12 DIAGNOSIS — M5459 Other low back pain: Secondary | ICD-10-CM | POA: Diagnosis not present

## 2024-05-12 DIAGNOSIS — M9907 Segmental and somatic dysfunction of upper extremity: Secondary | ICD-10-CM | POA: Diagnosis not present

## 2024-05-12 DIAGNOSIS — M25552 Pain in left hip: Secondary | ICD-10-CM | POA: Diagnosis not present

## 2024-05-12 DIAGNOSIS — M9908 Segmental and somatic dysfunction of rib cage: Secondary | ICD-10-CM | POA: Diagnosis not present

## 2024-05-12 DIAGNOSIS — M25662 Stiffness of left knee, not elsewhere classified: Secondary | ICD-10-CM | POA: Diagnosis not present

## 2024-05-12 DIAGNOSIS — M9904 Segmental and somatic dysfunction of sacral region: Secondary | ICD-10-CM | POA: Diagnosis not present

## 2024-05-12 DIAGNOSIS — M25562 Pain in left knee: Secondary | ICD-10-CM | POA: Diagnosis not present

## 2024-05-12 DIAGNOSIS — M9903 Segmental and somatic dysfunction of lumbar region: Secondary | ICD-10-CM | POA: Diagnosis not present

## 2024-05-12 DIAGNOSIS — M62838 Other muscle spasm: Secondary | ICD-10-CM | POA: Diagnosis not present

## 2024-05-12 DIAGNOSIS — M9906 Segmental and somatic dysfunction of lower extremity: Secondary | ICD-10-CM | POA: Diagnosis not present

## 2024-05-12 DIAGNOSIS — M9905 Segmental and somatic dysfunction of pelvic region: Secondary | ICD-10-CM | POA: Diagnosis not present

## 2024-05-19 DIAGNOSIS — M25562 Pain in left knee: Secondary | ICD-10-CM | POA: Diagnosis not present

## 2024-05-19 DIAGNOSIS — M25662 Stiffness of left knee, not elsewhere classified: Secondary | ICD-10-CM | POA: Diagnosis not present

## 2024-05-21 DIAGNOSIS — M25562 Pain in left knee: Secondary | ICD-10-CM | POA: Diagnosis not present

## 2024-05-21 DIAGNOSIS — M25662 Stiffness of left knee, not elsewhere classified: Secondary | ICD-10-CM | POA: Diagnosis not present

## 2024-05-26 DIAGNOSIS — M25662 Stiffness of left knee, not elsewhere classified: Secondary | ICD-10-CM | POA: Diagnosis not present

## 2024-05-26 DIAGNOSIS — M25562 Pain in left knee: Secondary | ICD-10-CM | POA: Diagnosis not present

## 2024-06-02 DIAGNOSIS — M25562 Pain in left knee: Secondary | ICD-10-CM | POA: Diagnosis not present

## 2024-06-02 DIAGNOSIS — M25662 Stiffness of left knee, not elsewhere classified: Secondary | ICD-10-CM | POA: Diagnosis not present

## 2024-06-04 DIAGNOSIS — M25562 Pain in left knee: Secondary | ICD-10-CM | POA: Diagnosis not present

## 2024-06-04 DIAGNOSIS — M25662 Stiffness of left knee, not elsewhere classified: Secondary | ICD-10-CM | POA: Diagnosis not present

## 2024-06-07 ENCOUNTER — Other Ambulatory Visit: Payer: Self-pay | Admitting: Internal Medicine

## 2024-06-09 ENCOUNTER — Other Ambulatory Visit: Payer: Self-pay | Admitting: Internal Medicine

## 2024-06-09 DIAGNOSIS — M25562 Pain in left knee: Secondary | ICD-10-CM | POA: Diagnosis not present

## 2024-06-09 DIAGNOSIS — M25662 Stiffness of left knee, not elsewhere classified: Secondary | ICD-10-CM | POA: Diagnosis not present

## 2024-06-12 DIAGNOSIS — M6281 Muscle weakness (generalized): Secondary | ICD-10-CM | POA: Diagnosis not present

## 2024-06-12 DIAGNOSIS — M9903 Segmental and somatic dysfunction of lumbar region: Secondary | ICD-10-CM | POA: Diagnosis not present

## 2024-06-12 DIAGNOSIS — M9905 Segmental and somatic dysfunction of pelvic region: Secondary | ICD-10-CM | POA: Diagnosis not present

## 2024-06-12 DIAGNOSIS — M9904 Segmental and somatic dysfunction of sacral region: Secondary | ICD-10-CM | POA: Diagnosis not present

## 2024-06-12 DIAGNOSIS — M9906 Segmental and somatic dysfunction of lower extremity: Secondary | ICD-10-CM | POA: Diagnosis not present

## 2024-06-12 DIAGNOSIS — M79662 Pain in left lower leg: Secondary | ICD-10-CM | POA: Diagnosis not present

## 2024-06-12 DIAGNOSIS — M9908 Segmental and somatic dysfunction of rib cage: Secondary | ICD-10-CM | POA: Diagnosis not present

## 2024-06-12 DIAGNOSIS — M25562 Pain in left knee: Secondary | ICD-10-CM | POA: Diagnosis not present

## 2024-06-18 DIAGNOSIS — M25562 Pain in left knee: Secondary | ICD-10-CM | POA: Diagnosis not present

## 2024-06-18 DIAGNOSIS — M25662 Stiffness of left knee, not elsewhere classified: Secondary | ICD-10-CM | POA: Diagnosis not present

## 2024-06-23 DIAGNOSIS — M25562 Pain in left knee: Secondary | ICD-10-CM | POA: Diagnosis not present

## 2024-06-23 DIAGNOSIS — M25662 Stiffness of left knee, not elsewhere classified: Secondary | ICD-10-CM | POA: Diagnosis not present

## 2024-06-29 ENCOUNTER — Encounter: Payer: Self-pay | Admitting: Internal Medicine

## 2024-06-29 NOTE — Patient Instructions (Addendum)
 Shingles vaccine #2 today   Blood work was ordered.       Medications changes include :   azelastine  nasal spray    Return in about 1 year (around 06/30/2025) for Physical Exam.    Health Maintenance, Female Adopting a healthy lifestyle and getting preventive care are important in promoting health and wellness. Ask your health care provider about: The right schedule for you to have regular tests and exams. Things you can do on your own to prevent diseases and keep yourself healthy. What should I know about diet, weight, and exercise? Eat a healthy diet  Eat a diet that includes plenty of vegetables, fruits, low-fat dairy products, and lean protein. Do not eat a lot of foods that are high in solid fats, added sugars, or sodium. Maintain a healthy weight Body mass index (BMI) is used to identify weight problems. It estimates body fat based on height and weight. Your health care provider can help determine your BMI and help you achieve or maintain a healthy weight. Get regular exercise Get regular exercise. This is one of the most important things you can do for your health. Most adults should: Exercise for at least 150 minutes each week. The exercise should increase your heart rate and make you sweat (moderate-intensity exercise). Do strengthening exercises at least twice a week. This is in addition to the moderate-intensity exercise. Spend less time sitting. Even light physical activity can be beneficial. Watch cholesterol and blood lipids Have your blood tested for lipids and cholesterol at 75 years of age, then have this test every 5 years. Have your cholesterol levels checked more often if: Your lipid or cholesterol levels are high. You are older than 75 years of age. You are at high risk for heart disease. What should I know about cancer screening? Depending on your health history and family history, you may need to have cancer screening at various ages. This may include  screening for: Breast cancer. Cervical cancer. Colorectal cancer. Skin cancer. Lung cancer. What should I know about heart disease, diabetes, and high blood pressure? Blood pressure and heart disease High blood pressure causes heart disease and increases the risk of stroke. This is more likely to develop in people who have high blood pressure readings or are overweight. Have your blood pressure checked: Every 3-5 years if you are 37-55 years of age. Every year if you are 43 years old or older. Diabetes Have regular diabetes screenings. This checks your fasting blood sugar level. Have the screening done: Once every three years after age 20 if you are at a normal weight and have a low risk for diabetes. More often and at a younger age if you are overweight or have a high risk for diabetes. What should I know about preventing infection? Hepatitis B If you have a higher risk for hepatitis B, you should be screened for this virus. Talk with your health care provider to find out if you are at risk for hepatitis B infection. Hepatitis C Testing is recommended for: Everyone born from [birth year] through 1967. Anyone with known risk factors for hepatitis C. Sexually transmitted infections (STIs) Get screened for STIs, including gonorrhea and chlamydia, if: You are sexually active and are younger than 75 years of age. You are older than 75 years of age and your health care provider tells you that you are at risk for this type of infection. Your sexual activity has changed since you were last screened, and you are at increased  risk for chlamydia or gonorrhea. Ask your health care provider if you are at risk. Ask your health care provider about whether you are at high risk for HIV. Your health care provider may recommend a prescription medicine to help prevent HIV infection. If you choose to take medicine to prevent HIV, you should first get tested for HIV. You should then be tested every 3 months for as  long as you are taking the medicine. Pregnancy If you are about to stop having your period (premenopausal) and you may become pregnant, seek counseling before you get pregnant. Take 400 to 800 micrograms (mcg) of folic acid every day if you become pregnant. Ask for birth control (contraception) if you want to prevent pregnancy. Osteoporosis and menopause Osteoporosis is a disease in which the bones lose minerals and strength with aging. This can result in bone fractures. If you are 1 years old or older, or if you are at risk for osteoporosis and fractures, ask your health care provider if you should: Be screened for bone loss. Take a calcium or vitamin D  supplement to lower your risk of fractures. Be given hormone replacement therapy (HRT) to treat symptoms of menopause. Follow these instructions at home: Alcohol  use Do not drink alcohol  if: Your health care provider tells you not to drink. You are pregnant, may be pregnant, or are planning to become pregnant. If you drink alcohol : Limit how much you have to: 0-1 drink a day. Know how much alcohol  is in your drink. In the [place] one drink equals one 12 oz bottle of beer (355 mL), one 5 oz glass of wine (148 mL), or one 1 oz glass of hard liquor (44 mL). Lifestyle Do not use any products that contain nicotine or tobacco. These products include cigarettes, chewing tobacco, and vaping devices, such as e-cigarettes. If you need help quitting, ask your health care provider. Do not use street drugs. Do not share needles. Ask your health care provider for help if you need support or information about quitting drugs. General instructions Schedule regular health, dental, and eye exams. Stay current with your vaccines. Tell your health care provider if: You often feel depressed. You have ever been abused or do not feel safe at home. Summary Adopting a healthy lifestyle and getting preventive care are important in promoting health and  wellness. Follow your health care provider's instructions about healthy diet, exercising, and getting tested or screened for diseases. Follow your health care provider's instructions on monitoring your cholesterol and blood pressure. This information is not intended to replace advice given to you by your health care provider. Make sure you discuss any questions you have with your health care provider. Document Revised: 03/20/2021 Document Reviewed: 03/20/2021 Elsevier Patient Education  2024 [employer].

## 2024-06-29 NOTE — Progress Notes (Unsigned)
 Subjective:    Patient ID: Heather Rivera, female    DOB: [birth year], 73 y.o.   MRN: [id number]      HPI Shruti is here for a Physical exam and her chronic medical problems.   Still recovering from femur fx - fell 10/2023 - not happy with decreased strength and limping that she still has.    She is doing PT 2 / week, dry needling, shock wave therapy.  She walks on a treadmill.  Trying to get stronger - using ankle weights.  She has a trip planned for [place] in October and wants to make sure she can walk a lot and enjoy her trip.   Skin biopsy on back - concerned it is not healing  Medications and allergies reviewed with patient and updated if appropriate.  Current Outpatient Medications on File Prior to Visit  Medication Sig Dispense Refill   B Complex Vitamins (VITAMIN B COMPLEX PO) Vitamin B Complex     fluticasone  (FLONASE ) 50 MCG/ACT nasal spray Place 2 sprays into both nostrils daily. 16 g 6   Magnesium  100 MG CAPS Take 200 mg by mouth daily.     montelukast  (SINGULAIR ) 10 MG tablet TAKE 1 TABLET BY MOUTH EVERYDAY AT BEDTIME (Patient taking differently: Take 10 mg by mouth at bedtime.) 90 tablet 3   Probiotic Product (PROBIOTIC DAILY PO) Take 1 tablet by mouth daily.     propranolol  (INDERAL ) 20 MG tablet TAKE 1 TABLET BY MOUTH 2 TIMES DAILY AS NEEDED. 180 tablet 1   rosuvastatin  (CRESTOR ) 10 MG tablet TAKE 1 TABLET BY MOUTH EVERY DAY 90 tablet 3   No current facility-administered medications on file prior to visit.    Review of Systems  Constitutional:  Negative for fever.  HENT:  Positive for postnasal drip.   Eyes:  Negative for visual disturbance.  Respiratory:  Positive for cough (allergy related). Negative for shortness of breath and wheezing.   Cardiovascular:  Negative for chest pain, palpitations and leg swelling.  Gastrointestinal:  Positive for constipation (occ). Negative for abdominal pain, blood in stool and diarrhea.       No gerd  Genitourinary:   Negative for dysuria.  Musculoskeletal:  Positive for arthralgias (left knee ache). Negative for back pain.       Muscle aches in left hip, upper leg and buttock region  Skin:  Negative for rash.  Neurological:  Negative for light-headedness and headaches.  Psychiatric/Behavioral:  Positive for dysphoric mood (sometimes) and sleep disturbance. The patient is nervous/anxious (taking ashwaganda).        Objective:   Vitals:   06/30/24 0847  BP: 120/82  Pulse: 60  Temp: 98 F (36.7 C)  SpO2: 98%   Filed Weights   06/30/24 0847  Weight: 170 lb (77.1 kg)   Body mass index is 27.86 kg/m.  BP Readings from Last 3 Encounters:  06/30/24 120/82  10/25/23 136/78  06/18/23 136/80    Wt Readings from Last 3 Encounters:  06/30/24 170 lb (77.1 kg)  10/21/23 170 lb (77.1 kg)  06/18/23 170 lb (77.1 kg)       Physical Exam Constitutional: She appears well-developed and well-nourished. No distress.  HENT:  Head: Normocephalic and atraumatic.  Right Ear: External ear normal. Normal ear canal and TM Left Ear: External ear normal.  Normal ear canal and TM Mouth/Throat: Oropharynx is clear and moist.  Eyes: Conjunctivae normal.  Neck: Neck supple. No tracheal deviation present. No thyromegaly present.  No carotid bruit  Cardiovascular: Normal rate, regular rhythm and normal heart sounds.   No murmur heard.  No edema. Pulmonary/Chest: Effort normal and breath sounds normal. No respiratory distress. She has no wheezes. She has no rales.  Breast: deferred   Abdominal: Soft. She exhibits no distension. There is no tenderness.  Lymphadenopathy: She has no cervical adenopathy.  Skin: Skin is warm and dry. She is not diaphoretic.  Healing skin biopsy right mid back-wound is closed and just mildly erythematous consistent with normal healing Psychiatric: She has a normal mood and affect. Her behavior is normal.     Lab Results  Component Value Date   WBC 5.6 10/24/2023   HGB 10.5 (L)  10/24/2023   HCT 30.7 (L) 10/24/2023   PLT 248 10/24/2023   GLUCOSE 112 (H) 10/24/2023   CHOL 310 (H) 06/19/2023   TRIG 53.0 06/19/2023   HDL 123.50 06/19/2023   LDLCALC 176 (H) 06/19/2023   ALT 19 06/19/2023   AST 17 06/19/2023   NA 136 10/24/2023   K 3.9 10/24/2023   CL 102 10/24/2023   CREATININE 0.68 10/24/2023   BUN 13 10/24/2023   CO2 27 10/24/2023   TSH 2.24 06/19/2023   HGBA1C 5.7 06/19/2023    The ASCVD Risk score (Arnett DK, et al., 2019) failed to calculate for the following reasons:   The valid HDL cholesterol range is 20 to 100 mg/dL      Assessment & Plan:   Physical exam: Screening blood work  ordered Exercise  PT - regular on treadmil Weight  is good - overweight Substance abuse  none   Reviewed recommended immunizations.   Health Maintenance  Topic Date Due   Medicare Annual Wellness (AWV)  06/06/2019   INFLUENZA VACCINE  06/12/2024   COVID-19 Vaccine (6 - 2024-25 season) 07/16/2024 (Originally 07/14/2023)   DTaP/Tdap/Td (2 - Tdap) 06/30/2025 (Originally 11/12/2018)   Colonoscopy  06/30/2025 (Originally 04/22/1994)   DEXA SCAN  12/05/2024   Pneumococcal Vaccine: 50+ Years  Completed   Zoster Vaccines- Shingrix  Completed   HPV VACCINES  Aged Out   Meningococcal B Vaccine  Aged Out   Hepatitis C Screening  Discontinued          See Problem List for Assessment and Plan of chronic medical problems.

## 2024-06-30 ENCOUNTER — Ambulatory Visit (INDEPENDENT_AMBULATORY_CARE_PROVIDER_SITE_OTHER): Payer: PPO | Admitting: Internal Medicine

## 2024-06-30 ENCOUNTER — Ambulatory Visit: Payer: Self-pay | Admitting: Internal Medicine

## 2024-06-30 VITALS — BP 120/82 | HR 60 | Temp 98.0°F | Ht 65.5 in | Wt 170.0 lb

## 2024-06-30 DIAGNOSIS — M8000XA Age-related osteoporosis with current pathological fracture, unspecified site, initial encounter for fracture: Secondary | ICD-10-CM | POA: Diagnosis not present

## 2024-06-30 DIAGNOSIS — M25662 Stiffness of left knee, not elsewhere classified: Secondary | ICD-10-CM | POA: Diagnosis not present

## 2024-06-30 DIAGNOSIS — J3089 Other allergic rhinitis: Secondary | ICD-10-CM

## 2024-06-30 DIAGNOSIS — G250 Essential tremor: Secondary | ICD-10-CM | POA: Diagnosis not present

## 2024-06-30 DIAGNOSIS — E7849 Other hyperlipidemia: Secondary | ICD-10-CM

## 2024-06-30 DIAGNOSIS — R7303 Prediabetes: Secondary | ICD-10-CM | POA: Diagnosis not present

## 2024-06-30 DIAGNOSIS — Z0000 Encounter for general adult medical examination without abnormal findings: Secondary | ICD-10-CM

## 2024-06-30 DIAGNOSIS — M8589 Other specified disorders of bone density and structure, multiple sites: Secondary | ICD-10-CM | POA: Diagnosis not present

## 2024-06-30 DIAGNOSIS — F5101 Primary insomnia: Secondary | ICD-10-CM | POA: Diagnosis not present

## 2024-06-30 DIAGNOSIS — M25562 Pain in left knee: Secondary | ICD-10-CM | POA: Diagnosis not present

## 2024-06-30 DIAGNOSIS — Z85820 Personal history of malignant melanoma of skin: Secondary | ICD-10-CM

## 2024-06-30 LAB — LIPID PANEL
Cholesterol: 220 mg/dL — ABNORMAL HIGH (ref 0–200)
HDL: 108.8 mg/dL (ref >39.00)
LDL Cholesterol: 103 mg/dL — ABNORMAL HIGH (ref 0–99)
NonHDL: 111.4
Total CHOL/HDL Ratio: 2
Triglycerides: 44 mg/dL (ref 0.0–149.0)
VLDL: 8.8 mg/dL (ref 0.0–40.0)

## 2024-06-30 LAB — CBC WITH DIFFERENTIAL/PLATELET
Basophils Absolute: 0.1 K/uL (ref 0.0–0.1)
Basophils Relative: 0.8 % (ref 0.0–3.0)
Eosinophils Absolute: 0.1 K/uL (ref 0.0–0.7)
Eosinophils Relative: 1.7 % (ref 0.0–5.0)
HCT: 41 % (ref 36.0–46.0)
Hemoglobin: 13.7 g/dL (ref 12.0–15.0)
Lymphocytes Relative: 29.7 % (ref 12.0–46.0)
Lymphs Abs: 1.8 K/uL (ref 0.7–4.0)
MCHC: 33.4 g/dL (ref 30.0–36.0)
MCV: 89.8 fl (ref 78.0–100.0)
Monocytes Absolute: 0.5 K/uL (ref 0.1–1.0)
Monocytes Relative: 8.4 % (ref 3.0–12.0)
Neutro Abs: 3.5 K/uL (ref 1.4–7.7)
Neutrophils Relative %: 59.4 % (ref 43.0–77.0)
Platelets: 342 K/uL (ref 150.0–400.0)
RBC: 4.56 Mil/uL (ref 3.87–5.11)
RDW: 14.1 % (ref 11.5–15.5)
WBC: 6 K/uL (ref 4.0–10.5)

## 2024-06-30 LAB — COMPREHENSIVE METABOLIC PANEL WITH GFR
ALT: 15 U/L (ref 0–35)
AST: 15 U/L (ref 0–37)
Albumin: 4.3 g/dL (ref 3.5–5.2)
Alkaline Phosphatase: 37 U/L — ABNORMAL LOW (ref 39–117)
BUN: 14 mg/dL (ref 6–23)
CO2: 29 meq/L (ref 19–32)
Calcium: 9.8 mg/dL (ref 8.4–10.5)
Chloride: 100 meq/L (ref 96–112)
Creatinine, Ser: 0.86 mg/dL (ref 0.40–1.20)
GFR: 66.19 mL/min (ref >60.00)
Glucose, Bld: 111 mg/dL — ABNORMAL HIGH (ref 70–99)
Potassium: 4.5 meq/L (ref 3.5–5.1)
Sodium: 137 meq/L (ref 135–145)
Total Bilirubin: 0.7 mg/dL (ref 0.2–1.2)
Total Protein: 6.9 g/dL (ref 6.0–8.3)

## 2024-06-30 LAB — VITAMIN D 25 HYDROXY (VIT D DEFICIENCY, FRACTURES): VITD: 80.7 ng/mL (ref 30.00–100.00)

## 2024-06-30 LAB — HEMOGLOBIN A1C: Hgb A1c MFr Bld: 6.3 % (ref 4.6–6.5)

## 2024-06-30 LAB — TSH: TSH: 1.64 u[IU]/mL (ref 0.35–5.50)

## 2024-06-30 MED ORDER — ALENDRONATE SODIUM 70 MG PO TABS: 70.0000 mg | ORAL_TABLET | ORAL | 3 refills | Status: DC

## 2024-06-30 MED ORDER — ZOLPIDEM TARTRATE 5 MG PO TABS: 5.0000 mg | ORAL_TABLET | Freq: Every evening | ORAL | 2 refills | Status: AC | PRN

## 2024-06-30 NOTE — Assessment & Plan Note (Signed)
 Chronic Lab Results  Component Value Date   HGBA1C 5.7 06/19/2023   Check a1c, CMP, CBC Low sugar / carb diet Stressed regular exercise

## 2024-06-30 NOTE — Assessment & Plan Note (Addendum)
 Chronic Check TSH, CBC Bilateral hands-right hand is worse than left hand Has seen neurology 07/2022 Continue propranolol  40 mg daily  - can titrate as needed

## 2024-06-30 NOTE — Assessment & Plan Note (Addendum)
 Chronic Controlled Continue singular 10 mg nightly prn, over-the-counter antihistamine, Flonase  daily as needed

## 2024-06-30 NOTE — Assessment & Plan Note (Signed)
 Chronic Lab Results  Component Value Date   LDLCALC 176 (H) 06/19/2023   Regular exercise and healthy diet encouraged Check lipid panel, CMP, TSH, CBC Currently diet controlled

## 2024-06-30 NOTE — Assessment & Plan Note (Signed)
 History of melanoma Follows with dermatology regularly

## 2024-06-30 NOTE — Assessment & Plan Note (Addendum)
 Chronic DEXA up-to-date Osteopenia with high FRAX Continue regular exercise Continue vitamin D  supplementation Does not tolerate calcium  supplementation due to constipation-encouraged high calcium  diet Check vitamin D  level Start alendronate  70 mg once weekly-plan to continue for 5 years

## 2024-06-30 NOTE — Assessment & Plan Note (Addendum)
 Chronic Intermittent Wakes up at 3am at times and can not go back to sleep Ambien  5 mg nightly prn-encouraged to take as infrequently as possible

## 2024-07-03 DIAGNOSIS — M25562 Pain in left knee: Secondary | ICD-10-CM | POA: Diagnosis not present

## 2024-07-03 DIAGNOSIS — M25662 Stiffness of left knee, not elsewhere classified: Secondary | ICD-10-CM | POA: Diagnosis not present

## 2024-07-08 ENCOUNTER — Encounter: Payer: Self-pay | Admitting: Internal Medicine

## 2024-07-09 DIAGNOSIS — M25562 Pain in left knee: Secondary | ICD-10-CM | POA: Diagnosis not present

## 2024-07-09 DIAGNOSIS — M25662 Stiffness of left knee, not elsewhere classified: Secondary | ICD-10-CM | POA: Diagnosis not present

## 2024-07-14 DIAGNOSIS — M9904 Segmental and somatic dysfunction of sacral region: Secondary | ICD-10-CM | POA: Diagnosis not present

## 2024-07-14 DIAGNOSIS — M9905 Segmental and somatic dysfunction of pelvic region: Secondary | ICD-10-CM | POA: Diagnosis not present

## 2024-07-14 DIAGNOSIS — S72402S Unspecified fracture of lower end of left femur, sequela: Secondary | ICD-10-CM | POA: Diagnosis not present

## 2024-07-14 DIAGNOSIS — M8000XA Age-related osteoporosis with current pathological fracture, unspecified site, initial encounter for fracture: Secondary | ICD-10-CM | POA: Diagnosis not present

## 2024-07-14 DIAGNOSIS — M9906 Segmental and somatic dysfunction of lower extremity: Secondary | ICD-10-CM | POA: Diagnosis not present

## 2024-07-15 DIAGNOSIS — M25562 Pain in left knee: Secondary | ICD-10-CM | POA: Diagnosis not present

## 2024-07-15 DIAGNOSIS — M25662 Stiffness of left knee, not elsewhere classified: Secondary | ICD-10-CM | POA: Diagnosis not present

## 2024-07-16 ENCOUNTER — Encounter: Payer: Self-pay | Admitting: Internal Medicine

## 2024-07-17 DIAGNOSIS — M25662 Stiffness of left knee, not elsewhere classified: Secondary | ICD-10-CM | POA: Diagnosis not present

## 2024-07-17 DIAGNOSIS — M25562 Pain in left knee: Secondary | ICD-10-CM | POA: Diagnosis not present

## 2024-07-20 ENCOUNTER — Telehealth: Payer: Self-pay

## 2024-07-20 NOTE — Telephone Encounter (Signed)
 Evenity VOB initiated via [email or web address]  Last Evenity inj:  Next Evenity inj DUE:  [employer]

## 2024-07-21 DIAGNOSIS — M25562 Pain in left knee: Secondary | ICD-10-CM | POA: Diagnosis not present

## 2024-07-21 DIAGNOSIS — M25662 Stiffness of left knee, not elsewhere classified: Secondary | ICD-10-CM | POA: Diagnosis not present

## 2024-07-22 ENCOUNTER — Other Ambulatory Visit (HOSPITAL_COMMUNITY): Payer: Self-pay

## 2024-07-22 NOTE — Telephone Encounter (Addendum)
 Pharmacy PA submitted via Latent. Key: [id number]   DENIED

## 2024-07-22 NOTE — Telephone Encounter (Signed)
   20% Co-insurance, $0 copay, no PA required, no deductible

## 2024-07-22 NOTE — Telephone Encounter (Signed)
 Pt ready for scheduling for EVENITY on or after : 07/22/24  Option# 1 Buy/Bill (Office supplied medication)  Out-of-pocket cost due at time of  office visit: $482.41  Number of injection/visits approved: ---  Primary: [employer] Evenity co-insurance: 20% Admin fee co-insurance: $0  Secondary: --- Evenity co-insurance:  Admin fee co-insurance:   Medical Benefit Details: Date Benefits were checked: 07/22/24 Deductible: NO/ Coinsurance: 20%/ Admin Fee: $0  Prior Auth: N/A PA# Expiration Date:   # of doses approved: ------------------------------------------------------------------------- Option# 2- Med Obtained from pharmacy  Pharmacy benefit: Copay $--- (Paid to pharmacy) Admin Fee: --- (Pay at clinic)  Prior Auth: DENIED PA# Expiration Date:   # of doses approved:  If patient wants fill through the pharmacy benefit please send prescription to: ---, and include estimated need by date in rx notes. Pharmacy will ship medication directly to the office.  Patient NOT eligible for Evenity Copay Card. Copay Card can make patient's cost as little as $25. Link to apply: [email or web address]   This summary of benefits is an estimation of the patient's out-of-pocket cost. Exact cost may very based on individual plan coverage.

## 2024-07-23 NOTE — Telephone Encounter (Signed)
 Evenity is not covered under pharmacy benefits. PA was denied.

## 2024-07-24 DIAGNOSIS — M25562 Pain in left knee: Secondary | ICD-10-CM | POA: Diagnosis not present

## 2024-07-24 DIAGNOSIS — M25662 Stiffness of left knee, not elsewhere classified: Secondary | ICD-10-CM | POA: Diagnosis not present

## 2024-07-28 DIAGNOSIS — M25662 Stiffness of left knee, not elsewhere classified: Secondary | ICD-10-CM | POA: Diagnosis not present

## 2024-07-28 DIAGNOSIS — M25562 Pain in left knee: Secondary | ICD-10-CM | POA: Diagnosis not present

## 2024-07-30 DIAGNOSIS — M25562 Pain in left knee: Secondary | ICD-10-CM | POA: Diagnosis not present

## 2024-07-30 DIAGNOSIS — M25662 Stiffness of left knee, not elsewhere classified: Secondary | ICD-10-CM | POA: Diagnosis not present

## 2024-08-04 DIAGNOSIS — M25662 Stiffness of left knee, not elsewhere classified: Secondary | ICD-10-CM | POA: Diagnosis not present

## 2024-08-04 DIAGNOSIS — M25562 Pain in left knee: Secondary | ICD-10-CM | POA: Diagnosis not present

## 2024-08-11 DIAGNOSIS — M25562 Pain in left knee: Secondary | ICD-10-CM | POA: Diagnosis not present

## 2024-08-11 DIAGNOSIS — M25662 Stiffness of left knee, not elsewhere classified: Secondary | ICD-10-CM | POA: Diagnosis not present

## 2024-08-26 DIAGNOSIS — M25662 Stiffness of left knee, not elsewhere classified: Secondary | ICD-10-CM | POA: Diagnosis not present

## 2024-08-26 DIAGNOSIS — M25562 Pain in left knee: Secondary | ICD-10-CM | POA: Diagnosis not present

## 2024-09-05 ENCOUNTER — Other Ambulatory Visit: Payer: Self-pay | Admitting: Internal Medicine

## 2024-09-15 ENCOUNTER — Other Ambulatory Visit: Payer: Self-pay

## 2024-09-15 DIAGNOSIS — M8000XA Age-related osteoporosis with current pathological fracture, unspecified site, initial encounter for fracture: Secondary | ICD-10-CM

## 2024-09-30 ENCOUNTER — Other Ambulatory Visit

## 2024-09-30 ENCOUNTER — Ambulatory Visit: Payer: Self-pay | Admitting: Internal Medicine

## 2024-09-30 DIAGNOSIS — M8000XA Age-related osteoporosis with current pathological fracture, unspecified site, initial encounter for fracture: Secondary | ICD-10-CM | POA: Diagnosis not present

## 2024-09-30 LAB — COMPREHENSIVE METABOLIC PANEL WITH GFR
ALT: 15 U/L (ref 0–35)
AST: 14 U/L (ref 0–37)
Albumin: 4.2 g/dL (ref 3.5–5.2)
Alkaline Phosphatase: 35 U/L — ABNORMAL LOW (ref 39–117)
BUN: 18 mg/dL (ref 6–23)
CO2: 31 meq/L (ref 19–32)
Calcium: 10 mg/dL (ref 8.4–10.5)
Chloride: 100 meq/L (ref 96–112)
Creatinine, Ser: 0.8 mg/dL (ref 0.40–1.20)
GFR: 72.07 mL/min (ref >60.00)
Glucose, Bld: 105 mg/dL — ABNORMAL HIGH (ref 70–99)
Potassium: 4.5 meq/L (ref 3.5–5.1)
Sodium: 137 meq/L (ref 135–145)
Total Bilirubin: 1 mg/dL (ref 0.2–1.2)
Total Protein: 7 g/dL (ref 6.0–8.3)

## 2024-10-01 ENCOUNTER — Telehealth (HOSPITAL_COMMUNITY): Payer: Self-pay | Admitting: Pharmacy Technician

## 2024-10-01 ENCOUNTER — Other Ambulatory Visit (HOSPITAL_COMMUNITY): Payer: Self-pay | Admitting: Internal Medicine

## 2024-10-01 NOTE — Telephone Encounter (Signed)
 Auth Submission: NO AUTH NEEDED Site of care: [facility] Payer: [employer] Medication & CPT/J Code(s) submitted: Reclast (Zolendronic acid) J3489 Diagnosis Code: M81.0 Route of submission (phone, fax, portal):  Phone # Fax # Auth type: Buy/Bill HB Units/visits requested: 5mg  x 1 dose, every 12 months Reference number:  Approval from: 10/01/2024 to 11/11/24    Dagoberto Armour, CPhT [facility] Phone: [phone] 10/01/2024

## 2024-10-14 ENCOUNTER — Ambulatory Visit: Admitting: Internal Medicine

## 2024-10-14 ENCOUNTER — Encounter: Payer: Self-pay | Admitting: Internal Medicine

## 2024-10-14 VITALS — BP 124/70 | HR 78 | Temp 98.0°F | Ht 65.5 in | Wt 169.0 lb

## 2024-10-14 DIAGNOSIS — R053 Chronic cough: Secondary | ICD-10-CM | POA: Diagnosis not present

## 2024-10-14 NOTE — Assessment & Plan Note (Signed)
 Persistent cough that started several months ago Has had similar cough last year ? Related to PND, neurogenic cough, GERD Has PND Continue Flonase  Restart Claritin -when she was on Claritin  and Singulair  in the past one of them caused muscle cramping so we will just try Claritin  and see if that causes cramping Lungs are clear so we will hold off on chest x-ray  Saw ENT last year  Call if no improvement

## 2024-10-14 NOTE — Progress Notes (Signed)
 Subjective:    Patient ID: Heather Rivera, female    DOB: 1951-01-28, 73 y.o.   MRN: [id number]      HPI Jamilex is here for  Chief Complaint  Patient presents with   Cough    Dry hacky cough (non productive)    Discussed the use of AI scribe software for clinical note transcription with the patient, who gave verbal consent to proceed.  History of Present Illness Heather Rivera is a 73 year old female who presents with a persistent cough.  She has been experiencing a persistent, spasmodic cough since before September.  She had COVID-19 infection the end of September but the cough proceeded that. The cough is primarily located in her chest. It is particularly bothersome in social settings and can be triggered by strong smells like perfume. Temporary relief is achieved with cough drops, but water does not help.  She has a history of being a 'little bit of a cougher' even before the COVID-19 infection, with friends noticing an increase in her coughing. During the COVID-19 infection, she had increased mucus production and nasal congestion. Currently, she has no shortness of breath, wheezing, fever, ear pain, sore throat, sinus pain, or pressure.  She has some PND.    She is using Flonase  but has stopped taking Claritin  and montelukast  due to concerns about leg cramps experienced during a previous hospitalization when she was not taking these medications. She has not experienced heartburn or reflux recently, which she had in the past.  She completed physical therapy in mid-October and is trying to maintain physical activity despite the cold weather, which has made it challenging. She reports feeling 'lazy' and has been stressed due to work and trying to sell her house.     Medications and allergies reviewed with patient and updated if appropriate.  Current Outpatient Medications on File Prior to Visit  Medication Sig Dispense Refill   B Complex Vitamins (VITAMIN B COMPLEX PO) Vitamin  B Complex     fluticasone  (FLONASE ) 50 MCG/ACT nasal spray Place 2 sprays into both nostrils daily. 16 g 6   Magnesium  100 MG CAPS Take 200 mg by mouth daily.     montelukast  (SINGULAIR ) 10 MG tablet TAKE 1 TABLET BY MOUTH EVERYDAY AT BEDTIME 90 tablet 3   Probiotic Product (PROBIOTIC DAILY PO) Take 1 tablet by mouth daily.     propranolol  (INDERAL ) 20 MG tablet TAKE 1 TABLET BY MOUTH 2 TIMES DAILY AS NEEDED. 180 tablet 1   rosuvastatin  (CRESTOR ) 10 MG tablet TAKE 1 TABLET BY MOUTH EVERY DAY 90 tablet 3   zolpidem  (AMBIEN ) 5 MG tablet Take 1 tablet (5 mg total) by mouth at bedtime as needed for sleep. 30 tablet 2   No current facility-administered medications on file prior to visit.    Review of Systems  Constitutional:  Negative for fever.  HENT:  Negative for congestion, ear pain, postnasal drip, sinus pain and sore throat.   Respiratory:  Positive for cough. Negative for chest tightness, shortness of breath and wheezing.        Feels mucus in chest  Gastrointestinal:        No gerd       Objective:   Vitals:   10/14/24 1358  BP: 124/70  Pulse: 78  Temp: 98 F (36.7 C)  SpO2: 99%   BP Readings from Last 3 Encounters:  10/14/24 124/70  06/30/24 120/82  10/25/23 136/78   Wt Readings from Last 3 Encounters:  10/14/24 169 lb (76.7 kg)  06/30/24 170 lb (77.1 kg)  10/21/23 170 lb (77.1 kg)   Body mass index is 27.7 kg/m.    Physical Exam Constitutional:      General: She is not in acute distress.    Appearance: Normal appearance. She is not ill-appearing.  HENT:     Head: Normocephalic and atraumatic.     Right Ear: Tympanic membrane, ear canal and external ear normal.     Left Ear: Tympanic membrane, ear canal and external ear normal.     Mouth/Throat:     Mouth: Mucous membranes are moist.     Pharynx: No oropharyngeal exudate or posterior oropharyngeal erythema.  Eyes:     Conjunctiva/sclera: Conjunctivae normal.  Cardiovascular:     Rate and Rhythm: Normal  rate and regular rhythm.  Pulmonary:     Effort: Pulmonary effort is normal. No respiratory distress.     Breath sounds: Normal breath sounds. No wheezing or rales.  Musculoskeletal:     Cervical back: Neck supple. No tenderness.  Lymphadenopathy:     Cervical: No cervical adenopathy.  Skin:    General: Skin is warm and dry.  Neurological:     Mental Status: She is alert.            Assessment & Plan:    See Problem List for Assessment and Plan of chronic medical problems.

## 2024-10-14 NOTE — Patient Instructions (Addendum)
       Medications changes include :   try the claritin  again, continue flonase        Return if symptoms worsen or fail to improve.

## 2024-10-19 ENCOUNTER — Ambulatory Visit (HOSPITAL_COMMUNITY)
Admission: RE | Admit: 2024-10-19 | Discharge: 2024-10-19 | Disposition: A | Discharge status: Home / Self Care | Source: Ambulatory Visit | Attending: Internal Medicine | Admitting: Internal Medicine

## 2024-10-19 VITALS — BP 127/81 | HR 56 | Temp 98.0°F | Resp 16

## 2024-10-19 DIAGNOSIS — M8000XA Age-related osteoporosis with current pathological fracture, unspecified site, initial encounter for fracture: Secondary | ICD-10-CM | POA: Diagnosis present

## 2024-10-19 MED ORDER — ZOLEDRONIC ACID 5 MG/100ML IV SOLN
INTRAVENOUS | Status: AC
  Filled 2024-10-19: qty 100

## 2024-10-19 MED ORDER — ACETAMINOPHEN 325 MG PO TABS
ORAL_TABLET | ORAL | Status: AC
  Filled 2024-10-19: qty 2

## 2024-10-19 MED ORDER — ACETAMINOPHEN 325 MG PO TABS
650.0000 mg | ORAL_TABLET | Freq: Once | ORAL | Status: AC
  Administered 2024-10-19: 650 mg via ORAL

## 2024-10-19 MED ORDER — ZOLEDRONIC ACID 5 MG/100ML IV SOLN
5.0000 mg | Freq: Once | INTRAVENOUS | Status: AC
  Administered 2024-10-19: 5 mg via INTRAVENOUS

## 2024-10-19 MED ORDER — SODIUM CHLORIDE 0.9 % IV SOLN: INTRAVENOUS | Status: DC

## 2024-10-19 MED ORDER — DIPHENHYDRAMINE HCL 25 MG PO CAPS: 25.0000 mg | ORAL_CAPSULE | Freq: Once | ORAL | Status: DC

## 2024-11-29 ENCOUNTER — Other Ambulatory Visit: Payer: Self-pay | Admitting: Internal Medicine

## 2025-07-01 ENCOUNTER — Encounter: Admitting: Internal Medicine
